# Patient Record
Sex: Female | Born: 1935 | Race: White | Hispanic: No | Marital: Married | Smoking: Never smoker
Health system: Southern US, Community
[De-identification: ages and names within clinical notes are randomized; demographics above are authoritative.]

## PROBLEM LIST (undated history)

## (undated) DIAGNOSIS — F329 Major depressive disorder, single episode, unspecified: Secondary | ICD-10-CM

## (undated) DIAGNOSIS — G43909 Migraine, unspecified, not intractable, without status migrainosus: Secondary | ICD-10-CM

## (undated) DIAGNOSIS — R531 Weakness: Secondary | ICD-10-CM

## (undated) DIAGNOSIS — F419 Anxiety disorder, unspecified: Secondary | ICD-10-CM

## (undated) DIAGNOSIS — R42 Dizziness and giddiness: Secondary | ICD-10-CM

## (undated) DIAGNOSIS — F32A Depression, unspecified: Secondary | ICD-10-CM

## (undated) DIAGNOSIS — IMO0001 Reserved for inherently not codable concepts without codable children: Principal | ICD-10-CM

## (undated) DIAGNOSIS — I519 Heart disease, unspecified: Secondary | ICD-10-CM

## (undated) HISTORY — DX: Dizziness and giddiness: R42

## (undated) HISTORY — DX: Depression, unspecified: F32.A

## (undated) HISTORY — DX: Weakness: R53.1

## (undated) HISTORY — DX: Reserved for inherently not codable concepts without codable children: IMO0001

## (undated) HISTORY — DX: Major depressive disorder, single episode, unspecified: F32.9

## (undated) HISTORY — DX: Migraine, unspecified, not intractable, without status migrainosus: G43.909

## (undated) HISTORY — DX: Heart disease, unspecified: I51.9

## (undated) HISTORY — DX: Anxiety disorder, unspecified: F41.9

## (undated) HISTORY — PX: BACK SURGERY: SHX140

## (undated) HISTORY — PX: NOSE SURGERY: SHX723

---

## 1978-06-09 HISTORY — PX: VAGINAL HYSTERECTOMY: SHX2639

## 1984-06-09 HISTORY — PX: BUNIONECTOMY: SHX129

## 1989-06-09 HISTORY — PX: KNEE SURGERY: SHX244

## 2001-02-12 ENCOUNTER — Ambulatory Visit (HOSPITAL_COMMUNITY): Admission: RE | Admit: 2001-02-12 | Discharge: 2001-02-12 | Payer: Self-pay | Admitting: Neurosurgery

## 2001-02-12 ENCOUNTER — Encounter: Payer: Self-pay | Admitting: Neurosurgery

## 2001-03-23 ENCOUNTER — Encounter: Payer: Self-pay | Admitting: Neurosurgery

## 2001-03-23 ENCOUNTER — Inpatient Hospital Stay (HOSPITAL_COMMUNITY): Admission: RE | Admit: 2001-03-23 | Discharge: 2001-03-25 | Payer: Self-pay | Admitting: Neurosurgery

## 2001-04-29 ENCOUNTER — Encounter: Payer: Self-pay | Admitting: Neurosurgery

## 2001-04-29 ENCOUNTER — Ambulatory Visit (HOSPITAL_COMMUNITY): Admission: RE | Admit: 2001-04-29 | Discharge: 2001-04-29 | Payer: Self-pay | Admitting: Neurosurgery

## 2001-07-23 ENCOUNTER — Encounter: Payer: Self-pay | Admitting: Neurosurgery

## 2001-07-23 ENCOUNTER — Ambulatory Visit (HOSPITAL_COMMUNITY): Admission: RE | Admit: 2001-07-23 | Discharge: 2001-07-23 | Payer: Self-pay | Admitting: Neurosurgery

## 2007-04-20 ENCOUNTER — Ambulatory Visit (HOSPITAL_BASED_OUTPATIENT_CLINIC_OR_DEPARTMENT_OTHER): Admission: RE | Admit: 2007-04-20 | Discharge: 2007-04-20 | Payer: Self-pay | Admitting: Urology

## 2007-04-20 ENCOUNTER — Encounter (INDEPENDENT_AMBULATORY_CARE_PROVIDER_SITE_OTHER): Payer: Self-pay | Admitting: Urology

## 2010-10-22 NOTE — Op Note (Signed)
NAMEDIAMANTE, TRUSZKOWSKI             ACCOUNT NO.:  [id number]   MEDICAL RECORD NO.:  [id number]          PATIENT TYPE:  AMB   LOCATION:  [facility]                         FACILITY:  [facility]   PHYSICIAN:  Jamison Neighbor, M.D.  DATE OF BIRTH:  1932-08-17   DATE OF PROCEDURE:  04/20/2007  DATE OF DISCHARGE:                               OPERATIVE REPORT   SERVICE:  Urology.   PREOPERATIVE DIAGNOSIS:  Interstitial cystitis.   POSTOPERATIVE DIAGNOSIS:  Interstitial cystitis.   PROCEDURE:  1. Cystoscopy.  2. Urethral calibration.  3. Hydrodistention of the bladder.  4. Bladder biopsy with cauterization.  5. Marcaine and Pyridium installation.  6. Marcaine and Kenalog injection.   SURGEON:  Jamison Neighbor, M.D.   ANESTHESIA:  General.   COMPLICATIONS:  None.   DRAINS:  None.   BRIEF HISTORY:  This 78 year old female has a longstanding problems with  what were thought to be bladder infections, although it seems apparent  that this may very well be interstitial cystitis.  The patient had not  had multiple cultures done, and has not had much response to antibiotic  therapy.  We placed on antibiotic prophylaxis, and her urine has not  shown evidence of classic infection, but she still has urgency and  frequency suggesting that she may very well have IC.  The patient has  seen several urologists, and it has been suggested that she might have  this disease, but she never undergone formal testing.   Right now the patient is using antibiotic prophylaxis, as well as  anticholinergics  without much improvement, and for that reason is now  to undergo cystoscopy, and diagnostic hydrodistention.  She understands  the risks and benefits of the procedure, and gave full informed consent.   DESCRIPTION OF PROCEDURE:  After successful induction of general  anesthesia, the patient was placed in the dorsal position, prepped with  Betadine, and draped in the usual sterile fashion.  The patient had no  vault prolapse, but did have a modest cystocele and rectocele although  these and not felt to be particularly symptomatic or important.  The  urethra was of normal caliber accepting a 32-French female urethral  sound with no signs of stenosis or stricture.  The bladder was carefully  inspected.  No tumors or stones could be seen.  Both ureteral orifices  were normal in configuration and location.  Urine seen to come out of  each one was unremarkable.  The bladder was then distended at a pressure  of 170 cm of water for 5 minutes, and the bladder was drained.  Glomerulations could be seen throughout the bladder consistent with IC.  Bladder capacity of 575 mL is almost exactly the same as the average IC  capacity of 550 to 575, and approximately half of a normal bladder  capacity which should be approximate level of 1150 mL.  The patient had  a random biopsy done and the biopsy site was cauterized.   The final inspection showed no evidence of the true Hunter's ulcers, and  no signs of bladder cancer or other irregularities.  The bladder was  drained.  A mixture of Marcaine and Pyridium was left in the bladder.  Marcaine and Kenalog were injected periurethrally.  The patient  tolerated the procedure, and was taken to recovery room in good  condition.      Jamison Neighbor, M.D.  Electronically Signed     RJE/MEDQ  D:  04/20/2007  T:  04/21/2007  Job:  [id number]

## 2010-10-25 NOTE — Op Note (Signed)
[place]. [facility]  Patient:    Yolanda, Franco Visit Number: [id number] MRN: [id number]          Service Type: SUR Location: [id number] Attending Physician:  Donn Pierini Dictated by:   Julio Sicks, M.D. Proc. Date: 03/23/01 Admit Date:  03/23/2001                             Operative Report  PREOPERATIVE DIAGNOSIS:  L4-5 grade 1 degenerative spondylolisthesis with severe stenosis.  POSTOPERATIVE DIAGNOSIS:  L4-5 grade 1 degenerative spondylolisthesis with severe stenosis.  OPERATION PERFORMED:   L4-5 decompressive lumbar laminectomies with foraminotomies.  L4-5 posterior lumbar interbody fusion utilizing tangent wedges and local autograft.  L4-5 posterolateral fusion utilizing pedicle screw instrumentation and local autograft.  SURGEON:  Julio Sicks, M.D.  ASSISTANT:  Donalee Citrin, Montez Hageman.  ANESTHESIA:  General endotracheal.  INDICATIONS FOR PROCEDURE:  Yolanda Franco is a 77 year old female with a history of severe back and bilateral lower extremity pain, left greater than right, failing conservative management.  MRI scanning and CT myelography demonstrate severe stenosis at the L4-5 level secondary to degenerative spondylolisthesis. The patient has been counseled as to her options.  She has decided to proceed with an L4-5 decompression and fusion in hopes of relieving her symptoms.  DESCRIPTION OF PROCEDURE:  The patient was taken to the operating room and placed on the operating table in supine position.  After an adequate level of anesthesia was achieved, the patient was positioned prone onto a Wilson frame and appropriately padded.  The patients lumbar region was shaved and prepped sterilely.  A 10 blade was used to make a linear skin incision overlying the L4-5 interspace.  This was carried down sharply in the midline.  Subperiosteal dissection was performed bilaterally exposing the lamina and facet joints at L4 and L5.  The deep  self-retaining retractor was placed.  Intraoperative fluoroscopy was used and the level was confirmed.  The transverse processes at L4 and L5 were also dissected free.  Decompressive laminectomy at L4-5 was then performed using a Leksell rongeur, Kerrison rongeurs and a high speed drill to completely remove the lamina at L4, completely remove the anterior facets at L4, completely remove the superior facets of L5 and remove the superior one third of the lamina of L5.  All bone was cleaned and used for later autografting.  THe ligamentum flavum was then elevated and resected piecemeal fashion using Kerrison rongeurs.  The underlying thecal sac and exiting L4 and L5 nerve roots were identified and wide foraminotomies were performed along the course of the nerve roots.  Attention was then placed to the interspace.  Epidural venous plexus was then coagulated and cut.  The thecal sac and nerve roots were protected.  Starting first on the left side, the disk was incised with a 15 blade in rectangular fashion.  A wide disk space clean-out was then achieved using pituitary rongeurs and upward angled pituitary rongeurs and Epstein curets.  The procedure was then repeated on the contralateral side again without complication.  The disk space was then sequentially distracted up to 11 mm with an 11 mm distractor left on the patients right side.  With the nerve roots protected, the disk space on the left side was reamed and then cut with a 10 mm tangent chisel.  A 10 mm x  26 mm tangent wedge was then impacted into place and recessed approximately 3 mm  from the posterior cortical surface.  Retractor systems were removed.  X-ray images revealed good position of bone graft with complete reduction in the patients deformity.  The procedure was then repeated on the contralateral side.  Prior to installation of the second wedge, a morselized autograft was impacted into the interspace.  The second wedge was  impacted into place and final x-rays once again revealed good reduction of the patients deformity and good placement of the bone graft.  The pedicles at L4 and L5 were then isolated with fluoroscopic guidance and by using surface landmarks.  Superficial bone overlying the pedicle was removed using high speed drill.  The pedicle was then probed using the pedicle awl.  The pedicle awl tract was probed with a blunt probe and found to be solidly within bone. Each pedicle awl tract was then tapped with a 5.25 mm tap.  Each tapped hole was once again probed and found to be solidly within bone.  At all four locations, 6.75 x 40 mm STRS variable angled pedicle screws were placed.  A short segment of titanium rod was contoured and placed over the screw heads at L4 and L5.  The locking caps were placed on the screw heads.  The locking caps were then engaged in sequential fashion to place the construct under compression.  Final images revealed good position of the bone grafts and hardware in both the lateral and AP planes.  A blunt probe was passed easily along the course of the nerve roots.  There was no evidence of any further compression.  The transverse processes of L4 and L5 were decorticated using the high speed drill.  Morselized autograft was packed posterolaterally for later fusion.  The canal was inspected one final time.  Gelfoam was placed for hemostasis.  A medium Hemovac drain was left in the epidural space.  The wound was then closed in layers with Vicryl sutures.  Steri-Strips and sterile dressing were applied.  There were no apparent complications.  The patient tolerated the procedure well and she returned to the recovery room postoperatively. Gelfoam was placed over the laminotomy defect.  Microscope and retractor system were removed.  Hemostasis in the muscle achieved with electrocautery. The wound was then closed in layers with Vicryl sutures.  Steri-Strips and sterile dressing  were applied.  There were no apparent complications.  The patient tolerated the procedure well and she returned to the recovery room postoperatively.  Dictated by:   Julio Sicks, M.D. Attending Physician:  Donn Pierini DD:  03/23/01 TD:  03/23/01 Job: [id number]

## 2011-03-18 LAB — I-STAT 8, (EC8 V) (CONVERTED LAB)
Acid-Base Excess: 2
BUN: 14
Bicarbonate: 27.6 — ABNORMAL HIGH
Chloride: 106
Glucose, Bld: 91
HCT: 35 — ABNORMAL LOW
Hemoglobin: 11.9 — ABNORMAL LOW
Operator id: 268271
Potassium: 4
Sodium: 141
TCO2: 29
pCO2, Ven: 46.3
pH, Ven: 7.383 — ABNORMAL HIGH

## 2011-09-10 DIAGNOSIS — M069 Rheumatoid arthritis, unspecified: Secondary | ICD-10-CM | POA: Insufficient documentation

## 2012-10-27 ENCOUNTER — Other Ambulatory Visit: Payer: Self-pay | Admitting: *Deleted

## 2012-10-27 ENCOUNTER — Encounter: Payer: Self-pay | Admitting: Neurology

## 2012-10-27 ENCOUNTER — Ambulatory Visit (INDEPENDENT_AMBULATORY_CARE_PROVIDER_SITE_OTHER): Payer: Medicare Other | Admitting: Neurology

## 2012-10-27 VITALS — BP 172/86 | HR 58 | Temp 98.4°F | Ht 65.5 in | Wt 134.0 lb

## 2012-10-27 DIAGNOSIS — R5383 Other fatigue: Secondary | ICD-10-CM

## 2012-10-27 DIAGNOSIS — R259 Unspecified abnormal involuntary movements: Secondary | ICD-10-CM

## 2012-10-27 DIAGNOSIS — R42 Dizziness and giddiness: Secondary | ICD-10-CM

## 2012-10-27 DIAGNOSIS — IMO0001 Reserved for inherently not codable concepts without codable children: Secondary | ICD-10-CM

## 2012-10-27 DIAGNOSIS — R5381 Other malaise: Secondary | ICD-10-CM

## 2012-10-27 DIAGNOSIS — R531 Weakness: Secondary | ICD-10-CM

## 2012-10-27 HISTORY — DX: Dizziness and giddiness: R42

## 2012-10-27 HISTORY — DX: Weakness: R53.1

## 2012-10-27 HISTORY — DX: Reserved for inherently not codable concepts without codable children: IMO0001

## 2012-10-27 NOTE — Patient Instructions (Addendum)
I think overall you are doing fairly well but I do want to suggest a few things today:  Remember to drink plenty of fluid, eat healthy meals and do not skip any meals. Try to eat protein with a every meal and eat a healthy snack such as fruit or nuts in between meals. Try to keep a regular sleep-wake schedule and try to exercise daily, particularly in the form of walking, 20-30 minutes a day, if you can.   As far as your medications are concerned, I would like to suggest no change.    As far as diagnostic testing: MRI brain, carotid Ultrasound, EEG  I would like to see you back in 3 months, sooner if we need to. Please call us with any interim questions, concerns, problems, updates or refill requests.  Please also call us for any test results so we can go over those with you on the phone. Brett Canales is my clinical assistant and will answer any of your questions and relay your messages to me and also relay most of my messages to you.  Our phone number is [phone]. We also have an after hours call service for urgent matters and there is a physician on-call for urgent questions. For any emergencies you know to call 911 or go to the nearest emergency room.

## 2012-10-27 NOTE — Progress Notes (Signed)
Subjective:    Patient ID: Yolanda Franco is a 79 y.o. female.  HPI  Huston Foley, MD, PhD [facility] [address], [address] [address] [place], [place] [address]   Dear Dr. Charlynne Cousins,   I saw your patient, Yolanda Franco, upon your kind request in my neurologic clinic today for initial consultation of her leg weakness and shaking. The patient is unaccompanied today. As you know, Yolanda Franco is a very pleasant 79 year old right-handed woman with an underlying medical history of migraine headaches, food allergies, RA, interstitial cystitis, hyperlipidemia, lumbar spine disease, s/p lumbar spine surgery some 10 years ago, who has been complaining of legs becoming weak with activity and shaky. She states, that she has always been very active and in the past 1-2 months, she has noted, that after she is active for about 1-2 hours, she has had episodes of all over weakness with a drained feeling to the point that she feels her as if her legs will give. She has a cardiologist for heart valve d/s. She also has lost about 25 lb over the course of 2 years, with no loss of appetite, she has also noted easy bruising and some chills, but no fevers, no vomiting, occasional nausea. Never had TIA or stroke symptoms, denying sudden onset of one sided weakness, numbness, tingling, slurring of speech or droopy face, hearing loss, tinnitus, diplopia or visual field cut or monocular loss of vision, and denies recurrent headaches.  The spells come intermittently and she needs to sit down and recover and denies LOC, zoning out, staring spell, twitching, but describes an inner trembling and weakness and while she has not fallen, she would if she did not have anything to hold onto or to sit down. She has no diaphoresis, or CP with these, but has had SOB. She has had a similar episode in church after about 30 s of standing. She denies lightheadedness or vertigo.  She is on Zomig and takes it daily. She  is on amitriptyline for interstitial cystitis, atarax, plaquenil, remeron, baclofen, Xanax, macrodantin, elmiron.  For w/u of her wt loss, she has had a CXR, EKG, echo.   Her current medications are elmiron, zolmitriptan, nitroglycerin when necessary, hydroxy chloroquine, Synthroid, baclofen, Xanax  Her Past Medical History Is Significant For: Past Medical History  Diagnosis Date  . Migraine   . Depression   . Anxiety   . Heart disease     leaking valve    Her Past Surgical History Is Significant For: Past Surgical History  Procedure Laterality Date  . Vaginal hysterectomy  1980  . Bunionectomy Bilateral 1986  . Knee surgery Right 1991    Cartilage  . Nose surgery  mid 70's  . Back surgery  1981, 2002    Her Family History Is Significant For: Family History  Problem Relation Age of Onset  . Stroke Mother   . Other Father     surgery complications  . Migraines    . Arthritis      Her Social History Is Significant For: History   Social History  . Marital Status: Married    Spouse Name: Fayrene Fearing    Number of Children: 3  . Years of Education: 11th   Occupational History  . Retired    Social History Main Topics  . Smoking status: Never Smoker   . Smokeless tobacco: Never Used  . Alcohol Use: No  . Drug Use: No  . Sexually Active: None   Other Topics Concern  .  None   Social History Narrative   Pt lives at home with spouse.   Caffeine Use: Quit in 1994    Her Allergies Are:  Allergies  Allergen Reactions  . Aspirin   . Codeine   . Eggs Or Egg-Derived Products   :   Her Current Medications Are:  Outpatient Encounter Prescriptions as of 10/27/2012  Medication Sig Dispense Refill  . ALPRAZolam (XANAX) 0.25 MG tablet 0.25 mg. 1 tab in morning; 2 tabs @hs       . aspirin-acetaminophen-caffeine (EXCEDRIN MIGRAINE) 250-250-65 MG per tablet Take 1 tablet by mouth every 6 (six) hours as needed for pain.      . baclofen (LIORESAL) 10 MG tablet Take 10 mg by  mouth as needed.       . Cholecalciferol (VITAMIN D-3 PO) Take 1 capsule by mouth daily.      Marland Kitchen ELMIRON 100 MG capsule 100 mg 4 (four) times daily.       . fish oil-omega-3 fatty acids 1000 MG capsule Take 2 g by mouth daily.      . hydroxychloroquine (PLAQUENIL) 200 MG tablet Take 200 mg by mouth 2 (two) times daily.       . hydrOXYzine (ATARAX/VISTARIL) 25 MG tablet Take 25 mg by mouth 3 (three) times daily as needed for itching.      . levothyroxine (SYNTHROID, LEVOTHROID) 25 MCG tablet 25 mcg. 1/2 tab qd      . magnesium 30 MG tablet Take 30 mg by mouth 2 (two) times daily.      . mirtazapine (REMERON) 15 MG tablet Take 15 mg by mouth at bedtime.       . Multiple Vitamin (MULTIVITAMIN) tablet Take 1 tablet by mouth daily.      . Multiple Vitamins-Minerals (ICAPS) CAPS Take 1 capsule by mouth daily.      . naproxen sodium (ANAPROX) 220 MG tablet Take 220 mg by mouth 2 (two) times daily with a meal.      . nitrofurantoin (MACRODANTIN) 50 MG capsule Take 50 mg by mouth at bedtime.       . phenazopyridine (PYRIDIUM) 200 MG tablet Take 200 mg by mouth 3 (three) times daily as needed for pain.      . TURMERIC PO Take 1 tablet by mouth daily.      Marland Kitchen URELLE (URELLE/URISED) 81 MG TABS Take 1 tablet by mouth every 6 (six) hours as needed.      . vitamin B-12 (CYANOCOBALAMIN) 1000 MCG tablet Take 1,000 mcg by mouth daily.      Marland Kitchen zolmitriptan (ZOMIG) 5 MG tablet Take 5 mg by mouth as needed.       . [DISCONTINUED] amitriptyline (ELAVIL) 25 MG tablet       . [DISCONTINUED] hydrOXYzine (ATARAX/VISTARIL) 25 MG tablet        No facility-administered encounter medications on file as of 10/27/2012.  : Review of Systems  Constitutional: Positive for fatigue and unexpected weight change.       Weight loss  Respiratory: Positive for shortness of breath.   Gastrointestinal: Positive for constipation.  Endocrine: Positive for heat intolerance.  Genitourinary:       Urination problems  Musculoskeletal:        Joint pain, joint swelling  Neurological: Positive for tremors, weakness and headaches.  Hematological: Bruises/bleeds easily.  Psychiatric/Behavioral:       Depression, anxiety, not enough sleep, decreased energy, change in appetite, racing thoughts    Objective:  Neurologic Exam  Physical Exam  Physical Examination:   Filed Vitals:   10/27/12 1314  BP: 160/84  Pulse: 61  Temp: 98.4 F (36.9 C)   There is a 14 point systolic BP drop only with standing without Sx.   General Examination: The patient is a very pleasant 79 y.o. female in no acute distress. She appears well-developed and well-nourished and very well groomed.   HEENT: Normocephalic, atraumatic, pupils are equal, round and reactive to light and accommodation. Extraocular tracking is good without limitation to gaze excursion or nystagmus noted. Normal smooth pursuit is noted. Hearing is grossly intact. Tympanic membranes are clear bilaterally. Face is symmetric with normal facial animation and normal facial sensation. Speech is clear with no dysarthria noted. There is no hypophonia. There is no lip, neck/head, jaw or voice tremor. Neck is supple with full range of passive and active motion. There are no carotid bruits on auscultation. Oropharynx exam reveals: adequate dental hygiene and mild airway crowding, due to redundant soft palate. Mallampati is class II. Tongue protrudes centrally and palate elevates symmetrically.   Chest: Clear to auscultation without wheezing, rhonchi or crackles noted.  Heart: S1+S2+0, regular and normal without murmurs, rubs or gallops noted.   Abdomen: Soft, non-tender and non-distended with normal bowel sounds appreciated on auscultation.  Extremities: There is no pitting edema in the distal lower extremities bilaterally. Pedal pulses are intact.  Skin: Warm and dry without trophic changes noted. There are no varicose veins.  Musculoskeletal: exam reveals no obvious joint deformities,  tenderness or joint swelling or erythema.   Neurologically:  Mental status: The patient is awake, alert and oriented in all 4 spheres. Her memory, attention, language and knowledge are appropriate. There is no aphasia, agnosia, apraxia or anomia. Speech is clear with normal prosody and enunciation. Thought process is linear. Mood is congruent and affect is normal.  Cranial nerves are as described above under HEENT exam. In addition, shoulder shrug is normal with equal shoulder height noted. No vertigo is reported with sudden changes in her posture. Motor exam: Normal bulk, strength and tone is noted. There is no drift, tremor or rebound. Romberg is negative. Reflexes are 2+ in the UEs and absent in the LEs. Toes are downgoing bilaterally. Fine motor skills are intact with normal finger taps, normal hand movements, normal rapid alternating patting, normal foot taps and normal foot agility.  Cerebellar testing shows no dysmetria or intention tremor on finger to nose testing. There is no truncal or gait ataxia.  Sensory exam is intact to light touch, pinprick, vibration, temperature sense in the upper and lower extremities with the exception of a slight decrease in sensation around the bunion repair scar of the L big toe.   Gait, station and balance: She stands up with mild difficulty and her L leg is shorter than her R. No veering to one side is noted. No leaning to one side is noted. Posture is age-appropriate and stance is narrow based. No problems turning are noted. She turns in 3 steps. Tandem walk is unremarkable. Intact toe and heel stance is noted, but only briefly. No orthostatic tremor, resting tremor, other abnormal involuntary movements.                Assessment and Plan:   Assessment and Plan:  In summary, Yolanda Franco is a very pleasant 79 y.o.-year old female with a history of trembling and weakness, episodic after being active, up and about. Her exam is actually fairly nonfocal today.  The diagnosis at this point  is unclear to me. This could be orthostatic hypotension versus orthostatic tremor versus exhaustion secondary to physical overactivity. She is taking quite a few neuro and psychotropic medications but has been taking these for years. At the structure would like to proceed with a brain MRI, neck artery ultrasound and EEG. She is advised to be cautious when physically active and not to overdo it. She is advised to always wear a sun hat and keep well hydrated and not do any physical activity over an hour at a time. She is to take resting use caution and her good judgment. I would like to reevaluate her in 3 months, sooner if the need arises in the interim we will be calling her with the test results as they come back. She was in agreement.  I did not suggest any new medications to her today but did ask her to be very cautious with the use of Zomig and not to use it every day.  Thank you very much for allowing me to participate in the care of this nice patient. If I can be of any further assistance to you please do not hesitate to call me at [phone].  Sincerely,   Huston Foley, MD, PhD

## 2012-11-02 ENCOUNTER — Ambulatory Visit (INDEPENDENT_AMBULATORY_CARE_PROVIDER_SITE_OTHER): Payer: Medicare Other

## 2012-11-02 DIAGNOSIS — R531 Weakness: Secondary | ICD-10-CM

## 2012-11-02 DIAGNOSIS — R42 Dizziness and giddiness: Secondary | ICD-10-CM

## 2012-11-02 DIAGNOSIS — IMO0001 Reserved for inherently not codable concepts without codable children: Secondary | ICD-10-CM

## 2012-11-09 ENCOUNTER — Ambulatory Visit
Admission: RE | Admit: 2012-11-09 | Discharge: 2012-11-09 | Disposition: A | Discharge status: Home / Self Care | Payer: Medicare Other | Source: Ambulatory Visit | Attending: Neurology | Admitting: Neurology

## 2012-11-09 DIAGNOSIS — R259 Unspecified abnormal involuntary movements: Secondary | ICD-10-CM

## 2012-11-09 DIAGNOSIS — R42 Dizziness and giddiness: Secondary | ICD-10-CM

## 2012-11-09 DIAGNOSIS — R531 Weakness: Secondary | ICD-10-CM

## 2012-11-09 DIAGNOSIS — IMO0001 Reserved for inherently not codable concepts without codable children: Secondary | ICD-10-CM

## 2012-11-12 NOTE — Progress Notes (Signed)
Quick Note:  Please call and advise the patient that the recent scan we did was within normal limits. We did a brain MRI without contrast which showed normal findings. In particular, there were no acute findings, such as a stroke, or mass or blood products. No further action is required on this test at this time. Please remind patient to keep any upcoming appointments or tests and to call us with any interim questions, concerns, problems or updates. Thanks,  Seddrick Flax, MD, PhD   ______ 

## 2012-11-16 ENCOUNTER — Ambulatory Visit (INDEPENDENT_AMBULATORY_CARE_PROVIDER_SITE_OTHER): Payer: Medicare Other | Admitting: Radiology

## 2012-11-16 DIAGNOSIS — R42 Dizziness and giddiness: Secondary | ICD-10-CM

## 2012-11-16 DIAGNOSIS — IMO0001 Reserved for inherently not codable concepts without codable children: Secondary | ICD-10-CM

## 2012-11-16 DIAGNOSIS — R531 Weakness: Secondary | ICD-10-CM

## 2012-11-16 NOTE — Progress Notes (Signed)
Quick Note:  Called and spoke to patient about her MRI. Normal Limits no acute findings such as stroke or mass or blood clot. Told patient to keep her follow up apt and told her when her apt was. Patient understood. ______

## 2012-11-19 NOTE — Procedures (Signed)
HISTORY: 79 year old female, with history of migraine, now presenting with episodes of trembling, weakness, no loss of consciousness.  TECHNIQUE:  16 channel EEG was performed based on standard 10-16 international system. One channel was dedicated to EKG, which has demonstrates normal sinus rhythm of 60 beats per minutes.  Upon awakening, the posterior background activity was well-developed, in alpha range 10 Hz,, with amplitude of 15 microvoltage, reactive to eye opening and closure.  There was no evidence of epilepsy for discharge.  Photic stimulation was performed, which induced a symmetric photic driving.  Hyperventilation was not performed  Patient was able to achieve stage II sleep, as evident by sleep spindles and K complex.  CONCLUSION: This is a  normal awake and asleep EEG.  There is no electrodiagnostic evidence of epileptiform discharge

## 2012-11-20 NOTE — Progress Notes (Signed)
Quick Note:  Normal EEG brain , please call patient. ______

## 2012-11-22 NOTE — Progress Notes (Signed)
Quick Note:  Left a message on the pt's home voice mail (vm was in the patient's voice and she stated her name) regarding her recent EEG being within normal limits. Contact information was given so that she may call with any questions or concerns.   ______

## 2013-01-26 ENCOUNTER — Ambulatory Visit (INDEPENDENT_AMBULATORY_CARE_PROVIDER_SITE_OTHER): Payer: Medicare Other | Admitting: Neurology

## 2013-01-26 ENCOUNTER — Encounter: Payer: Self-pay | Admitting: Neurology

## 2013-01-26 VITALS — BP 133/67 | HR 61 | Temp 97.2°F | Ht 65.5 in | Wt 135.0 lb

## 2013-01-26 DIAGNOSIS — R531 Weakness: Secondary | ICD-10-CM

## 2013-01-26 DIAGNOSIS — R5381 Other malaise: Secondary | ICD-10-CM

## 2013-01-26 DIAGNOSIS — R51 Headache: Secondary | ICD-10-CM

## 2013-01-26 DIAGNOSIS — G250 Essential tremor: Secondary | ICD-10-CM

## 2013-01-26 DIAGNOSIS — G252 Other specified forms of tremor: Secondary | ICD-10-CM

## 2013-01-26 NOTE — Patient Instructions (Signed)
I think overall you are doing fairly well and are stable at this point.   I do have some generic suggestions for you today:  Please make sure that you drink plenty of fluids. I would like for you to exercise daily for example in the form of walking 20-30 minutes every day, if you can. Please keep a regular sleep-wake schedule, keep regular meal times, do not skip any meals, eat  healthy snacks in between meals, such as fruit or nuts. Try to eat protein with every meal.   As far as your medications are concerned, I would like to suggest: no changes.   Please remember, common headache triggers are: sleep deprivation, dehydration, overheating, stress, hypoglycemia or skipping meals and blood sugar fluctuations, excessive pain medications or excessive alcohol use or caffeine withdrawal. Some people have food triggers such as aged cheese, orange juice or chocolate, especially dark chocolate, or MSG (monosodium glutamate). Try to avoid these headache triggers as much possible. It may be helpful to keep a headache diary to figure out what makes your headaches worse or brings them on and what alleviates them. Some people report headache onset after exercise but studies have shown that regular exercise may actually prevent headaches from coming. If you have exercise-induced headaches, please make sure that you drink plenty of fluid before and after exercising and that you do not over do it and do not overheat.  I would like for you to use a cane at all times. If need be, you may use a walker.   I do not think we need to make any changes in your medications at this point. I think you're stable enough that I can see you back as needed.

## 2013-01-26 NOTE — Progress Notes (Signed)
Subjective:    Patient ID: Yolanda Franco is a 79 y.o. female.  HPI  Interim history:   [name] is a very pleasant 79 year old right-handed woman who presents for followup consultation of her intermittent, episodic trembling and weakness episodes. I first met her on 07/30/2012. She is unaccompanied today. She has an underlying medical history of migraine headaches, food allergies, rheumatoid arthritis, interstitial cystitis, hyperlipidemia, lumbar spine disease, status post lumbar spine surgery, who had been complaining of leg weakness with prolonged activity as well as like shakiness with prolonged standing or activity. This has been going on for the past couple months prior to her in initial visit. At the time of her first appointment I suggested further workup with a brain MRI, carotid Doppler studies and EEG. I felt that her differential diagnoses would include orthostatic hypotension, orthostatic tremor, and overall physical exertion. I went over her test results with her in detail today. Her carotid Doppler testing from 11/02/2012 showed no hemodynamically significant stenosis in either carotid arteries, her brain MRI without contrast from 11/09/2012 was reported as normal, her EEG from 11/19/2012 was reported as normal in the awake and sleep states. She overall feels a little improved since I first met her. She has had no falls. She has a cane that she keeps in her car. She does not have a walker. For her migraine headaches she takes Zomig as needed. Her urologist has placed her on Elavil for her bladder and she takes 25 mg at night.  Her Past Medical History Is Significant For: Past Medical History  Diagnosis Date  . Migraine   . Depression   . Anxiety   . Heart disease     leaking valve  . Dizzy spells 10/27/2012  . Shaking spells 10/27/2012  . Weakness 10/27/2012    Her Past Surgical History Is Significant For: Past Surgical History  Procedure Laterality Date  . Vaginal  hysterectomy  1980  . Bunionectomy Bilateral 1986  . Knee surgery Right 1991    Cartilage  . Nose surgery  mid 70's  . Back surgery  1981, 2002    Her Family History Is Significant For: Family History  Problem Relation Age of Onset  . Stroke Mother   . Other Father     surgery complications  . Migraines    . Arthritis      Her Social History Is Significant For: History   Social History  . Marital Status: Married    Spouse Name: Fayrene Fearing    Number of Children: 3  . Years of Education: 11th   Occupational History  . Retired    Social History Main Topics  . Smoking status: Never Smoker   . Smokeless tobacco: Never Used  . Alcohol Use: No  . Drug Use: No  . Sexual Activity: None   Other Topics Concern  . None   Social History Narrative   Pt lives at home with spouse.   Caffeine Use: Quit in 1994    Her Allergies Are:  Allergies  Allergen Reactions  . Aspirin   . Codeine   . Eggs Or Egg-Derived Products   :   Her Current Medications Are:  Outpatient Encounter Prescriptions as of 01/26/2013  Medication Sig Dispense Refill  . ALPRAZolam (XANAX) 0.25 MG tablet 0.25 mg. 1 tab in morning; 2 tabs @hs       . aspirin-acetaminophen-caffeine (EXCEDRIN MIGRAINE) 250-250-65 MG per tablet Take 1 tablet by mouth every 6 (six) hours as needed for pain.      Marland Kitchen  baclofen (LIORESAL) 10 MG tablet Take 10 mg by mouth as needed.       . Cholecalciferol (VITAMIN D-3 PO) Take 1 capsule by mouth daily.      Marland Kitchen ELMIRON 100 MG capsule 100 mg 4 (four) times daily.       . fish oil-omega-3 fatty acids 1000 MG capsule Take 2 g by mouth daily.      . hydroxychloroquine (PLAQUENIL) 200 MG tablet Take 200 mg by mouth 2 (two) times daily.       Marland Kitchen levothyroxine (SYNTHROID, LEVOTHROID) 25 MCG tablet 25 mcg. 1/2 tab qd      . Multiple Vitamins-Minerals (ICAPS) CAPS Take 1 capsule by mouth daily.      . nitrofurantoin (MACRODANTIN) 50 MG capsule Take 50 mg by mouth at bedtime.       . TURMERIC PO  Take 1 tablet by mouth daily.      Marland Kitchen URELLE (URELLE/URISED) 81 MG TABS Take 1 tablet by mouth every 6 (six) hours as needed.      . vitamin B-12 (CYANOCOBALAMIN) 1000 MCG tablet Take 1,000 mcg by mouth daily.      Marland Kitchen zolmitriptan (ZOMIG) 5 MG tablet Take 5 mg by mouth as needed.       . [DISCONTINUED] amitriptyline (ELAVIL) 25 MG tablet Take 1 tablet by mouth daily.      . [DISCONTINUED] hydrOXYzine (ATARAX/VISTARIL) 25 MG tablet Take 25 mg by mouth 3 (three) times daily as needed for itching.      . [DISCONTINUED] magnesium 30 MG tablet Take 30 mg by mouth 2 (two) times daily.      . [DISCONTINUED] mirtazapine (REMERON) 15 MG tablet Take 15 mg by mouth at bedtime.       . [DISCONTINUED] Multiple Vitamin (MULTIVITAMIN) tablet Take 1 tablet by mouth daily.      . [DISCONTINUED] naproxen sodium (ANAPROX) 220 MG tablet Take 220 mg by mouth 2 (two) times daily with a meal.      . [DISCONTINUED] phenazopyridine (PYRIDIUM) 200 MG tablet Take 200 mg by mouth 3 (three) times daily as needed for pain.       No facility-administered encounter medications on file as of 01/26/2013.   Review of Systems  HENT:       Ringing in ears  Respiratory:       Snoring  Gastrointestinal: Positive for constipation.  Musculoskeletal: Positive for joint swelling and arthralgias.  Neurological: Positive for headaches.  Psychiatric/Behavioral:       Anxiety Decreased energy Change in appetite    Objective:  Neurologic Exam  Physical Exam Physical Examination:   Filed Vitals:   01/26/13 1144  BP: 133/67  Pulse: 61  Temp: 97.2 F (36.2 C)   General Examination: The patient is a very pleasant 79 y.o. female in no acute distress. She appears well-developed and well-nourished and well groomed.   HEENT: Normocephalic, atraumatic, pupils are equal, round and reactive to light and accommodation. Extraocular tracking is good without limitation to gaze excursion or nystagmus noted. Normal smooth pursuit is noted.  Hearing is grossly intact. Face is symmetric with normal facial animation and normal facial sensation. Speech is clear with no dysarthria noted. There is no hypophonia. There is no lip, neck/head, jaw or voice tremor. Neck is supple with full range of passive and active motion. There are no carotid bruits on auscultation. Oropharynx exam reveals: adequate dental hygiene and mild airway crowding, due to redundant soft palate. Mallampati is class II. Tongue protrudes centrally and  palate elevates symmetrically.   Chest: Clear to auscultation without wheezing, rhonchi or crackles noted.  Heart: S1+S2+0, regular and normal without murmurs, rubs or gallops noted.   Abdomen: Soft, non-tender and non-distended with normal bowel sounds appreciated on auscultation.  Extremities: There is no pitting edema in the distal lower extremities bilaterally. Pedal pulses are intact.  Skin: Warm and dry without trophic changes noted. There are no varicose veins.  Musculoskeletal: exam reveals no obvious joint deformities, tenderness or joint swelling or erythema.   Neurologically:  Mental status: The patient is awake, alert and oriented in all 4 spheres. Her memory, attention, language and knowledge are appropriate. There is no aphasia, agnosia, apraxia or anomia. Speech is clear with normal prosody and enunciation. Thought process is linear. Mood is congruent and affect is normal.  Cranial nerves are as described above under HEENT exam. In addition, shoulder shrug is normal with equal shoulder height noted. No vertigo is reported with sudden changes in her posture. Motor exam: Normal bulk, strength and tone is noted. There is no drift, tremor or rebound. Romberg is negative, but she has minimal swaying. Reflexes are 2+ in the UEs, trace in both knees and absent in the ankles. Fine motor skills are intact with normal finger taps, normal hand movements, normal rapid alternating patting, normal foot taps and normal foot  agility.  Cerebellar testing shows no dysmetria or intention tremor on finger to nose testing. There is no truncal or gait ataxia.  Sensory exam is intact to light touch, pinprick, vibration, temperature sense in the upper and lower extremities.  Gait, station and balance: She stands up with mild difficulty and her L leg is shorter than her R. No veering to one side is noted. No leaning to one side is noted. Posture is age-appropriate and stance is narrow based. No problems turning are noted. She turns in 3 steps. Tandem walk is unremarkable.     Assessment and Plan:   In summary, Benicia Bergevin is a very pleasant 79 y.o.-year old female with a history of trembling and weakness, episodic after being active, up and about. Her exam is stable and her history is most suggestive of orthostatic tremor. I told her that there's not a whole lot of medication options for this. There is no cure for this typically. Sometimes we utilize a beta blocker but since her pulse rate is in the low 60s I would shy away from this. Her w/u included a normal brain MRI, neck artery ultrasound and EEG. She is advised to be cautious when physically active and not to overdo it and to use a cane at all times. For prolonged walking of standing she may be better off using a rolling walker with a seat. I offered her a prescription for this but she declined at this time. For her migraines she is using Zomig with success but is advised to use it with caution. She can continue with Elavil as prescribed by her urologist which would also help with headache prevention. At this juncture I believe she is stable enough to followup with me on an as-needed basis. She was in agreement.

## 2013-01-27 ENCOUNTER — Ambulatory Visit: Payer: Medicare Other | Admitting: Neurology

## 2013-05-18 ENCOUNTER — Other Ambulatory Visit: Payer: Self-pay | Admitting: Neurosurgery

## 2013-05-18 DIAGNOSIS — M5416 Radiculopathy, lumbar region: Secondary | ICD-10-CM

## 2013-05-30 ENCOUNTER — Ambulatory Visit
Admission: RE | Admit: 2013-05-30 | Discharge: 2013-05-30 | Disposition: A | Discharge status: Home / Self Care | Payer: 59 | Source: Ambulatory Visit | Attending: Neurosurgery | Admitting: Neurosurgery

## 2013-05-30 DIAGNOSIS — M5416 Radiculopathy, lumbar region: Secondary | ICD-10-CM

## 2013-11-02 ENCOUNTER — Encounter (HOSPITAL_COMMUNITY): Payer: Self-pay | Admitting: Emergency Medicine

## 2013-11-02 ENCOUNTER — Emergency Department (HOSPITAL_COMMUNITY): Payer: Medicare Other

## 2013-11-02 ENCOUNTER — Emergency Department (HOSPITAL_COMMUNITY)
Admission: EM | Admit: 2013-11-02 | Discharge: 2013-11-02 | Disposition: A | Discharge status: Home / Self Care | Payer: Medicare Other | Attending: Emergency Medicine | Admitting: Emergency Medicine

## 2013-11-02 DIAGNOSIS — Z8739 Personal history of other diseases of the musculoskeletal system and connective tissue: Secondary | ICD-10-CM | POA: Insufficient documentation

## 2013-11-02 DIAGNOSIS — Z87448 Personal history of other diseases of urinary system: Secondary | ICD-10-CM | POA: Insufficient documentation

## 2013-11-02 DIAGNOSIS — G43909 Migraine, unspecified, not intractable, without status migrainosus: Secondary | ICD-10-CM | POA: Insufficient documentation

## 2013-11-02 DIAGNOSIS — F411 Generalized anxiety disorder: Secondary | ICD-10-CM | POA: Insufficient documentation

## 2013-11-02 DIAGNOSIS — R0609 Other forms of dyspnea: Secondary | ICD-10-CM | POA: Insufficient documentation

## 2013-11-02 DIAGNOSIS — Z7982 Long term (current) use of aspirin: Secondary | ICD-10-CM | POA: Insufficient documentation

## 2013-11-02 DIAGNOSIS — F329 Major depressive disorder, single episode, unspecified: Secondary | ICD-10-CM | POA: Insufficient documentation

## 2013-11-02 DIAGNOSIS — F3289 Other specified depressive episodes: Secondary | ICD-10-CM | POA: Insufficient documentation

## 2013-11-02 DIAGNOSIS — R0989 Other specified symptoms and signs involving the circulatory and respiratory systems: Principal | ICD-10-CM | POA: Insufficient documentation

## 2013-11-02 DIAGNOSIS — R0600 Dyspnea, unspecified: Secondary | ICD-10-CM

## 2013-11-02 DIAGNOSIS — Z79899 Other long term (current) drug therapy: Secondary | ICD-10-CM | POA: Insufficient documentation

## 2013-11-02 LAB — CBC WITH DIFFERENTIAL/PLATELET
BASOS ABS: 0 10*3/uL (ref 0.0–0.1)
Basophils Relative: 1 % (ref 0–1)
EOS PCT: 1 % (ref 0–5)
Eosinophils Absolute: 0.1 10*3/uL (ref 0.0–0.7)
HCT: 36.9 % (ref 36.0–46.0)
Hemoglobin: 12.3 g/dL (ref 12.0–15.0)
LYMPHS PCT: 30 % (ref 12–46)
Lymphs Abs: 1.4 10*3/uL (ref 0.7–4.0)
MCH: 30.9 pg (ref 26.0–34.0)
MCHC: 33.3 g/dL (ref 30.0–36.0)
MCV: 92.7 fL (ref 78.0–100.0)
Monocytes Absolute: 0.5 10*3/uL (ref 0.1–1.0)
Monocytes Relative: 10 % (ref 3–12)
NEUTROS ABS: 2.8 10*3/uL (ref 1.7–7.7)
NEUTROS PCT: 58 % (ref 43–77)
Platelets: 189 10*3/uL (ref 150–400)
RBC: 3.98 MIL/uL (ref 3.87–5.11)
RDW: 13 % (ref 11.5–15.5)
WBC: 4.7 10*3/uL (ref 4.0–10.5)

## 2013-11-02 LAB — COMPREHENSIVE METABOLIC PANEL
ALK PHOS: 52 U/L (ref 39–117)
ALT: 22 U/L (ref 0–35)
AST: 25 U/L (ref 0–37)
Albumin: 3.9 g/dL (ref 3.5–5.2)
BUN: 27 mg/dL — ABNORMAL HIGH (ref 6–23)
CALCIUM: 10.1 mg/dL (ref 8.4–10.5)
CHLORIDE: 107 meq/L (ref 96–112)
CO2: 26 meq/L (ref 19–32)
Creatinine, Ser: 0.93 mg/dL (ref 0.50–1.10)
GFR calc Af Amer: 66 mL/min — ABNORMAL LOW (ref >90)
GFR calc non Af Amer: 57 mL/min — ABNORMAL LOW (ref >90)
Glucose, Bld: 158 mg/dL — ABNORMAL HIGH (ref 70–99)
POTASSIUM: 4.6 meq/L (ref 3.7–5.3)
SODIUM: 144 meq/L (ref 137–147)
Total Bilirubin: <0.2 mg/dL — ABNORMAL LOW (ref 0.3–1.2)
Total Protein: 6.9 g/dL (ref 6.0–8.3)

## 2013-11-02 LAB — PRO B NATRIURETIC PEPTIDE: Pro B Natriuretic peptide (BNP): 126.2 pg/mL (ref 0–450)

## 2013-11-02 LAB — TROPONIN I: Troponin I: <0.3 ng/mL (ref <0.30)

## 2013-11-02 MED ORDER — IOHEXOL 350 MG/ML SOLN
80.0000 mL | Freq: Once | INTRAVENOUS | Status: AC | PRN
  Administered 2013-11-02: 65 mL via INTRAVENOUS

## 2013-11-02 MED ORDER — SODIUM CHLORIDE 0.9 % IV BOLUS (SEPSIS)
1000.0000 mL | Freq: Once | INTRAVENOUS | Status: AC
  Administered 2013-11-02: 1000 mL via INTRAVENOUS

## 2013-11-02 NOTE — ED Notes (Addendum)
Pt. reports progressing SOB and generalized weakness for 2 months , seen by her cardiologist at [facility] today chest x-ray / CT chest done advised to go to ER due to " suspected blood clot in lung " - pt.'s x-ray/CT  images with pt. In a CD . Denies chest pain .

## 2013-11-02 NOTE — Discharge Instructions (Signed)
Your labs and vital signs today were normal. Your EKG showed no concerning signs. Your CT scan showed no blood clots, infection, fluid on her lungs or other abnormality. Given you have had symptoms of shortness of breath for 3 months, I do not feel there is any life-threatening illness present but feel you should followup with your primary care physician and cardiologist.   Shortness of Breath Shortness of breath means you have trouble breathing. Shortness of breath may indicate that you have a medical problem. You should seek immediate medical care for shortness of breath. CAUSES   Not enough oxygen in the air (as with high altitudes or a smoke-filled room).  Short-term (acute) lung disease, including:  Infections, such as pneumonia.  Fluid in the lungs, such as heart failure.  A blood clot in the lungs (pulmonary embolism).  Long-term (chronic) lung diseases.  Heart disease (heart attack, angina, heart failure, and others).  Low red blood cells (anemia).  Poor physical fitness. This can cause shortness of breath when you exercise.  Chest or back injuries or stiffness.  Being overweight.  Smoking.  Anxiety. This can make you feel like you are not getting enough air. DIAGNOSIS  Serious medical problems can usually be found during your physical exam. Tests may also be done to determine why you are having shortness of breath. Tests may include:  Chest X-rays.  Lung function tests.  Blood tests.  Electrocardiography.  Exercise testing.  Echocardiography.  Imaging scans. Your caregiver may not be able to find a cause for your shortness of breath after your exam. In this case, it is important to have a follow-up exam with your caregiver as directed.  TREATMENT  Treatment for shortness of breath depends on the cause of your symptoms and can vary greatly. HOME CARE INSTRUCTIONS   Do not smoke. Smoking is a common cause of shortness of breath. If you smoke, ask for help  to quit.  Avoid being around chemicals or things that may bother your breathing, such as paint fumes and dust.  Rest as needed. Slowly resume your usual activities.  If medicines were prescribed, take them as directed for the full length of time directed. This includes oxygen and any inhaled medicines.  Keep all follow-up appointments as directed by your caregiver. SEEK MEDICAL CARE IF:   Your condition does not improve in the time expected.  You have a hard time doing your normal activities even with rest.  You have any side effects or problems with the medicines prescribed.  You develop any new symptoms. SEEK IMMEDIATE MEDICAL CARE IF:   Your shortness of breath gets worse.  You feel lightheaded, faint, or develop a cough not controlled with medicines.  You start coughing up blood.  You have pain with breathing.  You have chest pain or pain in your arms, shoulders, or abdomen.  You have a fever.  You are unable to walk up stairs or exercise the way you normally do. MAKE SURE YOU:  Understand these instructions.  Will watch your condition.  Will get help right away if you are not doing well or get worse. Document Released: 02/18/2001 Document Revised: 11/25/2011 Document Reviewed: 08/11/2011 [facility] Patient Information 2014 [employer].

## 2013-11-02 NOTE — ED Notes (Signed)
This RN walked pt's CDROM of VQ scan and CXR over to radiology to be read.

## 2013-11-02 NOTE — ED Notes (Signed)
Patient currently in CT °

## 2013-11-02 NOTE — ED Provider Notes (Signed)
TIME SEEN: 8:30 PM  CHIEF COMPLAINT: Shortness of breath for 3 months  HPI: Patient is a 80 year old female with history of migraines, depression, anxiety, "leaky heart valve", interstitial cystitis, rheumatoid arthritis who presents to the emergency department with complaints of shortness of breath for 3 months. Shortness of breath is worse with exertion and better with rest. She denies any chest pain or chest discomfort. No fever but did have chills last night. No nausea, vomiting or diarrhea. No bloody stool or melena. No cough. She states she was seen by her cardiologist Dr. Genene Churn in [place] who ordered a chest x-ray and a "lung scan". She states that she was called by the radiologist today and her cardiologist who instructed her to come to the closest emergency department because she had an abnormality on her imaging. She is not sure what this abnormality was or if he was on her chest x-ray or CT scan. She states that she did not want to go [facility] and drove here to [facility].  ROS: See HPI Constitutional: no fever  Eyes: no drainage  ENT: no runny nose   Cardiovascular:  no chest pain  Resp:  SOB  GI: no vomiting GU: no dysuria Integumentary: no rash  Allergy: no hives  Musculoskeletal: no leg swelling  Neurological: no slurred speech ROS otherwise negative  PAST MEDICAL HISTORY/PAST SURGICAL HISTORY:  Past Medical History  Diagnosis Date  . Migraine   . Depression   . Anxiety   . Heart disease     leaking valve  . Dizzy spells 10/27/2012  . Shaking spells 10/27/2012  . Weakness 10/27/2012    MEDICATIONS:  Prior to Admission medications   Medication Sig Start Date End Date Taking? Authorizing Provider  ALPRAZolam Duanne Moron) 0.25 MG tablet 0.25 mg. 1 tab in morning; 2 tabs @hs  10/19/12   Historical Provider, MD  aspirin-acetaminophen-caffeine (EXCEDRIN MIGRAINE) [phone] MG per tablet Take 1 tablet by mouth every 6 (six) hours as needed for pain.     Historical Provider, MD  baclofen (LIORESAL) 10 MG tablet Take 10 mg by mouth as needed.  09/29/12   Historical Provider, MD  Cholecalciferol (VITAMIN D-3 PO) Take 1 capsule by mouth daily.    Historical Provider, MD  ELMIRON 100 MG capsule 100 mg 4 (four) times daily.  10/13/12   Historical Provider, MD  fish oil-omega-3 fatty acids 1000 MG capsule Take 2 g by mouth daily.    Historical Provider, MD  hydroxychloroquine (PLAQUENIL) 200 MG tablet Take 200 mg by mouth 2 (two) times daily.  09/03/12   Historical Provider, MD  levothyroxine (SYNTHROID, LEVOTHROID) 25 MCG tablet 25 mcg. 1/2 tab qd 08/23/12   Historical Provider, MD  Multiple Vitamins-Minerals (ICAPS) CAPS Take 1 capsule by mouth daily.    Historical Provider, MD  nitrofurantoin (MACRODANTIN) 50 MG capsule Take 50 mg by mouth at bedtime.  08/16/12   Historical Provider, MD  TURMERIC PO Take 1 tablet by mouth daily.    Historical Provider, MD  URELLE (URELLE/URISED) 81 MG TABS Take 1 tablet by mouth every 6 (six) hours as needed.    Historical Provider, MD  vitamin B-12 (CYANOCOBALAMIN) 1000 MCG tablet Take 1,000 mcg by mouth daily.    Historical Provider, MD  zolmitriptan (ZOMIG) 5 MG tablet Take 5 mg by mouth as needed.  10/20/12   Historical Provider, MD    ALLERGIES:  Allergies  Allergen Reactions  . Aspirin   . Codeine   . Eggs Or Egg-Derived  Products     SOCIAL HISTORY:  History  Substance Use Topics  . Smoking status: Never Smoker   . Smokeless tobacco: Never Used  . Alcohol Use: No    FAMILY HISTORY: Family History  Problem Relation Age of Onset  . Stroke Mother   . Other Father     surgery complications  . Migraines    . Arthritis      EXAM: BP 144/64  Pulse 90  Temp(Src) 97.8 F (36.6 C) (Oral)  Resp 21  SpO2 99% CONSTITUTIONAL: Alert and oriented and responds appropriately to questions. Well-appearing; well-nourished HEAD: Normocephalic EYES: Conjunctivae clear, PERRL ENT: normal nose; no rhinorrhea;  moist mucous membranes; pharynx without lesions noted NECK: Supple, no meningismus, no LAD  CARD: RRR; S1 and S2 appreciated; no murmurs, no clicks, no rubs, no gallops RESP: Normal chest excursion without splinting or tachypnea; breath sounds clear and equal bilaterally; no wheezes, no rhonchi, no rales, no hypoxia, no respiratory distress ABD/GI: Normal bowel sounds; non-distended; soft, non-tender, no rebound, no guarding BACK:  The back appears normal and is non-tender to palpation, there is no CVA tenderness EXT: Normal ROM in all joints; non-tender to palpation; no edema; normal capillary refill; no cyanosis    SKIN: Normal color for age and race; warm NEURO: Moves all extremities equally PSYCH: The patient's mood and manner are appropriate. Grooming and personal hygiene are appropriate.  MEDICAL DECISION MAKING: Patient here shortness of breath for 3 months. She reports she had a abnormal result on imaging today at [facility]. She presents to the emergency department by recommendation of her cardiologist and radiologist in [place]. She has no hypoxia or respiratory distress. Her lungs are clear to auscultation. She denies a history of tobacco use, COPD, asthma, CHF, cardiac disease, PE or DVT. No infectious symptoms. She is however immunocompromised as she is on Plaquenil for rheumatoid arthritis. We'll obtain cardiac labs and repeat a chest x-ray. Discussed with Dr. Thornton Papas with radiology here at [facility] who will look at her imaging but cannot put in an official read given that was done at an outside facility. We'll repeat her chest x-ray here today. We'll attempt to get the radiology read from [facility].  ED PROGRESS: Discussed with our radiologist she reports her chest x-ray shows COPD changes but no other infiltrate, edema or pneumothorax. She does have a torturous aorta. Her VQ scan that was done today showed possible mismatch at the right  lateral segment but he does not feel this is do to pulmonary embolus and would call this a low probability scan. We'll radiology recommends of her creatinine is normal that she obtain a CT with IV contrast to rule out PE.  9:51 PM  Pt's report from [facility] states patient has a moderate mismatched perfusion abnormality within the posterior mid right lung on LPO position and a moderate to large defect in the posterior upper left lung. A small matched defect of the right lateral lower lung is noted on anterior view. Impression: Intermediate probability for pulmonary embolism.  Radiologist is [facility]   11:22 PM  Pt's CT scan shows no pulmonary embolus. Her labs are unremarkable including troponin and BNP. There is minimal bibasilar atelectasis on her CT scan but no infiltrate or edema. She has been able to ambulate without hypoxia or respiratory distress. Given this is a chronic condition, do not feel she has further workup in the emergency department. Have discussed with patient and her son I  recommended she followup with her cardiologist I feel she is safe to be discharged home. They verbalize understanding and are comfortable with plan.   EKG Interpretation  Date/Time:  Wednesday Nov 02 2013 19:57:43 EDT Ventricular Rate:  83 PR Interval:    QRS Duration: 92 QT Interval:  366 QTC Calculation: 430 R Axis:   3 Text Interpretation:  NSR with sinus arrhythmia Otherwise normal ECG Confirmed by Graelyn Bihl,  DO, Maycie Luera [phone]) on 11/02/2013 8:27:57 PM         [place], DO 11/02/13 2324

## 2013-11-02 NOTE — ED Notes (Signed)
MD at bedside. 

## 2015-06-12 ENCOUNTER — Other Ambulatory Visit: Payer: Self-pay | Admitting: Dermatology

## 2020-08-29 ENCOUNTER — Ambulatory Visit: Payer: Medicare Other | Admitting: Dermatology

## 2020-08-29 ENCOUNTER — Other Ambulatory Visit: Payer: Self-pay

## 2020-08-29 ENCOUNTER — Encounter: Payer: Self-pay | Admitting: Dermatology

## 2020-08-29 DIAGNOSIS — D1801 Hemangioma of skin and subcutaneous tissue: Secondary | ICD-10-CM | POA: Diagnosis not present

## 2020-08-29 DIAGNOSIS — D485 Neoplasm of uncertain behavior of skin: Secondary | ICD-10-CM

## 2020-08-29 DIAGNOSIS — L821 Other seborrheic keratosis: Secondary | ICD-10-CM | POA: Diagnosis not present

## 2020-08-29 DIAGNOSIS — Z1283 Encounter for screening for malignant neoplasm of skin: Secondary | ICD-10-CM | POA: Diagnosis not present

## 2020-08-29 NOTE — Patient Instructions (Signed)

## 2020-09-10 ENCOUNTER — Encounter: Payer: Self-pay | Admitting: Dermatology

## 2020-09-10 NOTE — Progress Notes (Signed)
   New Patient   Subjective  Yolanda Franco is a 87 y.o. female who presents for the following: Skin Problem (Check back- lots of moles & bra rubs & mid chest- bra hits).  General skin examination, months spot on back may have changed Location:  Duration:  Quality:  Associated Signs/Symptoms: Modifying Factors:  Severity:  Timing: Context:    The following portions of the chart were reviewed this encounter and updated as appropriate:  Tobacco  Allergies  Meds  Problems  Med Hx  Surg Hx  Fam Hx      Objective  Well appearing patient in no apparent distress; mood and affect are within normal limits. Objective  Chest - Medial [facility]): Skin check general skin check (areas beneath undergarments not fully examined): No atypical pigmented lesions.  Objective  Right Abdomen (side) - Upper: Multiple 1 to 2 mm smooth red papules  Objective  Left Forearm - Anterior, Mid Back, Right Forearm - Anterior: Multiple 2 to 10 mm brown textured flattopped papules  Objective  Right Lower Back: Tri-chromic 1.8 cm crusty papule, favor inflamed keratosis over pigmented carcinoma.       A full examination was performed including scalp, head, eyes, ears, nose, lips, neck, chest, axillae, abdomen, back, buttocks, bilateral upper extremities, bilateral lower extremities, hands, feet, fingers, toes, fingernails, and toenails. All findings within normal limits unless otherwise noted below.  Areas beneath undergarments not fully examined.   Assessment & Plan  Screening exam for skin cancer Chest - Medial (Center)  Yearly skin check   Cherry angioma Right Abdomen (side) - Upper  Ok to leave if stable  Seborrheic keratosis (3) Left Forearm - Anterior; Right Forearm - Anterior; Mid Back  Intervention not currently necessary  Neoplasm of uncertain behavior of skin Right Lower Back  Skin / nail biopsy Type of biopsy: tangential   Informed consent: discussed and consent  obtained   Timeout: patient name, date of birth, surgical site, and procedure verified   Procedure prep:  Patient was prepped and draped in usual sterile fashion (Non sterile) Prep type:  Chlorhexidine Anesthesia: the lesion was anesthetized in a standard fashion   Anesthetic:  1% lidocaine w/ epinephrine 1-100,000 local infiltration Instrument used: flexible razor blade   Outcome: patient tolerated procedure well   Post-procedure details: wound care instructions given    Specimen 1 - Surgical pathology Differential Diagnosis: R/O seb K  Check Margins: No

## 2021-06-20 DIAGNOSIS — R142 Eructation: Secondary | ICD-10-CM | POA: Diagnosis not present

## 2021-06-20 DIAGNOSIS — Z1211 Encounter for screening for malignant neoplasm of colon: Secondary | ICD-10-CM | POA: Diagnosis not present

## 2021-06-20 DIAGNOSIS — K5900 Constipation, unspecified: Secondary | ICD-10-CM | POA: Diagnosis not present

## 2021-07-06 ENCOUNTER — Encounter (HOSPITAL_COMMUNITY): Payer: Self-pay | Admitting: Emergency Medicine

## 2021-07-06 ENCOUNTER — Emergency Department (HOSPITAL_COMMUNITY)
Admission: EM | Admit: 2021-07-06 | Discharge: 2021-07-07 | Disposition: A | Discharge status: Home / Self Care | Payer: Medicare Other | Attending: Emergency Medicine | Admitting: Emergency Medicine

## 2021-07-06 ENCOUNTER — Emergency Department (HOSPITAL_COMMUNITY): Payer: Medicare Other

## 2021-07-06 ENCOUNTER — Other Ambulatory Visit: Payer: Self-pay

## 2021-07-06 DIAGNOSIS — R109 Unspecified abdominal pain: Secondary | ICD-10-CM | POA: Diagnosis not present

## 2021-07-06 DIAGNOSIS — Z7901 Long term (current) use of anticoagulants: Secondary | ICD-10-CM | POA: Diagnosis not present

## 2021-07-06 DIAGNOSIS — Z794 Long term (current) use of insulin: Secondary | ICD-10-CM | POA: Insufficient documentation

## 2021-07-06 DIAGNOSIS — K6289 Other specified diseases of anus and rectum: Secondary | ICD-10-CM

## 2021-07-06 DIAGNOSIS — E119 Type 2 diabetes mellitus without complications: Secondary | ICD-10-CM | POA: Diagnosis not present

## 2021-07-06 DIAGNOSIS — R1084 Generalized abdominal pain: Secondary | ICD-10-CM | POA: Diagnosis not present

## 2021-07-06 DIAGNOSIS — E039 Hypothyroidism, unspecified: Secondary | ICD-10-CM | POA: Diagnosis not present

## 2021-07-06 DIAGNOSIS — Z79899 Other long term (current) drug therapy: Secondary | ICD-10-CM | POA: Insufficient documentation

## 2021-07-06 LAB — COMPREHENSIVE METABOLIC PANEL
ALT: 14 U/L (ref 0–44)
AST: 16 U/L (ref 15–41)
Albumin: 4.3 g/dL (ref 3.5–5.0)
Alkaline Phosphatase: 58 U/L (ref 38–126)
Anion gap: 8 (ref 5–15)
BUN: 8 mg/dL (ref 8–23)
CO2: 25 mmol/L (ref 22–32)
Calcium: 9.6 mg/dL (ref 8.9–10.3)
Chloride: 105 mmol/L (ref 98–111)
Creatinine, Ser: 0.73 mg/dL (ref 0.44–1.00)
GFR, Estimated: >60 mL/min (ref >60)
Glucose, Bld: 134 mg/dL — ABNORMAL HIGH (ref 70–99)
Potassium: 4.4 mmol/L (ref 3.5–5.1)
Sodium: 138 mmol/L (ref 135–145)
Total Bilirubin: 0.5 mg/dL (ref 0.3–1.2)
Total Protein: 7.4 g/dL (ref 6.5–8.1)

## 2021-07-06 LAB — CBC
HCT: 43.3 % (ref 36.0–46.0)
Hemoglobin: 13.8 g/dL (ref 12.0–15.0)
MCH: 29.7 pg (ref 26.0–34.0)
MCHC: 31.9 g/dL (ref 30.0–36.0)
MCV: 93.3 fL (ref 80.0–100.0)
Platelets: 263 10*3/uL (ref 150–400)
RBC: 4.64 MIL/uL (ref 3.87–5.11)
RDW: 14.1 % (ref 11.5–15.5)
WBC: 7.3 10*3/uL (ref 4.0–10.5)
nRBC: 0 % (ref 0.0–0.2)

## 2021-07-06 LAB — LIPASE, BLOOD: Lipase: 25 U/L (ref 11–51)

## 2021-07-06 MED ORDER — IOHEXOL 300 MG/ML  SOLN
100.0000 mL | Freq: Once | INTRAMUSCULAR | Status: AC | PRN
  Administered 2021-07-07: 100 mL via INTRAVENOUS

## 2021-07-06 MED ORDER — SODIUM CHLORIDE 0.9 % IV BOLUS
500.0000 mL | Freq: Once | INTRAVENOUS | Status: AC
  Administered 2021-07-07: 500 mL via INTRAVENOUS

## 2021-07-06 NOTE — ED Provider Notes (Signed)
[facility] EMERGENCY DEPARTMENT Provider Note   CSN: [id number] Arrival date & time: 07/06/21  1830     History  Chief Complaint  Patient presents with   Abdominal Pain    Yolanda Franco is a 88 y.o. female.  Patient is an 88 year old female with past medical history of diabetes, hypothyroidism.  Patient presenting with complaints of rectal and lower abdominal pain.  She describes being constipated several weeks ago.  She was given fiber and laxatives, then had bowel movements.  Since then she has been having pain in her rectum and lower abdomen.  She describes being only able to pass small caliber stools.  She denies to me she is having any rectal bleeding, fevers, or vomiting.  The history is provided by the patient.  Abdominal Pain Pain location:  Generalized Pain quality: cramping   Pain radiates to:  Does not radiate Pain severity:  Moderate Onset quality:  Gradual Duration:  3 weeks Timing:  Constant Progression:  Worsening     Home Medications Prior to Admission medications   Medication Sig Start Date End Date Taking? Authorizing Provider  amitriptyline (ELAVIL) 25 MG tablet Take 25 mg by mouth at bedtime. Patient not taking: Reported on 08/29/2020    [provider]  amLODipine (NORVASC) 5 MG tablet Take 5 mg by mouth daily.    [provider]  apixaban (ELIQUIS) 2.5 MG TABS tablet Take by mouth 2 (two) times daily.    [provider]  aspirin-acetaminophen-caffeine (EXCEDRIN MIGRAINE) [phone] MG per tablet Take 1 tablet by mouth every 6 (six) hours as needed for pain. Patient not taking: Reported on 08/29/2020    [provider]  baclofen (LIORESAL) 10 MG tablet Take 10 mg by mouth daily as needed for muscle spasms.  Patient not taking: Reported on 08/29/2020 09/29/12   [provider]  fish oil-omega-3 fatty acids 1000 MG capsule Take 2 g by mouth daily. Patient not taking: Reported on 08/29/2020    [provider]  FLUoxetine (PROZAC) 40 MG capsule Take 40 mg by mouth daily.    [provider]  hydroxychloroquine (PLAQUENIL) 200 MG tablet Take 200 mg by mouth 2 (two) times daily.  Patient not taking: Reported on 08/29/2020 09/03/12   [provider]  insulin detemir (LEVEMIR) 100 UNIT/ML injection Inject 100 Units into the skin daily.    [provider]  levothyroxine (SYNTHROID, LEVOTHROID) 25 MCG tablet Take 12 mcg by mouth daily before breakfast. 1/2 tab qd 08/23/12   [provider]  mirtazapine (REMERON) 15 MG tablet Take 15 mg by mouth at bedtime.    [provider]  Multiple Vitamins-Minerals (ICAPS) CAPS Take 1 capsule by mouth daily. Patient not taking: Reported on 08/29/2020    [provider]  nitrofurantoin (MACRODANTIN) 50 MG capsule Take 50 mg by mouth at bedtime.  Patient not taking: Reported on 08/29/2020 08/16/12   [provider]  pentosan polysulfate (ELMIRON) 100 MG capsule Take 200 mg by mouth 2 (two) times daily. Patient not taking: Reported on 08/29/2020    [provider]  traZODone (DESYREL) 150 MG tablet Take 150 mg by mouth at bedtime.    [provider]  Vitamin D, Ergocalciferol, (DRISDOL) 50000 UNITS CAPS capsule Take 50,000 Units by mouth every 7 (seven) days. Patient takes on Wednesday Patient not taking: Reported on 08/29/2020    [provider]  zolmitriptan (ZOMIG) 5 MG tablet Take 5 mg by mouth as needed.  Patient not  taking: Reported on 08/29/2020 10/20/12   [provider]      Allergies    Aspirin, Codeine, and Eggs or egg-derived products    Review of Systems   Review of Systems  Gastrointestinal:  Positive for abdominal pain.  All other systems reviewed and are negative.  Physical Exam Updated Vital Signs BP (!) 168/78 (BP Location: Left Arm)    Pulse 67    Temp 98 F (36.7 C) (Oral)    Resp (!) 24    Ht 5' 5.5" (1.664 m)    Wt 61.2 kg    SpO2 100%     BMI 22.11 kg/m  Physical Exam Vitals and nursing note reviewed.  Constitutional:      General: She is not in acute distress.    Appearance: She is well-developed. She is not diaphoretic.  HENT:     Head: Normocephalic and atraumatic.  Cardiovascular:     Rate and Rhythm: Normal rate and regular rhythm.     Heart sounds: No murmur heard.   No friction rub. No gallop.  Pulmonary:     Effort: Pulmonary effort is normal. No respiratory distress.     Breath sounds: Normal breath sounds. No wheezing.  Abdominal:     General: Bowel sounds are normal. There is no distension.     Palpations: Abdomen is soft.     Tenderness: There is abdominal tenderness in the suprapubic area. There is no right CVA tenderness, left CVA tenderness, guarding or rebound.  Musculoskeletal:        General: Normal range of motion.     Cervical back: Normal range of motion and neck supple.  Skin:    General: Skin is warm and dry.  Neurological:     General: No focal deficit present.     Mental Status: She is alert and oriented to person, place, and time.    ED Results / Procedures / Treatments   Labs (all labs ordered are listed, but only abnormal results are displayed) Labs Reviewed  COMPREHENSIVE METABOLIC PANEL - Abnormal; Notable for the following components:      Result Value   Glucose, Bld 134 (*)    All other components within normal limits  LIPASE, BLOOD  CBC  URINALYSIS, ROUTINE W REFLEX MICROSCOPIC    EKG None  Radiology No results found.  Procedures Procedures    Medications Ordered in ED Medications  sodium chloride 0.9 % bolus 500 mL (has no administration in time range)    ED Course/ Medical Decision Making/ A&P  This patient presents to the ED for concern of rectal and lower abdominal pain, this involves an extensive number of treatment options, and is a complaint that carries with it a high risk of complications and morbidity.  The differential diagnosis includes  diverticulitis, proctitis, bowel obstruction, neoplasm   Co morbidities that complicate the patient evaluation  None   Additional history obtained:  No additional history obtained or outside records necessary for review   Lab Tests:  I Ordered, and personally interpreted labs.  The pertinent results include: Basically unremarkable CBC, basic metabolic panel   Imaging Studies ordered:  I ordered imaging studies including CT scan of the abdomen and pelvis I independently visualized and interpreted imaging which showed proctitis I agree with the radiologist interpretation   Cardiac Monitoring:  No cardiac monitoring performed   Medicines ordered and prescription drug management:  No medications given in the ED I have reviewed the patients home medicines and have made  adjustments as needed   Test Considered:  No other test considered or ordered   Critical Interventions:  None   Consultations Obtained:  No emergent consultations required   Problem List / ED Course:  Patient presenting with complaints of rectal and lower abdominal pain that appears related to proctitis visible on CT scan.  This will be treated with Proctofoam and Flagyl.  She is to return as needed if symptoms worsen or change.  She does have an appointment for a colonoscopy with a gastroenterologist in [place] upcoming and I have advised her to keep this appointment.   Reevaluation:  After the interventions noted above, I reevaluated the patient and found that they have :stayed the same   Social Determinants of Health:  None   Dispostion:  After consideration of the diagnostic results and the patients response to treatment, I feel that the patent would benefit from discharge to home with Flagyl and Proctofoam and follow-up with her GI doctor..    Final Clinical Impression(s) / ED Diagnoses Final diagnoses:  None    Rx / DC Orders ED Discharge Orders     None          Veryl Speak, MD 07/07/21 [id number]

## 2021-07-06 NOTE — ED Triage Notes (Signed)
Pt c/o severe "colon pain" x several weeks. Pt has colonoscopy scheduled but says she cant wait. Pt reports taking metamucil and prunes with minimal results.

## 2021-07-07 DIAGNOSIS — R109 Unspecified abdominal pain: Secondary | ICD-10-CM | POA: Diagnosis not present

## 2021-07-07 LAB — URINALYSIS, ROUTINE W REFLEX MICROSCOPIC
Bilirubin Urine: NEGATIVE
Glucose, UA: NEGATIVE mg/dL
Hgb urine dipstick: NEGATIVE
Ketones, ur: NEGATIVE mg/dL
Nitrite: NEGATIVE
Protein, ur: NEGATIVE mg/dL
Specific Gravity, Urine: 1.01 (ref 1.005–1.030)
pH: 7 (ref 5.0–8.0)

## 2021-07-07 LAB — URINALYSIS, MICROSCOPIC (REFLEX)

## 2021-07-07 MED ORDER — METRONIDAZOLE 500 MG PO TABS: 500.0000 mg | ORAL_TABLET | Freq: Two times a day (BID) | ORAL | 0 refills | Status: DC

## 2021-07-07 MED ORDER — HYDROCORT-PRAMOXINE (PERIANAL) 1-1 % EX FOAM: 1.0000 | Freq: Two times a day (BID) | CUTANEOUS | 1 refills | Status: DC

## 2021-07-07 NOTE — Discharge Instructions (Signed)
Begin using Proctofoam as prescribed.  Begin taking Flagyl as prescribed.  Follow-up with your gastroenterologist as previously scheduled, and return to the ER if symptoms significantly worsen or change.

## 2021-07-18 DIAGNOSIS — Z800 Family history of malignant neoplasm of digestive organs: Secondary | ICD-10-CM | POA: Diagnosis not present

## 2021-07-18 DIAGNOSIS — K5900 Constipation, unspecified: Secondary | ICD-10-CM | POA: Diagnosis not present

## 2021-07-18 DIAGNOSIS — Z1211 Encounter for screening for malignant neoplasm of colon: Secondary | ICD-10-CM | POA: Diagnosis not present

## 2021-07-18 DIAGNOSIS — R109 Unspecified abdominal pain: Secondary | ICD-10-CM | POA: Diagnosis not present

## 2021-07-30 DIAGNOSIS — K5730 Diverticulosis of large intestine without perforation or abscess without bleeding: Secondary | ICD-10-CM | POA: Diagnosis not present

## 2021-07-30 DIAGNOSIS — Z1211 Encounter for screening for malignant neoplasm of colon: Secondary | ICD-10-CM | POA: Diagnosis not present

## 2021-07-30 DIAGNOSIS — K635 Polyp of colon: Secondary | ICD-10-CM | POA: Diagnosis not present

## 2021-07-30 DIAGNOSIS — R935 Abnormal findings on diagnostic imaging of other abdominal regions, including retroperitoneum: Secondary | ICD-10-CM | POA: Diagnosis not present

## 2021-07-30 DIAGNOSIS — R109 Unspecified abdominal pain: Secondary | ICD-10-CM | POA: Diagnosis not present

## 2021-08-03 ENCOUNTER — Other Ambulatory Visit: Payer: Self-pay

## 2021-08-03 ENCOUNTER — Emergency Department (HOSPITAL_COMMUNITY): Payer: Medicare Other

## 2021-08-03 ENCOUNTER — Encounter (HOSPITAL_COMMUNITY): Payer: Self-pay

## 2021-08-03 ENCOUNTER — Inpatient Hospital Stay (HOSPITAL_COMMUNITY)
Admission: EM | Admit: 2021-08-03 | Discharge: 2021-08-07 | DRG: 522 | Disposition: A | Discharge status: Skilled Nursing Facility | Payer: Medicare Other | Attending: Family Medicine | Admitting: Family Medicine

## 2021-08-03 ENCOUNTER — Inpatient Hospital Stay (HOSPITAL_COMMUNITY): Payer: Medicare Other

## 2021-08-03 DIAGNOSIS — M25551 Pain in right hip: Secondary | ICD-10-CM | POA: Diagnosis not present

## 2021-08-03 DIAGNOSIS — W19XXXA Unspecified fall, initial encounter: Secondary | ICD-10-CM | POA: Diagnosis not present

## 2021-08-03 DIAGNOSIS — Z885 Allergy status to narcotic agent status: Secondary | ICD-10-CM

## 2021-08-03 DIAGNOSIS — Z471 Aftercare following joint replacement surgery: Secondary | ICD-10-CM | POA: Diagnosis not present

## 2021-08-03 DIAGNOSIS — R0689 Other abnormalities of breathing: Secondary | ICD-10-CM | POA: Diagnosis not present

## 2021-08-03 DIAGNOSIS — Z91012 Allergy to eggs: Secondary | ICD-10-CM

## 2021-08-03 DIAGNOSIS — Z743 Need for continuous supervision: Secondary | ICD-10-CM | POA: Diagnosis not present

## 2021-08-03 DIAGNOSIS — R1319 Other dysphagia: Secondary | ICD-10-CM | POA: Diagnosis not present

## 2021-08-03 DIAGNOSIS — Z886 Allergy status to analgesic agent status: Secondary | ICD-10-CM | POA: Diagnosis not present

## 2021-08-03 DIAGNOSIS — I10 Essential (primary) hypertension: Secondary | ICD-10-CM

## 2021-08-03 DIAGNOSIS — Z20822 Contact with and (suspected) exposure to covid-19: Secondary | ICD-10-CM | POA: Diagnosis not present

## 2021-08-03 DIAGNOSIS — S72001A Fracture of unspecified part of neck of right femur, initial encounter for closed fracture: Secondary | ICD-10-CM | POA: Diagnosis not present

## 2021-08-03 DIAGNOSIS — I480 Paroxysmal atrial fibrillation: Secondary | ICD-10-CM | POA: Diagnosis not present

## 2021-08-03 DIAGNOSIS — Z79890 Hormone replacement therapy (postmenopausal): Secondary | ICD-10-CM | POA: Diagnosis not present

## 2021-08-03 DIAGNOSIS — R6889 Other general symptoms and signs: Secondary | ICD-10-CM | POA: Diagnosis not present

## 2021-08-03 DIAGNOSIS — G43909 Migraine, unspecified, not intractable, without status migrainosus: Secondary | ICD-10-CM | POA: Diagnosis not present

## 2021-08-03 DIAGNOSIS — Z79899 Other long term (current) drug therapy: Secondary | ICD-10-CM

## 2021-08-03 DIAGNOSIS — Z7901 Long term (current) use of anticoagulants: Secondary | ICD-10-CM

## 2021-08-03 DIAGNOSIS — E039 Hypothyroidism, unspecified: Secondary | ICD-10-CM

## 2021-08-03 DIAGNOSIS — Z9181 History of falling: Secondary | ICD-10-CM | POA: Diagnosis not present

## 2021-08-03 DIAGNOSIS — W010XXA Fall on same level from slipping, tripping and stumbling without subsequent striking against object, initial encounter: Secondary | ICD-10-CM | POA: Diagnosis present

## 2021-08-03 DIAGNOSIS — E119 Type 2 diabetes mellitus without complications: Secondary | ICD-10-CM | POA: Diagnosis present

## 2021-08-03 DIAGNOSIS — I499 Cardiac arrhythmia, unspecified: Secondary | ICD-10-CM | POA: Diagnosis not present

## 2021-08-03 DIAGNOSIS — Z01810 Encounter for preprocedural cardiovascular examination: Secondary | ICD-10-CM

## 2021-08-03 DIAGNOSIS — Z794 Long term (current) use of insulin: Secondary | ICD-10-CM

## 2021-08-03 DIAGNOSIS — R262 Difficulty in walking, not elsewhere classified: Secondary | ICD-10-CM | POA: Diagnosis not present

## 2021-08-03 DIAGNOSIS — Z01818 Encounter for other preprocedural examination: Secondary | ICD-10-CM | POA: Diagnosis not present

## 2021-08-03 DIAGNOSIS — S72001D Fracture of unspecified part of neck of right femur, subsequent encounter for closed fracture with routine healing: Secondary | ICD-10-CM | POA: Diagnosis not present

## 2021-08-03 DIAGNOSIS — N3010 Interstitial cystitis (chronic) without hematuria: Secondary | ICD-10-CM | POA: Diagnosis present

## 2021-08-03 DIAGNOSIS — I4891 Unspecified atrial fibrillation: Secondary | ICD-10-CM | POA: Diagnosis not present

## 2021-08-03 DIAGNOSIS — Z823 Family history of stroke: Secondary | ICD-10-CM

## 2021-08-03 DIAGNOSIS — R498 Other voice and resonance disorders: Secondary | ICD-10-CM | POA: Diagnosis not present

## 2021-08-03 DIAGNOSIS — Y92013 Bedroom of single-family (private) house as the place of occurrence of the external cause: Secondary | ICD-10-CM

## 2021-08-03 DIAGNOSIS — F32A Depression, unspecified: Secondary | ICD-10-CM | POA: Diagnosis not present

## 2021-08-03 DIAGNOSIS — M6281 Muscle weakness (generalized): Secondary | ICD-10-CM | POA: Diagnosis not present

## 2021-08-03 DIAGNOSIS — Z96641 Presence of right artificial hip joint: Secondary | ICD-10-CM | POA: Diagnosis not present

## 2021-08-03 DIAGNOSIS — R2689 Other abnormalities of gait and mobility: Secondary | ICD-10-CM | POA: Diagnosis not present

## 2021-08-03 DIAGNOSIS — F419 Anxiety disorder, unspecified: Secondary | ICD-10-CM | POA: Diagnosis present

## 2021-08-03 DIAGNOSIS — Z8781 Personal history of (healed) traumatic fracture: Secondary | ICD-10-CM

## 2021-08-03 HISTORY — DX: Essential (primary) hypertension: I10

## 2021-08-03 HISTORY — DX: Hypothyroidism, unspecified: E03.9

## 2021-08-03 LAB — CBC WITH DIFFERENTIAL/PLATELET
Abs Immature Granulocytes: 0.04 10*3/uL (ref 0.00–0.07)
Basophils Absolute: 0 10*3/uL (ref 0.0–0.1)
Basophils Relative: 0 %
Eosinophils Absolute: 0.1 10*3/uL (ref 0.0–0.5)
Eosinophils Relative: 1 %
HCT: 38.4 % (ref 36.0–46.0)
Hemoglobin: 12.3 g/dL (ref 12.0–15.0)
Immature Granulocytes: 0 %
Lymphocytes Relative: 7 %
Lymphs Abs: 0.7 10*3/uL (ref 0.7–4.0)
MCH: 29.8 pg (ref 26.0–34.0)
MCHC: 32 g/dL (ref 30.0–36.0)
MCV: 93 fL (ref 80.0–100.0)
Monocytes Absolute: 0.5 10*3/uL (ref 0.1–1.0)
Monocytes Relative: 5 %
Neutro Abs: 8.2 10*3/uL — ABNORMAL HIGH (ref 1.7–7.7)
Neutrophils Relative %: 87 %
Platelets: 211 10*3/uL (ref 150–400)
RBC: 4.13 MIL/uL (ref 3.87–5.11)
RDW: 14.2 % (ref 11.5–15.5)
WBC: 9.5 10*3/uL (ref 4.0–10.5)
nRBC: 0 % (ref 0.0–0.2)

## 2021-08-03 LAB — COMPREHENSIVE METABOLIC PANEL
ALT: 18 U/L (ref 0–44)
AST: 19 U/L (ref 15–41)
Albumin: 3.4 g/dL — ABNORMAL LOW (ref 3.5–5.0)
Alkaline Phosphatase: 49 U/L (ref 38–126)
Anion gap: 6 (ref 5–15)
BUN: 9 mg/dL (ref 8–23)
CO2: 27 mmol/L (ref 22–32)
Calcium: 8.6 mg/dL — ABNORMAL LOW (ref 8.9–10.3)
Chloride: 109 mmol/L (ref 98–111)
Creatinine, Ser: 0.46 mg/dL (ref 0.44–1.00)
GFR, Estimated: >60 mL/min (ref >60)
Glucose, Bld: 95 mg/dL (ref 70–99)
Potassium: 3.6 mmol/L (ref 3.5–5.1)
Sodium: 142 mmol/L (ref 135–145)
Total Bilirubin: 0.3 mg/dL (ref 0.3–1.2)
Total Protein: 6.3 g/dL — ABNORMAL LOW (ref 6.5–8.1)

## 2021-08-03 LAB — RESP PANEL BY RT-PCR (FLU A&B, COVID) ARPGX2
Influenza A by PCR: NEGATIVE
Influenza B by PCR: NEGATIVE
SARS Coronavirus 2 by RT PCR: NEGATIVE

## 2021-08-03 LAB — MAGNESIUM: Magnesium: 2 mg/dL (ref 1.7–2.4)

## 2021-08-03 MED ORDER — ADULT MULTIVITAMIN W/MINERALS CH
1.0000 | ORAL_TABLET | Freq: Every day | ORAL | Status: DC
Start: 2021-08-03 — End: 2021-08-07
  Administered 2021-08-03 – 2021-08-07 (×4): 1 via ORAL
  Filled 2021-08-03 (×4): qty 1

## 2021-08-03 MED ORDER — LEVOTHYROXINE SODIUM 25 MCG PO TABS
25.0000 ug | ORAL_TABLET | Freq: Every day | ORAL | Status: DC
  Administered 2021-08-04 – 2021-08-07 (×3): 25 ug via ORAL
  Filled 2021-08-03 (×3): qty 1

## 2021-08-03 MED ORDER — POLYETHYLENE GLYCOL 3350 17 G PO PACK
17.0000 g | PACK | Freq: Every day | ORAL | Status: DC
  Administered 2021-08-03 – 2021-08-07 (×4): 17 g via ORAL
  Filled 2021-08-03 (×4): qty 1

## 2021-08-03 MED ORDER — HYDROMORPHONE HCL 1 MG/ML IJ SOLN
0.5000 mg | INTRAMUSCULAR | Status: DC | PRN
  Administered 2021-08-03 – 2021-08-06 (×14): 0.5 mg via INTRAVENOUS
  Filled 2021-08-03 (×17): qty 0.5

## 2021-08-03 MED ORDER — AMLODIPINE BESYLATE 5 MG PO TABS
5.0000 mg | ORAL_TABLET | Freq: Every day | ORAL | Status: DC
  Administered 2021-08-03 – 2021-08-07 (×4): 5 mg via ORAL
  Filled 2021-08-03 (×4): qty 1

## 2021-08-03 MED ORDER — HYDROMORPHONE HCL 1 MG/ML IJ SOLN
0.5000 mg | Freq: Once | INTRAMUSCULAR | Status: DC
Start: 2021-08-03 — End: 2021-08-07
  Filled 2021-08-03: qty 1

## 2021-08-03 MED ORDER — ACETAMINOPHEN 325 MG PO TABS
650.0000 mg | ORAL_TABLET | Freq: Four times a day (QID) | ORAL | Status: DC | PRN
  Administered 2021-08-03: 650 mg via ORAL
  Filled 2021-08-03: qty 2

## 2021-08-03 MED ORDER — ENSURE ENLIVE PO LIQD
237.0000 mL | Freq: Two times a day (BID) | ORAL | Status: DC
  Administered 2021-08-04 – 2021-08-06 (×4): 237 mL via ORAL

## 2021-08-03 MED ORDER — FLUOXETINE HCL 20 MG PO CAPS
40.0000 mg | ORAL_CAPSULE | Freq: Every day | ORAL | Status: DC
  Administered 2021-08-03 – 2021-08-07 (×4): 40 mg via ORAL
  Filled 2021-08-03 (×4): qty 2

## 2021-08-03 MED ORDER — ONDANSETRON HCL 4 MG/2ML IJ SOLN
4.0000 mg | Freq: Four times a day (QID) | INTRAMUSCULAR | Status: DC | PRN
  Administered 2021-08-04 – 2021-08-05 (×2): 4 mg via INTRAVENOUS
  Filled 2021-08-03: qty 2

## 2021-08-03 MED ORDER — HYDROMORPHONE HCL 1 MG/ML IJ SOLN
0.5000 mg | Freq: Once | INTRAMUSCULAR | Status: AC
  Administered 2021-08-03: 0.5 mg via INTRAVENOUS

## 2021-08-03 MED ORDER — ENOXAPARIN SODIUM 40 MG/0.4ML IJ SOSY
40.0000 mg | PREFILLED_SYRINGE | Freq: Every day | INTRAMUSCULAR | Status: DC
  Administered 2021-08-03 – 2021-08-07 (×4): 40 mg via SUBCUTANEOUS
  Filled 2021-08-03 (×4): qty 0.4

## 2021-08-03 MED ORDER — TRAZODONE HCL 50 MG PO TABS
150.0000 mg | ORAL_TABLET | Freq: Every day | ORAL | Status: DC
  Administered 2021-08-03 – 2021-08-06 (×4): 150 mg via ORAL
  Filled 2021-08-03 (×4): qty 3

## 2021-08-03 MED ORDER — HYDROCODONE-ACETAMINOPHEN 5-325 MG PO TABS
1.0000 | ORAL_TABLET | Freq: Four times a day (QID) | ORAL | Status: DC | PRN
  Administered 2021-08-03: 1 via ORAL
  Administered 2021-08-04 – 2021-08-06 (×7): 2 via ORAL
  Administered 2021-08-07: 1 via ORAL
  Filled 2021-08-03 (×4): qty 2
  Filled 2021-08-03: qty 1
  Filled 2021-08-03 (×5): qty 2

## 2021-08-03 MED ORDER — MIRTAZAPINE 15 MG PO TABS
15.0000 mg | ORAL_TABLET | Freq: Every day | ORAL | Status: DC
Start: 2021-08-03 — End: 2021-08-07
  Administered 2021-08-03 – 2021-08-06 (×4): 15 mg via ORAL
  Filled 2021-08-03 (×4): qty 1

## 2021-08-03 NOTE — ED Triage Notes (Addendum)
Pt fell this morning transferring from bedside commode to bed. Pt landed on bottom and right hip. REMS gave 4 mg of Morphine at 0705 hrs.Pedal pulse noted to right foot.

## 2021-08-03 NOTE — Assessment & Plan Note (Addendum)
Ortho consulted--Dr. Amedeo Kinsman Operative repair planned on 08/05/21 Judicious opioids 2/27--Right hemiarthroplasty PT>>SNF

## 2021-08-03 NOTE — Assessment & Plan Note (Addendum)
Currently in sinus Personally reviewed EKG--sinus, nonspecific [facility] Holding apixaban for surgery Resume apixaban post-op on 08/07/21

## 2021-08-03 NOTE — Consult Note (Signed)
ORTHOPAEDIC CONSULTATION  REQUESTING PHYSICIAN: Tat, Shanon Brow, MD  ASSESSMENT AND PLAN: 88 y.o. female with the following: Right Hip Displaced femoral neck fracture  This patient requires inpatient admission to the hospitalist, to include preoperative clearance and perioperative medical management  - Weight Bearing Status/Activity: NWB Right lower extremity  - Additional recommended labs/tests: Preop Labs: CBC, BMP, PT/INR, Chest XR, and EKG  -VTE Prophylaxis: Please hold prior to OR; to resume POD#1 at the discretion of the primary team  - Pain control: Recommend PO pain medications PRN; judicious use of narcotics  - Follow-up plan: F/u 10-14 days postop  -Procedures: Plan for OR once patient has been medically optimized  Plan for Right Hip hemiarthroplasty   Patient takes Eliquis, last dose Friday pm.  Will have to wait for 48 hours before we can proceed with surgery.  Plan for OR 09/02/21.  Please make sure patient is NPO at midnight.  Chief Complaint: Right hip pain  HPI: Yolanda Franco is a 88 y.o. female who presented to the ED for evaluation after sustaining a mechanical fall.  She fell early this morning after using the bedside commode.  She typically uses a walker or a cane but was not using it when she got up to use the commode.  She tripped and fell.  She landed on her hip.  She did not hit her head.  She is not complaining of pain elsewhere.  She lives with her niece.  She has pain in her right groin.  It worsens with movement.  She is comfortable if her leg is not moving.    Past Medical History:  Diagnosis Date   Anxiety    Depression    Dizzy spells 10/27/2012   Heart disease    leaking valve   Migraine    Shaking spells 10/27/2012   Weakness 10/27/2012   Past Surgical History:  Procedure Laterality Date   BACK SURGERY  1981, 2002   BUNIONECTOMY Bilateral 1986   KNEE SURGERY Right 1991   Cartilage   NOSE SURGERY  mid 72's   VAGINAL HYSTERECTOMY  1980    Social History   Socioeconomic History   Marital status: Married    Spouse name: Jeneen Rinks   Number of children: 3   Years of education: 11th   Highest education level: Not on file  Occupational History   Occupation: Retired  Tobacco Use   Smoking status: Never   Smokeless tobacco: Never  Substance and Sexual Activity   Alcohol use: No   Drug use: No   Sexual activity: Not on file  Other Topics Concern   Not on file  Social History Narrative   Pt lives at home with spouse.   Caffeine Use: Quit in 1994   Social Determinants of Health   Financial Resource Strain: Not on file  Food Insecurity: Not on file  Transportation Needs: Not on file  Physical Activity: Not on file  Stress: Not on file  Social Connections: Not on file   Family History  Problem Relation Age of Onset   Stroke Mother    Other Father        surgery complications   Migraines Other    Arthritis Other    Allergies  Allergen Reactions   Aspirin    Codeine     Causes Migraines   Eggs Or Egg-Derived Products     Causes Migraine   Prior to Admission medications   Medication Sig Start Date End Date Taking? Authorizing Provider  amLODipine (NORVASC) 5 MG tablet Take 5 mg by mouth daily.   Yes [provider]  apixaban (ELIQUIS) 2.5 MG TABS tablet Take by mouth 2 (two) times daily.   Yes [provider]  FLUoxetine (PROZAC) 40 MG capsule Take 40 mg by mouth daily.   Yes [provider]  Glucosamine 500 MG CAPS Take by mouth. 01/08/19  Yes [provider]  insulin detemir (LEVEMIR) 100 UNIT/ML injection Inject 10 Units into the skin daily.   Yes [provider]  levothyroxine (SYNTHROID, LEVOTHROID) 25 MCG tablet Take 12 mcg by mouth daily before breakfast. 1/2 tab qd 08/23/12  Yes [provider]  mirtazapine (REMERON) 15 MG tablet Take 15 mg by mouth at bedtime.   Yes [provider]  traZODone (DESYREL) 150 MG tablet Take 150 mg by mouth at  bedtime.   Yes [provider]  hydrocortisone-pramoxine (PROCTOFOAM-HC) rectal foam Place 1 applicator rectally 2 (two) times daily. Patient not taking: Reported on 08/03/2021 07/07/21   Veryl Speak, MD  metroNIDAZOLE (FLAGYL) 500 MG tablet Take 1 tablet (500 mg total) by mouth 2 (two) times daily. One po bid x 7 days Patient not taking: Reported on 08/03/2021 07/07/21   Veryl Speak, MD   Chest Portable 1 View  Result Date: 08/03/2021 CLINICAL DATA:  Preop evaluation for hip surgery EXAM: PORTABLE CHEST 1 VIEW COMPARISON:  CT done on 11/03/2013 FINDINGS: Cardiac size is within normal limits. Thoracic aorta is tortuous there are no signs of pulmonary edema or focal pulmonary consolidation. Left hemidiaphragm is elevated. There is no pleural effusion or pneumothorax. IMPRESSION: There are no signs of pulmonary edema or focal pulmonary consolidation. Electronically Signed   By: Elmer Picker M.D.   On: 08/03/2021 12:19   DG Hip Unilat  With Pelvis 2-3 Views Right  Result Date: 08/03/2021 CLINICAL DATA:  Fall with right hip pain EXAM: DG HIP (WITH OR WITHOUT PELVIS) 2-3V RIGHT COMPARISON:  07/07/2021 abdominal CT FINDINGS: Right femoral neck fracture with displacement and varus deformity. No evidence of pelvic ring fracture or diastasis. Pelvis is rotated to the right. Generalized osteopenia. IMPRESSION: Displaced right femoral neck fracture. Electronically Signed   By: Jorje Guild M.D.   On: 08/03/2021 08:49   Family History Reviewed and non-contributory, no pertinent history of problems with bleeding or anesthesia    Review of Systems No fevers or chills No numbness or tingling No chest pain No shortness of breath No bowel or bladder dysfunction No GI distress No headaches No hallucinations No excessive thirst No loss of consciousness    OBJECTIVE  Vitals:Patient Vitals for the past 8 hrs:  BP Temp Temp src Pulse Resp SpO2  08/03/21 2009 126/74 100.3 F (37.9 C)  Oral 65 20 95 %   General: Alert, no acute distress Cardiovascular: Extremities are warm Respiratory: No cyanosis, no use of accessory musculature Skin: No lesions in the area of chief complaint  Neurologic: Sensation intact distally  Psychiatric: Patient is competent for consent with normal mood and affect Lymphatic: No swelling obvious and reported other than the area involved in the exam below Extremities  RLE: Extremity held in a fixed position.  ROM deferred due to known fracture.  Sensation is intact distally in the sural, saphenous, DP, SP, and plantar nerve distribution. 2+ DP pulse.  Toes are WWP.  Active motion intact in the TA/EHL/GS. LLE: Sensation is intact distally in the sural, saphenous, DP, SP, and plantar nerve distribution. 2+ DP pulse.  Toes are  WWP.  Active motion intact in the TA/EHL/GS. Tolerates gentle ROM of the hip.  No pain with axial loading.     Test Results Imaging XR of the Right demonstrates a Displaced femoral neck fracture.  Labs cbc Recent Labs    08/03/21 0926  WBC 9.5  HGB 12.3  HCT 38.4  PLT 211    Labs inflam No results for input(s): CRP in the last 72 hours.  Invalid input(s): ESR  Labs coag No results for input(s): INR, PTT in the last 72 hours.  Invalid input(s): PT  Recent Labs    08/03/21 0926  NA 142  K 3.6  CL 109  CO2 27  GLUCOSE 95  BUN 9  CREATININE 0.46  CALCIUM 8.6*

## 2021-08-03 NOTE — Hospital Course (Addendum)
Female with a history of hypertension, atrial fibrillation on apixaban, interstitial cystitis, anxiety/depression, diabetes mellitus type 2, and hypothyroidism presenting with mechanical fall.  The patient was getting up from her commode transfer back to her bed when she slipped and fell onto her right side and buttocks in the early morning of 08/03/2021.  There was no syncope.  The patient has not had any frequent falls, but does use a walker to assist with ambulation.  She denies any recent fevers, chills, headache, neck pain, chest pain, shortness of breath, coughing, hemoptysis, nausea, vomiting, diarrhea, abdominal pain, dysuria, hematuria.  The patient did recently have a colonoscopy performed on 07/30/2021 which showed numerous polyps.  She has a follow-up appointment with her gastroenterologist on 08/06/2021.  She was not started on any new medications after her colonoscopy.  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  There is no hematochezia or melena.  She denies any previous history of stroke or coronary disease or MI. In the ED, the patient was afebrile hemodynamically stable with oxygen saturation 92% on room air. BMP showed sodium 142, potassium 3.6, serum creatinine 0.46.  LFTs were unremarkable.  WBC 9.5, hemoglobin 12.3, platelets 211,000.  EKG shows sinus rhythm and nonspecific T wave changes.  X-ray of the pelvis that showed a displaced right femoral neck fracture.  Orthopedics, Dr. Amedeo Kinsman was consulted to assist.  She underwent right hemiarthroplasty on 08/05/21.  PT recommended SNF with which patient agreed.

## 2021-08-03 NOTE — Progress Notes (Signed)
Initial Nutrition Assessment  DOCUMENTATION CODES:   Not applicable  INTERVENTION:   -Ensure Enlive po BID, each supplement provides 350 kcal and 20 grams of protein.  -MVI with minerals daily  NUTRITION DIAGNOSIS:   Increased nutrient needs related to post-op healing as evidenced by estimated needs.  GOAL:   Patient will meet greater than or equal to 90% of their needs  MONITOR:   PO intake, Supplement acceptance, Labs, Weight trends, Skin, I & O's  REASON FOR ASSESSMENT:   Consult Assessment of nutrition requirement/status, Hip fracture protocol  ASSESSMENT:   Yolanda Franco is a  Female with a history of hypertension, atrial fibrillation on apixaban, interstitial cystitis, anxiety/depression, diabetes mellitus type 2, and hypothyroidism presenting with mechanical fall.  The patient was getting up from her commode transfer back to her bed when she slipped and fell onto her right side and buttocks in the early morning of 08/03/2021.  There was no syncope.  The patient has not had any frequent falls, but does use a walker to assist with ambulation.  She denies any recent fevers, chills, headache, neck pain, chest pain, shortness of breath, coughing, hemoptysis, nausea, vomiting, diarrhea, abdominal pain, dysuria, hematuria.  The patient did recently have a colonoscopy performed on 07/30/2021 which showed numerous polyps.  She has a follow-up appointment with her gastroenterologist on 08/06/2021.  She was not started on any new medications after her colonoscopy.  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  There is no hematochezia or melena.  She denies any previous history of stroke or coronary disease or MI.  Pt admitted with rt femoral neck fracture.   Pt unavailable at time of visit. Attempted to speak with pt via call  to hospital room phone, however, unable to reach. RD unable to obtain further nutrition-related history or complete nutrition-focused physical exam at this time.    Per orthopedics, plan for surgery on 08/05/21.   Pt currently on a regular diet. No meal completion data available to assess at this time.   Reviewed wt hx; pt has experienced a 3.6% wt loss over the past month, which is not significant for time frame.   Pt with increased nutritional needs for post-operative healing and would benefit from addition of oral nutrition supplements.   Medications reviewed and include remeron and miralax.   Labs reviewed.   Diet Order:   Diet Order             Diet regular Room service appropriate? Yes; Fluid consistency: Thin  Diet effective now                   EDUCATION NEEDS:   No education needs have been identified at this time  Skin:  Skin Assessment: Reviewed RN Assessment  Last BM:  08/02/21  Height:   Ht Readings from Last 1 Encounters:  08/03/21 5\' 5"  (1.651 m)    Weight:   Wt Readings from Last 1 Encounters:  08/03/21 59 kg    Ideal Body Weight:  56.8 kg  BMI:  Body mass index is 21.63 kg/m.  Estimated Nutritional Needs:   Kcal:  1500-1700  Protein:  80-95 grams  Fluid:  > 1.5 L    Loistine Chance, RD, LDN, [place] Registered Dietitian II Certified Diabetes Care and Education Specialist Please refer to [facility]  for RD and/or RD on-call/weekend/after hours pager

## 2021-08-03 NOTE — Assessment & Plan Note (Signed)
Continue fluoxetine, trazodone and mirtazipine

## 2021-08-03 NOTE — Assessment & Plan Note (Signed)
Continue amlodpine

## 2021-08-03 NOTE — Assessment & Plan Note (Signed)
Continue levothyroxine 

## 2021-08-03 NOTE — H&P (Signed)
History and Physical    Patient: Yolanda Franco:[id number] DOB: 1932/11/30 DOA: 08/03/2021 DOS: the patient was seen and examined on 08/03/2021 PCP: [name], MD  Patient coming from: Home  Chief Complaint:  Chief Complaint  Patient presents with   Hip Injury    HPI: Yolanda Franco is a  Female with a history of hypertension, atrial fibrillation on apixaban, interstitial cystitis, anxiety/depression, diabetes mellitus type 2, and hypothyroidism presenting with mechanical fall.  The patient was getting up from her commode transfer back to her bed when she slipped and fell onto her right side and buttocks in the early morning of 08/03/2021.  There was no syncope.  The patient has not had any frequent falls, but does use a walker to assist with ambulation.  She denies any recent fevers, chills, headache, neck pain, chest pain, shortness of breath, coughing, hemoptysis, nausea, vomiting, diarrhea, abdominal pain, dysuria, hematuria.  The patient did recently have a colonoscopy performed on 07/30/2021 which showed numerous polyps.  She has a follow-up appointment with her gastroenterologist on 08/06/2021.  She was not started on any new medications after her colonoscopy.  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  There is no hematochezia or melena.  She denies any previous history of stroke or coronary disease or MI. In the ED, the patient was afebrile hemodynamically stable with oxygen saturation 92% on room air. BMP showed sodium 142, potassium 3.6, serum creatinine 0.46.  LFTs were unremarkable.  WBC 9.5, hemoglobin 12.3, platelets 211,000.  EKG shows sinus rhythm and nonspecific T wave changes.  X-ray of the pelvis that showed a displaced right femoral neck fracture.  Orthopedics, Dr. Amedeo Kinsman was consulted to assist.   Review  of Systems: As mentioned in the history of present illness. All other systems reviewed and are negative. Past Medical History:  Diagnosis Date   Anxiety    Depression    Dizzy spells 10/27/2012   Heart disease    leaking valve   Migraine    Shaking spells 10/27/2012   Weakness 10/27/2012   Past Surgical History:  Procedure Laterality Date   BACK SURGERY  1981, 2002   BUNIONECTOMY Bilateral 1986   KNEE SURGERY Right 1991   Cartilage   NOSE SURGERY  mid 16's   VAGINAL HYSTERECTOMY  1980   Social History:  reports that she has never smoked. She has never used smokeless tobacco. She reports that she does not drink alcohol and does not use drugs.  Allergies  Allergen Reactions   Aspirin    Codeine     Causes Migraines   Eggs Or Egg-Derived Products     Causes Migraine    Family History  Problem Relation Age of Onset   Stroke Mother    Other Father        surgery complications   Migraines Other    Arthritis Other     Prior to Admission medications   Medication Sig Start Date End Date Taking? Authorizing Provider  amitriptyline (ELAVIL) 25 MG tablet Take 25 mg by mouth at bedtime. Patient not taking: Reported on 08/29/2020    [provider]  amLODipine (NORVASC) 5 MG tablet Take 5 mg by mouth daily.    [provider]  apixaban (ELIQUIS) 2.5 MG TABS tablet Take by mouth 2 (two) times daily.  [provider]  aspirin-acetaminophen-caffeine (EXCEDRIN MIGRAINE) [phone] MG per tablet Take 1 tablet by mouth every 6 (six) hours as needed for pain. Patient not taking: Reported on 08/29/2020    [provider]  baclofen (LIORESAL) 10 MG tablet Take 10 mg by mouth daily as needed for muscle spasms.  Patient not taking: Reported on 08/29/2020 09/29/12   [provider]  fish oil-omega-3 fatty acids 1000 MG capsule Take 2 g by mouth daily. Patient not taking: Reported on 08/29/2020    [provider]  FLUoxetine (PROZAC) 40 MG  capsule Take 40 mg by mouth daily.    [provider]  hydrocortisone-pramoxine [facility]) rectal foam Place 1 applicator rectally 2 (two) times daily. 07/07/21   Veryl Speak, MD  hydroxychloroquine (PLAQUENIL) 200 MG tablet Take 200 mg by mouth 2 (two) times daily.  Patient not taking: Reported on 08/29/2020 09/03/12   [provider]  insulin detemir (LEVEMIR) 100 UNIT/ML injection Inject 100 Units into the skin daily.    [provider]  levothyroxine (SYNTHROID, LEVOTHROID) 25 MCG tablet Take 12 mcg by mouth daily before breakfast. 1/2 tab qd 08/23/12   [provider]  metroNIDAZOLE (FLAGYL) 500 MG tablet Take 1 tablet (500 mg total) by mouth 2 (two) times daily. One po bid x 7 days 07/07/21   Veryl Speak, MD  mirtazapine (REMERON) 15 MG tablet Take 15 mg by mouth at bedtime.    [provider]  Multiple Vitamins-Minerals (ICAPS) CAPS Take 1 capsule by mouth daily. Patient not taking: Reported on 08/29/2020    [provider]  nitrofurantoin (MACRODANTIN) 50 MG capsule Take 50 mg by mouth at bedtime.  Patient not taking: Reported on 08/29/2020 08/16/12   [provider]  pentosan polysulfate (ELMIRON) 100 MG capsule Take 200 mg by mouth 2 (two) times daily. Patient not taking: Reported on 08/29/2020    [provider]  traZODone (DESYREL) 150 MG tablet Take 150 mg by mouth at bedtime.    [provider]  Vitamin D, Ergocalciferol, (DRISDOL) 50000 UNITS CAPS capsule Take 50,000 Units by mouth every 7 (seven) days. Patient takes on Wednesday Patient not taking: Reported on 08/29/2020    [provider]  zolmitriptan (ZOMIG) 5 MG tablet Take 5 mg by mouth as needed.  Patient not taking: Reported on 08/29/2020 10/20/12   [provider]    Physical Exam: Vitals:   08/03/21 0900 08/03/21 0930 08/03/21 1000 08/03/21 1100  BP: (!) 181/74 (!) 170/74 (!) 175/55 (!) 163/94  Pulse: 69 75 77 78  Resp:  (!) 26 (!) 29 (!) 27 (!) 28  Temp:      TempSrc:      SpO2: (!) 86% 90% 92% 97%  Weight:      Height:       GENERAL:  A&O x 3, NAD, well developed, cooperative, follows commands HEENT: [place]/AT, No thrush, No icterus, No oral ulcers Neck:  No neck mass, No meningismus, soft, supple CV: RRR, no S3, no S4, no rub, no JVD Lungs:  bibasilar crackles, no wheeze, no rhonchi, good air movement Abd: soft/NT +BS, nondistended Ext: Nonpitting LE edema, no lymphangitis, no cyanosis, no rashes Neuro:  CN II-XII intact, strength 4/5 in RUE, RLE, strength 4/5 LUE, LLE; sensation intact bilateral; no dysmetria; babinski equivocal   Data Reviewed: Reviewed in history  Assessment and Plan: * Closed displaced fracture of right femoral neck ([place])- (present on admission) Ortho consulted--Dr. Amedeo Kinsman Operative repair planned on 08/05/21 Judicious  opioids Ok medically for surgery presently PT post op  Essential hypertension Continue amlodpine  Hypothyroidism Continue levothyroxine  Depression Continue fluoxetine, trazodone and mirtazipine  Paroxysmal atrial fibrillation (HCC) Currently in sinus Personally reviewed EKG--sinus, nonspecific [employer] apixaban for surgery       Advance Care Planning: FULL CODE  Consults: ortho  Family Communication: son updated 2/25  Severity of Illness: The appropriate patient status for this patient is INPATIENT. Inpatient status is judged to be reasonable and necessary in order to provide the required intensity of service to ensure the patient's safety. The patient's presenting symptoms, physical exam findings, and initial radiographic and laboratory data in the context of their chronic comorbidities is felt to place them at high risk for further clinical deterioration. Furthermore, it is not anticipated that the patient will be medically stable for discharge from the hospital within 2 midnights of admission.   * I certify that at the point of admission  it is my clinical judgment that the patient will require inpatient hospital care spanning beyond 2 midnights from the point of admission due to high intensity of service, high risk for further deterioration and high frequency of surveillance required.*  Author: Orson Eva, MD 08/03/2021 11:12 AM  For on call review [email or web address].

## 2021-08-03 NOTE — ED Provider Notes (Signed)
[facility] EMERGENCY DEPARTMENT Provider Note   CSN: [id number] Arrival date & time: 08/03/21  0744     History  Chief Complaint  Patient presents with   Hip Injury    Yolanda Franco is a 88 y.o. female.  Patient fell on her right hip today and has significant pain there.  She has a history of diabetes and hypertension and is on Eliquis for irregular heartbeat  The history is provided by the patient and medical records. No language interpreter was used.  Fall This is a new problem. The problem occurs constantly. The problem has not changed since onset.Pertinent negatives include no chest pain, no abdominal pain and no headaches. Nothing aggravates the symptoms. Nothing relieves the symptoms. She has tried nothing for the symptoms. The treatment provided no relief.      Home Medications Prior to Admission medications   Medication Sig Start Date End Date Taking? Authorizing Provider  amitriptyline (ELAVIL) 25 MG tablet Take 25 mg by mouth at bedtime. Patient not taking: Reported on 08/29/2020    [provider]  amLODipine (NORVASC) 5 MG tablet Take 5 mg by mouth daily.    [provider]  apixaban (ELIQUIS) 2.5 MG TABS tablet Take by mouth 2 (two) times daily.    [provider]  aspirin-acetaminophen-caffeine (EXCEDRIN MIGRAINE) [phone] MG per tablet Take 1 tablet by mouth every 6 (six) hours as needed for pain. Patient not taking: Reported on 08/29/2020    [provider]  baclofen (LIORESAL) 10 MG tablet Take 10 mg by mouth daily as needed for muscle spasms.  Patient not taking: Reported on 08/29/2020 09/29/12   [provider]  fish oil-omega-3 fatty acids 1000 MG capsule Take 2 g by mouth daily. Patient not taking: Reported on 08/29/2020    [provider]  FLUoxetine (PROZAC) 40 MG capsule Take 40 mg by mouth daily.    [provider]  hydrocortisone-pramoxine [facility]) rectal foam Place 1 applicator  rectally 2 (two) times daily. 07/07/21   Veryl Speak, MD  hydroxychloroquine (PLAQUENIL) 200 MG tablet Take 200 mg by mouth 2 (two) times daily.  Patient not taking: Reported on 08/29/2020 09/03/12   [provider]  insulin detemir (LEVEMIR) 100 UNIT/ML injection Inject 100 Units into the skin daily.    [provider]  levothyroxine (SYNTHROID, LEVOTHROID) 25 MCG tablet Take 12 mcg by mouth daily before breakfast. 1/2 tab qd 08/23/12   [provider]  metroNIDAZOLE (FLAGYL) 500 MG tablet Take 1 tablet (500 mg total) by mouth 2 (two) times daily. One po bid x 7 days 07/07/21   Veryl Speak, MD  mirtazapine (REMERON) 15 MG tablet Take 15 mg by mouth at bedtime.    [provider]  Multiple Vitamins-Minerals (ICAPS) CAPS Take 1 capsule by mouth daily. Patient not taking: Reported on 08/29/2020    [provider]  nitrofurantoin (MACRODANTIN) 50 MG capsule Take 50 mg by mouth at bedtime.  Patient not taking: Reported on 08/29/2020 08/16/12   [provider]  pentosan polysulfate (ELMIRON) 100 MG capsule Take 200 mg by mouth 2 (two) times daily. Patient not taking: Reported on 08/29/2020    [provider]  traZODone (DESYREL) 150 MG tablet Take 150 mg by mouth at bedtime.    [provider]  Vitamin D, Ergocalciferol, (DRISDOL) 50000 UNITS CAPS capsule Take 50,000 Units by mouth every 7 (seven) days. Patient takes on Wednesday Patient not taking: Reported on 08/29/2020    [provider]  zolmitriptan (ZOMIG) 5 MG tablet Take 5 mg by mouth as needed.  Patient not taking: Reported on 08/29/2020 10/20/12   [provider]      Allergies    Aspirin, Codeine, and Eggs or egg-derived products    Review of Systems   Review of Systems  Constitutional:  Negative for appetite change and fatigue.  HENT:  Negative for congestion, ear discharge and sinus pressure.   Eyes:  Negative for discharge.  Respiratory:  Negative  for cough.   Cardiovascular:  Negative for chest pain.  Gastrointestinal:  Negative for abdominal pain and diarrhea.  Genitourinary:  Negative for frequency and hematuria.  Musculoskeletal:  Negative for back pain.       Right hip pain  Skin:  Negative for rash.  Neurological:  Negative for seizures and headaches.  Psychiatric/Behavioral:  Negative for hallucinations.    Physical Exam Updated Vital Signs BP (!) 181/74    Pulse 69    Temp 98.4 F (36.9 C) (Oral)    Resp (!) 26    Ht 5\' 5"  (1.651 m)    Wt 59 kg    SpO2 (!) 86%    BMI 21.63 kg/m  Physical Exam Vitals and nursing note reviewed.  Constitutional:      Appearance: She is well-developed.  HENT:     Head: Normocephalic.     Nose: Nose normal.  Eyes:     General: No scleral icterus.    Conjunctiva/sclera: Conjunctivae normal.  Neck:     Thyroid: No thyromegaly.  Cardiovascular:     Rate and Rhythm: Normal rate and regular rhythm.     Heart sounds: No murmur heard.   No friction rub. No gallop.  Pulmonary:     Breath sounds: No stridor. No wheezing or rales.  Chest:     Chest wall: No tenderness.  Abdominal:     General: There is no distension.     Tenderness: There is no abdominal tenderness. There is no rebound.  Musculoskeletal:     Cervical back: Neck supple.     Comments: Tender right hip  Lymphadenopathy:     Cervical: No cervical adenopathy.  Skin:    Findings: No erythema or rash.  Neurological:     Mental Status: She is alert and oriented to person, place, and time.     Motor: No abnormal muscle tone.     Coordination: Coordination normal.  Psychiatric:        Behavior: Behavior normal.    ED Results / Procedures / Treatments   Labs (all labs ordered are listed, but only abnormal results are displayed) Labs Reviewed  CBC WITH DIFFERENTIAL/PLATELET - Abnormal; Notable for the following components:      Result Value   Neutro Abs 8.2 (*)    All other components within normal limits   COMPREHENSIVE METABOLIC PANEL - Abnormal; Notable for the following components:   Calcium 8.6 (*)    Total Protein 6.3 (*)    Albumin 3.4 (*)    All other components within normal limits  RESP PANEL BY RT-PCR (FLU A&B, COVID) [id number]    EKG None  Radiology DG Hip Unilat  With Pelvis 2-3 Views Right  Result Date: 08/03/2021 CLINICAL DATA:  Fall with right hip pain EXAM: DG HIP (WITH OR WITHOUT PELVIS) 2-3V RIGHT COMPARISON:  07/07/2021 abdominal CT FINDINGS: Right femoral neck fracture with displacement and varus deformity. No evidence of pelvic ring fracture or diastasis. Pelvis is rotated to the right.  Generalized osteopenia. IMPRESSION: Displaced right femoral neck fracture. Electronically Signed   By: Jorje Guild M.D.   On: 08/03/2021 08:49    Procedures Procedures    Medications Ordered in ED Medications  HYDROmorphone (DILAUDID) injection 0.5 mg (has no administration in time range)  HYDROmorphone (DILAUDID) injection 0.5 mg (has no administration in time range)    ED Course/ Medical Decision Making/ A&P                           Medical Decision Making Amount and/or Complexity of Data Reviewed Labs: ordered. Radiology: ordered. ECG/medicine tests: ordered.  Risk Prescription drug management. Decision regarding hospitalization.   Patient with a fractured hip.  She will be admitted to medicine with orthopedic consult.  The orthopedic surgeon states he will do the surgery on Monday and the Eliquis will be held   This patient presents to the ED for concern of fall, this involves an extensive number of treatment options, and is a complaint that carries with it a high risk of complications and morbidity.  The differential diagnosis includes hip dislocation, hip fracture,   Co morbidities that complicate the patient evaluation  Diabetes hypertension   Additional history obtained:  Additional history obtained from relative External records from outside source  obtained and reviewed including hospital records   Lab Tests:  I Ordered, and personally interpreted labs.  The pertinent results include: CBC and chemistries which were unremarkable   Imaging Studies ordered:  I ordered imaging studies including right hip film I independently visualized and interpreted imaging which showed displaced right femoral neck fracture I agree with the radiologist interpretation   Cardiac Monitoring:  The patient was maintained on a cardiac monitor.  I personally viewed and interpreted the cardiac monitored which showed an underlying rhythm of: Normal sinus rhythm   Medicines ordered and prescription drug management:  I ordered medication including Dilaudid for pain Reevaluation of the patient after these medicines showed that the patient improved I have reviewed the patients home medicines and have made adjustments as needed   Test Considered:  CT chest   Critical Interventions:  None   Consultations Obtained:  I requested consultation with the orthopedics and hospitalist,  and discussed lab and imaging findings as well as pertinent plan - they recommend: Admission to the hospital in the orthopedic 1 week.  The hip on Monday since the patient was on Eliquis   Problem List / ED Course:  Diabetes hypertension   Reevaluation:  After the interventions noted above, I reevaluated the patient and found that they have :stayed the same   Social Determinants of Health:  None   Dispostion:  After consideration of the diagnostic results and the patients response to treatment, I feel that the patent would benefit from admission to the hospital for repair of hip.         Final Clinical Impression(s) / ED Diagnoses Final diagnoses:  S/P right hip fracture    Rx / DC Orders ED Discharge Orders     None         Milton Ferguson, MD 08/05/21 [id number]

## 2021-08-03 NOTE — ED Notes (Signed)
2 lpm nasal cannula applied to keep oxygen above 90%.

## 2021-08-03 NOTE — Progress Notes (Signed)
This nurse noticed on telemetry that patients rhythm was vent bigeminy, rate was 70. MD made aware . No new orders at this time.

## 2021-08-04 LAB — BASIC METABOLIC PANEL
Anion gap: 7 (ref 5–15)
BUN: 17 mg/dL (ref 8–23)
CO2: 26 mmol/L (ref 22–32)
Calcium: 8.6 mg/dL — ABNORMAL LOW (ref 8.9–10.3)
Chloride: 103 mmol/L (ref 98–111)
Creatinine, Ser: 0.73 mg/dL (ref 0.44–1.00)
GFR, Estimated: >60 mL/min (ref >60)
Glucose, Bld: 148 mg/dL — ABNORMAL HIGH (ref 70–99)
Potassium: 4 mmol/L (ref 3.5–5.1)
Sodium: 136 mmol/L (ref 135–145)

## 2021-08-04 LAB — CBC
HCT: 38.1 % (ref 36.0–46.0)
Hemoglobin: 11.7 g/dL — ABNORMAL LOW (ref 12.0–15.0)
MCH: 28.7 pg (ref 26.0–34.0)
MCHC: 30.7 g/dL (ref 30.0–36.0)
MCV: 93.4 fL (ref 80.0–100.0)
Platelets: 199 10*3/uL (ref 150–400)
RBC: 4.08 MIL/uL (ref 3.87–5.11)
RDW: 14.3 % (ref 11.5–15.5)
WBC: 8.6 10*3/uL (ref 4.0–10.5)
nRBC: 0 % (ref 0.0–0.2)

## 2021-08-04 LAB — SURGICAL PCR SCREEN
MRSA, PCR: NEGATIVE
Staphylococcus aureus: NEGATIVE

## 2021-08-04 LAB — GLUCOSE, CAPILLARY
Glucose-Capillary: 118 mg/dL — ABNORMAL HIGH (ref 70–99)
Glucose-Capillary: 106 mg/dL — ABNORMAL HIGH (ref 70–99)
Glucose-Capillary: 119 mg/dL — ABNORMAL HIGH (ref 70–99)
Glucose-Capillary: 118 mg/dL — ABNORMAL HIGH (ref 70–99)

## 2021-08-04 LAB — MAGNESIUM: Magnesium: 2 mg/dL (ref 1.7–2.4)

## 2021-08-04 LAB — HEMOGLOBIN A1C
Hgb A1c MFr Bld: 5.2 % (ref 4.8–5.6)
Mean Plasma Glucose: 102.54 mg/dL

## 2021-08-04 MED ORDER — INSULIN ASPART 100 UNIT/ML IJ SOLN
0.0000 [IU] | Freq: Three times a day (TID) | INTRAMUSCULAR | Status: DC
  Administered 2021-08-05 – 2021-08-06 (×2): 1 [IU] via SUBCUTANEOUS
  Administered 2021-08-06 (×2): 2 [IU] via SUBCUTANEOUS
  Administered 2021-08-07: 1 [IU] via SUBCUTANEOUS

## 2021-08-04 MED ORDER — INSULIN ASPART 100 UNIT/ML IJ SOLN: 0.0000 [IU] | Freq: Every day | INTRAMUSCULAR | Status: DC

## 2021-08-04 NOTE — NC FL2 (Signed)
[place] LEVEL OF CARE SCREENING TOOL     IDENTIFICATION  Patient Name: Yolanda Franco Birthdate: 1932-07-26 Sex: female Admission Date (Current Location): 08/03/2021  [facility] [place] and [place] Number:  [employer] and Address:  [place] [address], [place]      Provider Number: [id number]  Attending Physician Name and Address:  Orson Eva, MD  Relative Name and Phone Number:       Current Level of Care: Hospital Recommended Level of Care: [place] Prior Approval Number:    Date Approved/Denied:   PASRR Number: pending  Discharge Plan: SNF    Current Diagnoses: Patient Active Problem List   Diagnosis Date Noted   Closed displaced fracture of right femoral neck ([place]) 08/03/2021   Paroxysmal atrial fibrillation ([place]) 08/03/2021   Depression 08/03/2021   Hypothyroidism 08/03/2021   Essential hypertension 08/03/2021   Dizzy spells 10/27/2012   Shaking spells 10/27/2012   Weakness 10/27/2012    Orientation RESPIRATION BLADDER Height & Weight     Self, Time, Situation, Place  O2 (2L) Continent Weight: 130 lb (59 kg) Height:  5\' 5"  (165.1 cm)  BEHAVIORAL SYMPTOMS/MOOD NEUROLOGICAL BOWEL NUTRITION STATUS      Continent Diet (NPO time specified. See d/c summary for udpates.)  AMBULATORY STATUS COMMUNICATION OF NEEDS Skin   Extensive Assist Verbally Surgical wounds                       Personal Care Assistance Level of Assistance  Bathing, Feeding, Dressing Bathing Assistance: Maximum assistance Feeding assistance: Limited assistance Dressing Assistance: Maximum assistance     Functional Limitations Info  Sight, Hearing, Speech Sight Info: Adequate Hearing Info: Adequate Speech Info: Adequate    SPECIAL CARE FACTORS FREQUENCY  PT (By licensed PT)     PT Frequency: 5x weekly              Contractures      Additional Factors Info  Code Status, Allergies, Psychotropic Code  Status Info: Full code Allergies Info: Aspirin, Codeine, Eggs or Egg-derived products Psychotropic Info: Remeron, Prozac, Elavil, Trazodone         Current Medications (08/04/2021):  This is the current hospital active medication list Current Facility-Administered Medications  Medication Dose Route Frequency Provider Last Rate Last Admin   acetaminophen (TYLENOL) tablet 650 mg  650 mg Oral Q6H PRN Tat, David, MD   650 mg at 08/03/21 2018   amLODipine (NORVASC) tablet 5 mg  5 mg Oral Daily Tat, David, MD   5 mg at 08/04/21 0945   enoxaparin (LOVENOX) injection 40 mg  40 mg Subcutaneous Daily Tat, David, MD   40 mg at 08/04/21 0946   feeding supplement (ENSURE ENLIVE / ENSURE PLUS) liquid 237 mL  237 mL Oral BID BM Tat, Shanon Brow, MD   237 mL at 08/04/21 0946   FLUoxetine (PROZAC) capsule 40 mg  40 mg Oral Daily Tat, David, MD   40 mg at 08/04/21 0945   HYDROcodone-acetaminophen (NORCO/VICODIN) 5-325 MG per tablet 1-2 tablet  1-2 tablet Oral Q6H PRN Tat, Shanon Brow, MD   2 tablet at 08/04/21 1214   HYDROmorphone (DILAUDID) injection 0.5 mg  0.5 mg Intravenous Once Milton Ferguson, MD       HYDROmorphone (DILAUDID) injection 0.5 mg  0.5 mg Intravenous Q2H PRN Tat, David, MD   0.5 mg at 08/04/21 1312   insulin aspart (novoLOG) injection 0-5 Units  0-5 Units Subcutaneous QHS  Orson Eva, MD       insulin aspart (novoLOG) injection 0-9 Units  0-9 Units Subcutaneous TID WC Tat, David, MD       levothyroxine (SYNTHROID) tablet 25 mcg  25 mcg Oral QAC breakfast Tat, Shanon Brow, MD   25 mcg at 08/04/21 0544   mirtazapine (REMERON) tablet 15 mg  15 mg Oral Benay Pike, MD   15 mg at 08/03/21 2243   multivitamin with minerals tablet 1 tablet  1 tablet Oral Daily Tat, David, MD   1 tablet at 08/04/21 0945   ondansetron (ZOFRAN) injection 4 mg  4 mg Intravenous Q6H PRN Tat, Shanon Brow, MD   4 mg at 08/04/21 1311   polyethylene glycol (MIRALAX / GLYCOLAX) packet 17 g  17 g Oral Daily Tat, David, MD   17 g at 08/04/21 0945    traZODone (DESYREL) tablet 150 mg  150 mg Oral Benay Pike, MD   150 mg at 08/03/21 2243     Discharge Medications: Please see discharge summary for a list of discharge medications.  Relevant Imaging Results:  Relevant Lab Results:   Additional Information SSN: [id number]. Pt reports she received COVID vaccines.  Salome Arnt, LCSW

## 2021-08-04 NOTE — Progress Notes (Signed)
PROGRESS NOTE  Yolanda Franco LHT:[id number] DOB: 17-Oct-1932 DOA: 08/03/2021 PCP: [name], MD  Brief History:  Female with a history of hypertension, atrial fibrillation on apixaban, interstitial cystitis, anxiety/depression, diabetes mellitus type 2, and hypothyroidism presenting with mechanical fall.  The patient was getting up from her commode transfer back to her bed when she slipped and fell onto her right side and buttocks in the early morning of 08/03/2021.  There was no syncope.  The patient has not had any frequent falls, but does use a walker to assist with ambulation.  She denies any recent fevers, chills, headache, neck pain, chest pain, shortness of breath, coughing, hemoptysis, nausea, vomiting, diarrhea, abdominal pain, dysuria, hematuria.  The patient did recently have a colonoscopy performed on 07/30/2021 which showed numerous polyps.  She has a follow-up appointment with her gastroenterologist on 08/06/2021.  She was not started on any new medications after her colonoscopy.  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  There is no hematochezia or melena.  She denies any previous history of stroke or coronary disease or MI. In the ED, the patient was afebrile hemodynamically stable with oxygen saturation 92% on room air. BMP showed sodium 142, potassium 3.6, serum creatinine 0.46.  LFTs were unremarkable.  WBC 9.5, hemoglobin 12.3, platelets 211,000.  EKG shows sinus rhythm and nonspecific T wave changes.  X-ray of the pelvis that showed a displaced right femoral neck fracture.  Orthopedics, Dr. Amedeo Kinsman was consulted to assist.      Assessment and Plan: * Closed displaced fracture of right femoral neck ([place])- (present on admission) Ortho consulted--Dr. Amedeo Kinsman Operative repair planned on 08/05/21 Judicious  opioids Ok medically for surgery presently PT post op  Paroxysmal atrial fibrillation (HCC) Currently in sinus Personally reviewed EKG--sinus, nonspecific [employer] apixaban for surgery Resume apixaban when ok with ortho  Essential hypertension Continue amlodpine  Hypothyroidism Continue levothyroxine  Depression Continue fluoxetine, trazodone and mirtazipine         Status is: Inpatient Remains inpatient appropriate because: severity of illness requiring operative intervention for femoral neck fracture            Family Communication:   son updated at bedside 2/26  Consultants:  ortho--[place]  Code Status:  FULL   DVT Prophylaxis:  apixaban on hold    Procedures: As Listed in Progress Note Above  Antibiotics: None       Subjective: Pain is controlled.  Patient denies fevers, chills, headache, chest pain, dyspnea, nausea, vomiting, diarrhea, abdominal pain, dysuria, hematuria, hematochezia, and melena.   Objective: Vitals:   08/03/21 2009 08/03/21 2359 08/04/21 0548 08/04/21 1423  BP: 126/74 (!) 116/56 (!) 114/59 (!) 115/55  Pulse: 65 62 82 75  Resp: 20 18 18 18   Temp: 100.3 F (37.9 C) 98.7 F (37.1 C) 99.1 F (37.3 C) 98.2 F (36.8 C)  TempSrc: Oral Oral Oral   SpO2: 95% 94% 95% 94%  Weight:      Height:        Intake/Output Summary (Last 24 hours) at 08/04/2021 1709 Last data filed at 08/04/2021 1300 Gross per 24 hour  Intake 660 ml  Output --  Net 660 ml   Weight change:  Exam:  General:  Pt is alert, follows commands appropriately, not in  acute distress HEENT: No icterus, No thrush, No neck mass, [place]/AT Cardiovascular: RRR, S1/S2, no rubs, no gallops Respiratory: bibasilar crackles. No wheeze Abdomen: Soft/+BS, non tender, non distended, no guarding Extremities: No edema, No lymphangitis, No petechiae, No rashes, no synovitis   Data Reviewed: I have personally reviewed following labs and imaging studies Basic  Metabolic Panel: Recent Labs  Lab 08/03/21 0926 08/03/21 0940 08/04/21 0536  NA 142  --  136  K 3.6  --  4.0  CL 109  --  103  CO2 27  --  26  GLUCOSE 95  --  148*  BUN 9  --  17  CREATININE 0.46  --  0.73  CALCIUM 8.6*  --  8.6*  MG  --  2.0 2.0   Liver Function Tests: Recent Labs  Lab 08/03/21 0926  AST 19  ALT 18  ALKPHOS 49  BILITOT 0.3  PROT 6.3*  ALBUMIN 3.4*   No results for input(s): LIPASE, AMYLASE in the last 168 hours. No results for input(s): AMMONIA in the last 168 hours. Coagulation Profile: No results for input(s): INR, PROTIME in the last 168 hours. CBC: Recent Labs  Lab 08/03/21 0926 08/04/21 0536  WBC 9.5 8.6  NEUTROABS 8.2*  --   HGB 12.3 11.7*  HCT 38.4 38.1  MCV 93.0 93.4  PLT 211 199   Cardiac Enzymes: No results for input(s): CKTOTAL, CKMB, CKMBINDEX, TROPONINI in the last 168 hours. BNP: Invalid input(s): POCBNP CBG: Recent Labs  Lab 08/04/21 0722 08/04/21 1138 08/04/21 1642  GLUCAP 118* 106* 119*   HbA1C: No results for input(s): HGBA1C in the last 72 hours. Urine analysis:    Component Value Date/Time   COLORURINE YELLOW 07/06/2021 0153   APPEARANCEUR CLEAR 07/06/2021 0153   LABSPEC 1.010 07/06/2021 0153   PHURINE 7.0 07/06/2021 0153   GLUCOSEU NEGATIVE 07/06/2021 0153   HGBUR NEGATIVE 07/06/2021 0153   BILIRUBINUR NEGATIVE 07/06/2021 0153   KETONESUR NEGATIVE 07/06/2021 0153   PROTEINUR NEGATIVE 07/06/2021 0153   NITRITE NEGATIVE 07/06/2021 0153   LEUKOCYTESUR TRACE (A) 07/06/2021 0153   Sepsis Labs: @LABRCNTIP (procalcitonin:4,lacticidven:4) ) Recent Results (from the past 240 hour(s))  Resp Panel by RT-PCR (Flu A&B, Covid) Nasopharyngeal Swab     Status: None   Collection Time: 08/03/21  8:23 AM   Specimen: Nasopharyngeal Swab; Nasopharyngeal(NP) swabs in vial transport medium  Result Value Ref Range Status   SARS Coronavirus 2 by RT PCR NEGATIVE NEGATIVE Final    Comment: (NOTE) SARS-CoV-2 target nucleic  acids are NOT DETECTED.  The SARS-CoV-2 RNA is generally detectable in upper respiratory specimens during the acute phase of infection. The lowest concentration of SARS-CoV-2 viral copies this assay can detect is 138 copies/mL. A negative result does not preclude SARS-Cov-2 infection and should not be used as the sole basis for treatment or other patient management decisions. A negative result may occur with  improper specimen collection/handling, submission of specimen other than nasopharyngeal swab, presence of viral mutation(s) within the areas targeted by this assay, and inadequate number of viral copies(<138 copies/mL). A negative result must be combined with clinical observations, patient history, and epidemiological information. The expected result is Negative.  Fact Sheet for Patients:  EntrepreneurPulse.com.au  Fact Sheet for Healthcare Providers:  IncredibleEmployment.be  This test is no t yet approved or cleared by the [place] FDA and  has been authorized for detection and/or diagnosis of SARS-CoV-2 by FDA under an Emergency Use Authorization (EUA). This EUA will remain  in effect (  meaning this test can be used) for the duration of the COVID-19 declaration under Section 564(b)(1) of the Act, 21 U.S.C.section 360bbb-3(b)(1), unless the authorization is terminated  or revoked sooner.       Influenza A by PCR NEGATIVE NEGATIVE Final   Influenza B by PCR NEGATIVE NEGATIVE Final    Comment: (NOTE) The Xpert Xpress SARS-CoV-2/FLU/RSV plus assay is intended as an aid in the diagnosis of influenza from Nasopharyngeal swab specimens and should not be used as a sole basis for treatment. Nasal washings and aspirates are unacceptable for Xpert Xpress SARS-CoV-2/FLU/RSV testing.  Fact Sheet for Patients: EntrepreneurPulse.com.au  Fact Sheet for Healthcare Providers: IncredibleEmployment.be  This  test is not yet approved or cleared by the [place] FDA and has been authorized for detection and/or diagnosis of SARS-CoV-2 by FDA under an Emergency Use Authorization (EUA). This EUA will remain in effect (meaning this test can be used) for the duration of the COVID-19 declaration under Section 564(b)(1) of the Act, 21 U.S.C. section 360bbb-3(b)(1), unless the authorization is terminated or revoked.  Performed at [facility], [address], [place], [place] [address]   Surgical PCR screen     Status: None   Collection Time: 08/04/21 11:08 AM   Specimen: Nasal Mucosa; Nasal Swab  Result Value Ref Range Status   MRSA, PCR NEGATIVE NEGATIVE Final   Staphylococcus aureus NEGATIVE NEGATIVE Final    Comment: (NOTE) The Xpert SA Assay (FDA approved for NASAL specimens in patients 9 years of age and older), is one component of a comprehensive surveillance program. It is not intended to diagnose infection nor to guide or monitor treatment. Performed at [facility], [address], [place], [place] [address]      Scheduled Meds:  amLODipine  5 mg Oral Daily   enoxaparin (LOVENOX) injection  40 mg Subcutaneous Daily   feeding supplement  237 mL Oral BID BM   FLUoxetine  40 mg Oral Daily    HYDROmorphone (DILAUDID) injection  0.5 mg Intravenous Once   insulin aspart  0-5 Units Subcutaneous QHS   insulin aspart  0-9 Units Subcutaneous TID WC   levothyroxine  25 mcg Oral QAC breakfast   mirtazapine  15 mg Oral QHS   multivitamin with minerals  1 tablet Oral Daily   polyethylene glycol  17 g Oral Daily   traZODone  150 mg Oral QHS   Continuous Infusions:  Procedures/Studies: CT ABDOMEN PELVIS W CONTRAST  Result Date: 07/07/2021 CLINICAL DATA:  Acute abdominal pain. EXAM: CT ABDOMEN AND PELVIS WITH CONTRAST TECHNIQUE: Multidetector CT imaging of the abdomen and pelvis was performed using the standard protocol following bolus administration of intravenous contrast. RADIATION DOSE  REDUCTION: This exam was performed according to the departmental dose-optimization program which includes automated exposure control, adjustment of the mA and/or kV according to patient size and/or use of iterative reconstruction technique. CONTRAST:  136mL OMNIPAQUE IOHEXOL 300 MG/ML  SOLN COMPARISON:  None. FINDINGS: Lower chest: There are nonspecific reticular opacities in the lung bases. Hepatobiliary: No focal liver abnormality is seen. No gallstones, gallbladder wall thickening, or biliary dilatation. Pancreas: Unremarkable. No pancreatic ductal dilatation or surrounding inflammatory changes. Spleen: Normal in size without focal abnormality. Adrenals/Urinary Tract: Adrenal glands are unremarkable. Kidneys are normal, without renal calculi, focal lesion, or hydronephrosis. Bladder is unremarkable. Stomach/Bowel: Stomach is within normal limits. Appendix appears normal. No bowel obstruction. There is some rectal wall thickening with mild surrounding inflammatory stranding and small perirectal lymph nodes. There is colonic diverticulosis  without evidence for acute diverticulitis. There is moderate stool burden. Vascular/Lymphatic: No significant vascular findings are present. No enlarged abdominal or pelvic lymph nodes. Reproductive: Status post hysterectomy. No adnexal masses. Other: No abdominal wall hernia or abnormality. No abdominopelvic ascites. Musculoskeletal: L4-L5 posterior fusion hardware is present. No evidence for acute fracture. Multilevel degenerative changes affect the spine IMPRESSION: 1. Findings compatible with proctitis. 2. Colonic diverticulosis without evidence for acute diverticulitis. Electronically Signed   By: Ronney Asters M.D.   On: 07/07/2021 00:48   Chest Portable 1 View  Result Date: 08/03/2021 CLINICAL DATA:  Preop evaluation for hip surgery EXAM: PORTABLE CHEST 1 VIEW COMPARISON:  CT done on 11/03/2013 FINDINGS: Cardiac size is within normal limits. Thoracic aorta is tortuous  there are no signs of pulmonary edema or focal pulmonary consolidation. Left hemidiaphragm is elevated. There is no pleural effusion or pneumothorax. IMPRESSION: There are no signs of pulmonary edema or focal pulmonary consolidation. Electronically Signed   By: Elmer Picker M.D.   On: 08/03/2021 12:19   DG Hip Unilat  With Pelvis 2-3 Views Right  Result Date: 08/03/2021 CLINICAL DATA:  Fall with right hip pain EXAM: DG HIP (WITH OR WITHOUT PELVIS) 2-3V RIGHT COMPARISON:  07/07/2021 abdominal CT FINDINGS: Right femoral neck fracture with displacement and varus deformity. No evidence of pelvic ring fracture or diastasis. Pelvis is rotated to the right. Generalized osteopenia. IMPRESSION: Displaced right femoral neck fracture. Electronically Signed   By: Jorje Guild M.D.   On: 08/03/2021 08:49    Orson Eva, DO  [facility]  If 7PM-7AM, please contact night-coverage [email or web address] Password [email or web address] 08/04/2021, 5:09 PM   LOS: 1 day

## 2021-08-04 NOTE — Progress Notes (Signed)
° °  ORTHOPAEDIC PROGRESS NOTE  Scheduled for Right Hip hemiarthroplasty  DOS: 08/05/21  SUBJECTIVE: No issues over night.  Family at bedside.  Right hip is painful with movement.   OBJECTIVE: PE:  Alert and oriented, no acute distress  Toes are warm and well perfused Sensation is intact to the dorsum of her foot.   Active motion intact in the TA/EHL. Right hip and external rotation position. Range of motion deferred due to known fracture. No ecchymosis is appreciated over the lateral hip.  Vitals:   08/04/21 0548 08/04/21 1423  BP: (!) 114/59 (!) 115/55  Pulse: 82 75  Resp: 18 18  Temp: 99.1 F (37.3 C) 98.2 F (36.8 C)  SpO2: 95% 94%   CBC Latest Ref Rng & Units 08/04/2021 08/03/2021 07/06/2021  WBC 4.0 - 10.5 K/uL 8.6 9.5 7.3  Hemoglobin 12.0 - 15.0 g/dL 11.7(L) 12.3 13.8  Hematocrit 36.0 - 46.0 % 38.1 38.4 43.3  Platelets 150 - 400 K/uL 199 211 263     ASSESSMENT: Yolanda Franco is a 88 y.o. female doing well.  Ready for OR.  NPO at midnight.  Surgery scheduled for 08/05/2021  PLAN: Weightbearing: NWB RLE Insicional and dressing care: Reinforce dressings as needed; none currently Orthopedic device(s): None VTE prophylaxis: Recommend Aspirin 81mg  BID; to begin POD#1 Pain control: PO pain medications as needed; judicious use of narcotics Follow - up plan: 2 weeks   Contact information:     Tahje Borawski A. Amedeo Kinsman, MD [facility] [address] [place],  [place]  [address] Phone: [phone] Fax: [phone]

## 2021-08-04 NOTE — Progress Notes (Signed)
DOB: Jun 23, 1932 Date: 08/04/2021   Must ID: [id number]   To Whom it May Concern:  Please be advised that the above named patient will require a short-term nursing home stay- anticipated 30 days or less rehabilitation and strengthening. The plan is for return home.

## 2021-08-04 NOTE — TOC Initial Note (Signed)
Transition of Care [facility]) - Initial/Assessment Note    Patient Details  Name: Yolanda Franco MRN: [id number] Date of Birth: 06/16/1932  Transition of [facility]) CM/SW Contact:    Salome Arnt, LCSW Phone Number: 08/04/2021, 1:10 PM  Clinical Narrative:  Pt admitted due to right hip fracture. LCSW attempted to complete assessment with pt, but pt deferred to son, Chrissie Noa. Per son, pt lives with her niece and is fairly independent at baseline. LCSW discussed d/c plan with Chrissie Noa who reports they have already discussed need for SNF. They are interested in several facilities in [place]. LCSW will initiate bed search today and will start authorization after PT evaluation. Surgery scheduled for tomorrow.                Expected Discharge Plan: Skilled Nursing Facility Barriers to Discharge: Continued Medical Work up   Patient Goals and CMS Choice Patient states their goals for this hospitalization and ongoing recovery are:: short term rehab   Choice offered to / list presented to : Adult Children  Expected Discharge Plan and Services Expected Discharge Plan: [place] In-house Referral: Clinical Social Work   Post Acute Care Choice: [place] Living arrangements for the past 2 months: [place]                                      Prior Living Arrangements/Services Living arrangements for the past 2 months: Single Family Home Lives with:: Relatives Patient language and need for interpreter reviewed:: Yes Do you feel safe going back to the place where you live?: Yes      Need for Family Participation in Patient Care: No (Comment)     Criminal Activity/Legal Involvement Pertinent to Current Situation/Hospitalization: No - Comment as needed  Activities of Daily Living Home Assistive Devices/Equipment: Eyeglasses, Environmental consultant (specify type), Dentures (specify type), Cane (specify quad or straight), Hearing aid ADL Screening  (condition at time of admission) Patient's cognitive ability adequate to safely complete daily activities?: Yes Is the patient deaf or have difficulty hearing?: No Does the patient have difficulty seeing, even when wearing glasses/contacts?: Yes Does the patient have difficulty concentrating, remembering, or making decisions?: No Patient able to express need for assistance with ADLs?: Yes Does the patient have difficulty dressing or bathing?: Yes Independently performs ADLs?: No Communication: Independent Dressing (OT): Needs assistance (due to hip fx) Is this a change from baseline?: Pre-admission baseline Grooming: Needs assistance (due to hip fx) Is this a change from baseline?: Pre-admission baseline Feeding: Independent Bathing: Needs assistance Is this a change from baseline?: Pre-admission baseline (due to hip fx) Toileting: Needs assistance (due to hip fx) Is this a change from baseline?: Pre-admission baseline (due to hip fx) In/Out Bed: Needs assistance (due to hip fx) Is this a change from baseline?: Change from baseline, expected to last <3 days Walks in Home:  (due to hip fx) Does the patient have difficulty walking or climbing stairs?: Yes Weakness of Legs: Right Weakness of Arms/Hands: None  Permission Sought/Granted   Permission granted to share information with : Yes, Verbal Permission Granted  Share Information with NAME: Chrissie Noa     Permission granted to share info w Relationship: son     Emotional Assessment         Alcohol / Substance Use: Not Applicable Psych Involvement: No (comment)  Admission diagnosis:  Preop cardiovascular exam [Z01.810] Closed displaced fracture  of right femoral neck ([place]) [S72.001A] S/P right hip fracture [Z87.81] Patient Active Problem List   Diagnosis Date Noted   Closed displaced fracture of right femoral neck ([place]) 08/03/2021   Paroxysmal atrial fibrillation ([place]) 08/03/2021   Depression 08/03/2021   Hypothyroidism  08/03/2021   Essential hypertension 08/03/2021   Dizzy spells 10/27/2012   Shaking spells 10/27/2012   Weakness 10/27/2012   PCP:  [name], MD Pharmacy:   [facility], [place] [address] Phone: [phone] Fax: [phone]  [id number] - [place], [place] [address] [place] [address] Phone: [phone] Fax: [phone]     Social Determinants of Health (SDOH) Interventions    Readmission Risk Interventions No flowsheet data found.

## 2021-08-05 ENCOUNTER — Inpatient Hospital Stay (HOSPITAL_COMMUNITY): Payer: Medicare Other

## 2021-08-05 ENCOUNTER — Encounter (HOSPITAL_COMMUNITY)
Admission: EM | Disposition: A | Discharge status: Skilled Nursing Facility | Payer: Self-pay | Source: Home / Self Care | Attending: Internal Medicine

## 2021-08-05 ENCOUNTER — Other Ambulatory Visit: Payer: Self-pay

## 2021-08-05 ENCOUNTER — Encounter (HOSPITAL_COMMUNITY): Payer: Self-pay | Admitting: Internal Medicine

## 2021-08-05 ENCOUNTER — Inpatient Hospital Stay (HOSPITAL_COMMUNITY): Payer: Medicare Other | Admitting: Anesthesiology

## 2021-08-05 DIAGNOSIS — I4891 Unspecified atrial fibrillation: Secondary | ICD-10-CM

## 2021-08-05 DIAGNOSIS — I10 Essential (primary) hypertension: Secondary | ICD-10-CM

## 2021-08-05 DIAGNOSIS — S72001A Fracture of unspecified part of neck of right femur, initial encounter for closed fracture: Secondary | ICD-10-CM

## 2021-08-05 DIAGNOSIS — E119 Type 2 diabetes mellitus without complications: Secondary | ICD-10-CM

## 2021-08-05 HISTORY — PX: HIP ARTHROPLASTY: SHX981

## 2021-08-05 LAB — GLUCOSE, CAPILLARY
Glucose-Capillary: 113 mg/dL — ABNORMAL HIGH (ref 70–99)
Glucose-Capillary: 141 mg/dL — ABNORMAL HIGH (ref 70–99)
Glucose-Capillary: 138 mg/dL — ABNORMAL HIGH (ref 70–99)
Glucose-Capillary: 165 mg/dL — ABNORMAL HIGH (ref 70–99)

## 2021-08-05 LAB — TYPE AND SCREEN
ABO/RH(D): A POS
Antibody Screen: NEGATIVE

## 2021-08-05 SURGERY — HEMIARTHROPLASTY, HIP, DIRECT ANTERIOR APPROACH, FOR FRACTURE
Anesthesia: General | Site: Hip | Laterality: Right

## 2021-08-05 MED ORDER — CEFAZOLIN SODIUM-DEXTROSE 2-4 GM/100ML-% IV SOLN
2.0000 g | Freq: Three times a day (TID) | INTRAVENOUS | Status: AC
Start: 2021-08-05 — End: 2021-08-06
  Administered 2021-08-05 – 2021-08-06 (×3): 2 g via INTRAVENOUS
  Filled 2021-08-05 (×3): qty 100

## 2021-08-05 MED ORDER — 0.9 % SODIUM CHLORIDE (POUR BTL) OPTIME
TOPICAL | Status: DC | PRN
  Administered 2021-08-05: 1000 mL

## 2021-08-05 MED ORDER — PROPOFOL 10 MG/ML IV BOLUS
INTRAVENOUS | Status: AC
  Filled 2021-08-05: qty 20

## 2021-08-05 MED ORDER — BUPIVACAINE HCL (PF) 0.5 % IJ SOLN
INTRAMUSCULAR | Status: AC
  Filled 2021-08-05: qty 30

## 2021-08-05 MED ORDER — CEFAZOLIN SODIUM-DEXTROSE 2-4 GM/100ML-% IV SOLN
2.0000 g | INTRAVENOUS | Status: AC
  Administered 2021-08-05: 2 g via INTRAVENOUS
  Filled 2021-08-05: qty 100

## 2021-08-05 MED ORDER — HYDROMORPHONE HCL 1 MG/ML IJ SOLN
0.2500 mg | INTRAMUSCULAR | Status: DC | PRN
  Administered 2021-08-05 (×2): 0.5 mg via INTRAVENOUS
  Filled 2021-08-05: qty 0.5

## 2021-08-05 MED ORDER — FENTANYL CITRATE PF 50 MCG/ML IJ SOSY
PREFILLED_SYRINGE | INTRAMUSCULAR | Status: AC
  Filled 2021-08-05: qty 1

## 2021-08-05 MED ORDER — ONDANSETRON HCL 4 MG/2ML IJ SOLN
INTRAMUSCULAR | Status: AC
  Filled 2021-08-05: qty 2

## 2021-08-05 MED ORDER — SODIUM CHLORIDE 0.9 % IR SOLN
Status: DC | PRN
  Administered 2021-08-05: 3000 mL

## 2021-08-05 MED ORDER — EPHEDRINE 5 MG/ML INJ
INTRAVENOUS | Status: AC
  Filled 2021-08-05: qty 5

## 2021-08-05 MED ORDER — BUPIVACAINE HCL (PF) 0.5 % IJ SOLN
INTRAMUSCULAR | Status: DC | PRN
  Administered 2021-08-05: 30 mL

## 2021-08-05 MED ORDER — CHLORHEXIDINE GLUCONATE CLOTH 2 % EX PADS
6.0000 | MEDICATED_PAD | Freq: Once | CUTANEOUS | Status: AC
  Administered 2021-08-05: 6 via TOPICAL

## 2021-08-05 MED ORDER — LIDOCAINE HCL (PF) 2 % IJ SOLN
INTRAMUSCULAR | Status: AC
  Filled 2021-08-05: qty 5

## 2021-08-05 MED ORDER — TRANEXAMIC ACID-NACL 1000-0.7 MG/100ML-% IV SOLN
1000.0000 mg | INTRAVENOUS | Status: AC
  Administered 2021-08-05: 1000 mg via INTRAVENOUS
  Filled 2021-08-05: qty 100

## 2021-08-05 MED ORDER — FENTANYL CITRATE (PF) 100 MCG/2ML IJ SOLN
INTRAMUSCULAR | Status: AC
  Filled 2021-08-05: qty 2

## 2021-08-05 MED ORDER — FENTANYL CITRATE (PF) 100 MCG/2ML IJ SOLN
INTRAMUSCULAR | Status: DC | PRN
  Administered 2021-08-05 (×5): 25 ug via INTRAVENOUS

## 2021-08-05 MED ORDER — LACTATED RINGERS IV BOLUS
500.0000 mL | Freq: Once | INTRAVENOUS | Status: AC
  Administered 2021-08-05: 500 mL via INTRAVENOUS

## 2021-08-05 MED ORDER — ROCURONIUM 10MG/ML (10ML) SYRINGE FOR MEDFUSION PUMP - OPTIME
INTRAVENOUS | Status: DC | PRN
Start: 2021-08-05 — End: 2021-08-05
  Administered 2021-08-05: 30 mg via INTRAVENOUS
  Administered 2021-08-05: 20 mg via INTRAVENOUS

## 2021-08-05 MED ORDER — LACTATED RINGERS IV SOLN: INTRAVENOUS | Status: DC

## 2021-08-05 MED ORDER — FENTANYL CITRATE PF 50 MCG/ML IJ SOSY
50.0000 ug | PREFILLED_SYRINGE | Freq: Once | INTRAMUSCULAR | Status: AC
  Administered 2021-08-05: 50 ug via INTRAVENOUS

## 2021-08-05 MED ORDER — SUGAMMADEX SODIUM 200 MG/2ML IV SOLN
INTRAVENOUS | Status: DC | PRN
  Administered 2021-08-05: 100 mg via INTRAVENOUS

## 2021-08-05 MED ORDER — CHLORHEXIDINE GLUCONATE CLOTH 2 % EX PADS
6.0000 | MEDICATED_PAD | Freq: Every day | CUTANEOUS | Status: DC
  Administered 2021-08-05 – 2021-08-07 (×3): 6 via TOPICAL

## 2021-08-05 MED ORDER — PROPOFOL 10 MG/ML IV BOLUS
INTRAVENOUS | Status: DC | PRN
  Administered 2021-08-05: 100 mg via INTRAVENOUS

## 2021-08-05 MED ORDER — EPHEDRINE SULFATE (PRESSORS) 50 MG/ML IJ SOLN
INTRAMUSCULAR | Status: DC | PRN
  Administered 2021-08-05: 10 mg via INTRAVENOUS

## 2021-08-05 MED ORDER — VANCOMYCIN HCL 1000 MG IV SOLR
INTRAVENOUS | Status: AC
  Filled 2021-08-05: qty 20

## 2021-08-05 MED ORDER — VANCOMYCIN HCL 1 G IV SOLR
INTRAVENOUS | Status: DC | PRN
  Administered 2021-08-05: 1000 mg

## 2021-08-05 MED ORDER — HYDROMORPHONE HCL 1 MG/ML IJ SOLN: 0.2500 mg | INTRAMUSCULAR | Status: DC | PRN

## 2021-08-05 MED ORDER — LIDOCAINE HCL (CARDIAC) PF 50 MG/5ML IV SOSY
PREFILLED_SYRINGE | INTRAVENOUS | Status: DC | PRN
Start: 2021-08-05 — End: 2021-08-05
  Administered 2021-08-05: 60 mg via INTRAVENOUS

## 2021-08-05 MED ORDER — ORAL CARE MOUTH RINSE: 15.0000 mL | Freq: Once | OROMUCOSAL | Status: AC

## 2021-08-05 MED ORDER — CHLORHEXIDINE GLUCONATE 0.12 % MT SOLN
15.0000 mL | Freq: Once | OROMUCOSAL | Status: AC
  Administered 2021-08-05: 15 mL via OROMUCOSAL

## 2021-08-05 MED ORDER — BUPIVACAINE-EPINEPHRINE (PF) 0.5% -1:200000 IJ SOLN
INTRAMUSCULAR | Status: AC
  Filled 2021-08-05: qty 30

## 2021-08-05 SURGICAL SUPPLY — 58 items
APL PRP STRL LF DISP 70% ISPRP (MISCELLANEOUS) ×1
BIPOLAR DEPUY 47 (Hips) ×2 IMPLANT
BIT DRILL 2.8X128 (BIT) ×2 IMPLANT
BLADE SAGITTAL 25.0X1.27X90 (BLADE) ×2 IMPLANT
BRUSH FEMORAL CANAL (MISCELLANEOUS) ×2 IMPLANT
CHLORAPREP W/TINT 26 (MISCELLANEOUS) ×2 IMPLANT
CLOTH BEACON ORANGE TIMEOUT ST (SAFETY) ×2 IMPLANT
COVER LIGHT HANDLE STERIS (MISCELLANEOUS) ×4 IMPLANT
DRAPE HIP W/POCKET STRL (MISCELLANEOUS) ×2 IMPLANT
DRAPE SURG 17X23 STRL (DRAPES) ×2 IMPLANT
DRAPE U-SHAPE 47X51 STRL (DRAPES) ×2 IMPLANT
DRESSING AQUACEL AG ADV 3.5X12 (MISCELLANEOUS) ×1 IMPLANT
DRSG AQUACEL AG ADV 3.5X12 (MISCELLANEOUS) ×2
DRSG MEPILEX SACRM 8.7X9.8 (GAUZE/BANDAGES/DRESSINGS) ×2 IMPLANT
ELECT REM PT RETURN 9FT ADLT (ELECTROSURGICAL) ×2
ELECTRODE REM PT RTRN 9FT ADLT (ELECTROSURGICAL) ×1 IMPLANT
GLOVE SRG 8 PF TXTR STRL LF DI (GLOVE) ×1 IMPLANT
GLOVE SURG LTX SZ6.5 (GLOVE) ×1 IMPLANT
GLOVE SURG POLYISO LF SZ8 (GLOVE) ×9 IMPLANT
GLOVE SURG UNDER POLY LF SZ7 (GLOVE) ×6 IMPLANT
GLOVE SURG UNDER POLY LF SZ8 (GLOVE) ×4
GOWN STRL REUS W/ TWL XL LVL3 (GOWN DISPOSABLE) ×1 IMPLANT
GOWN STRL REUS W/TWL LRG LVL3 (GOWN DISPOSABLE) ×4 IMPLANT
GOWN STRL REUS W/TWL XL LVL3 (GOWN DISPOSABLE) ×2
HANDPIECE INTERPULSE COAX TIP (DISPOSABLE) ×2
HEAD BIPOLAR DEPUY 47 (Hips) IMPLANT
HEAD FEM STD 28X+1.5 STRL (Hips) ×1 IMPLANT
INST SET MAJOR BONE (KITS) ×2 IMPLANT
IV NS IRRIG 3000ML ARTHROMATIC (IV SOLUTION) ×2 IMPLANT
KIT BLADEGUARD II DBL (SET/KITS/TRAYS/PACK) ×2 IMPLANT
KIT TURNOVER KIT A (KITS) ×2 IMPLANT
MANIFOLD NEPTUNE II (INSTRUMENTS) ×2 IMPLANT
MARKER SKIN DUAL TIP RULER LAB (MISCELLANEOUS) ×2 IMPLANT
NDL HYPO 18GX1.5 BLUNT FILL (NEEDLE) ×1 IMPLANT
NDL HYPO 21X1.5 SAFETY (NEEDLE) ×1 IMPLANT
NDL MA TROC 1/2 (NEEDLE) IMPLANT
NEEDLE HYPO 18GX1.5 BLUNT FILL (NEEDLE) ×2 IMPLANT
NEEDLE HYPO 21X1.5 SAFETY (NEEDLE) ×2 IMPLANT
NEEDLE MA TROC 1/2 (NEEDLE) ×2 IMPLANT
NS IRRIG 1000ML POUR BTL (IV SOLUTION) ×2 IMPLANT
PACK SURGICAL SETUP 50X90 (CUSTOM PROCEDURE TRAY) ×2 IMPLANT
PACK TOTAL JOINT (CUSTOM PROCEDURE TRAY) ×2 IMPLANT
PAD ARMBOARD 7.5X6 YLW CONV (MISCELLANEOUS) ×2 IMPLANT
SET BASIN LINEN APH (SET/KITS/TRAYS/PACK) ×2 IMPLANT
SET HNDPC FAN SPRY TIP SCT (DISPOSABLE) ×1 IMPLANT
SPONGE T-LAP 18X18 ~~LOC~~+RFID (SPONGE) ×2 IMPLANT
STEM SUMMIT PRESSFIT SZ 6 (Hips) ×1 IMPLANT
STRIP CLOSURE SKIN 1/2X4 (GAUZE/BANDAGES/DRESSINGS) ×4 IMPLANT
SUT BRALON NAB BRD #1 30IN (SUTURE) ×2 IMPLANT
SUT MNCRL AB 4-0 PS2 18 (SUTURE) ×1 IMPLANT
SUT MON AB 2-0 CT1 36 (SUTURE) ×3 IMPLANT
SUT VIC AB 1 CT1 27 (SUTURE) ×10
SUT VIC AB 1 CT1 27XBRD ANTBC (SUTURE) ×4 IMPLANT
SYR 30ML LL (SYRINGE) ×4 IMPLANT
SYR BULB IRRIG 60ML STRL (SYRINGE) ×4 IMPLANT
TRAY FOLEY MTR SLVR 16FR STAT (SET/KITS/TRAYS/PACK) ×2 IMPLANT
WATER STERILE IRR 1000ML POUR (IV SOLUTION) ×4 IMPLANT
YANKAUER SUCT 12FT TUBE ARGYLE (SUCTIONS) ×2 IMPLANT

## 2021-08-05 NOTE — Op Note (Signed)
Orthopaedic Surgery Operative Note (CSN: [id number])  Yolanda Franco  [birth year] Date of Surgery: 08/05/2021   Diagnoses:  Right femoral neck fracture  Procedure: Right hip hemiarthroplasty   Operative Finding Successful completion of the planned procedure.  Pressfit right hip hemiarthroplasty, sizes listed below.  Stable to 90 degrees of flexion and 50 degrees of internal rotation    Post-Op Diagnosis: Same Surgeons:Primary: Mordecai Rasmussen, MD Assistants: Marquita Palms Location: [facility] OR ROOM 4 Anesthesia: General with local anesthesia Antibiotics: Ancef 2 g with local vancomycin powder 1 g at the surgical site Tourniquet time: N/A Estimated Blood Loss: 678 cc Complications: None Specimens: None Implants: Implant Name Type Inv. Item Serial No. Manufacturer Lot No. LRB No. Used Action  STEM SUMMIT PRESSFIT SZ 6 - [id number] Hips STEM SUMMIT PRESSFIT SZ 6  [employer]PU[employer] ORTH[employer] [id number] Right 1 Implanted  HEAD FEM STD 28X+1.5 STRL - [id number] Hips HEAD FEM STD 28X+1.5 STRL  DEPUY ORTHOPA[employer] [id number] Right 1 Implanted  BIPOLAR DEPUY 47MM - [id number] Hips BIPOLAR DEPUY 47MM  DEPUY ORTHOPAEDICS [id number] Right 1 Implanted    Indications for Surgery:   Yolanda Franco is a 88 y.o. female who sustained a displaced Right femoral neck fracture.  In order to restore previous form and function, I recommended a hip hemiarthroplasty.  Risks and benefits of operative and nonoperative management were discussed prior to surgery with the patient and informed consent form was completed.  Specific risks including infection, need for additional surgery, bleeding, damage to surrounding structures, dislocation, nonunion of the implants and more severe complications associated with anesthesia.  The patient elected to proceed.    Procedure:   The patient was identified properly. Informed consent was obtained and the surgical site was marked. The patient was taken to the OR where general anesthesia was  induced.  The patient was positioned lateral, with her right hip up.  The right leg was prepped and draped in the usual sterile fashion.  Timeout was performed before the beginning of the case.  Ancef dosing was confirmed prior to incision.  Patient received 1 g of TXA before the start of the case  We started by making an incision centered over the greater trochanter with a scalpel. We used the scalpel to continue to dissect to the fascia.  Electrocautery was used to help with hemostasis.  A cobb elevator was used to clear the IT band and then it was pierced with Bovie electrocautery. Mayo scissors were used to cut the fascia in a longitudinal fashion. The gluteus maximus fibers were bluntly split in line with their fibers.   The femur was slowly internally rotated, putting tension on the posterior structures. The short external rotators were identified and tagged with #1 vicryl.  Bovie electrocautery was used to dissect the short external rotators off of the insertion onto the femur.  Next, we identified the capsule and made a T capsulotomy.  The capsule was then tagged with #1 Vicryl sutures.   Next, we visualized the femoral neck fracture. We identified the sciatic nerve by palpation and verified that it was not in danger during the dissection.    We then made a neck cut approximately 1.5 cm above the lesser trochanter with a saw. We removed the femoral head. We used the box cutter to cut away some of the greater trochanter for ease of insertion of the stem. We then used the canal finder to locate the femoral canal.   Using the angle of the femoral neck as our  guide for version, we broached sequentially. We trialed components and found appropriate fit and stability.  The patient's bone was stable enough to get an excellent pressfit with the stem.  We placed an appropriately sized head on the stem.  The hip was reduced and we noted full extension of the hip. The hip was stable at 90 degrees of flexion, and  45 degrees of internal rotation.  Leg lengths were approximately equal.  We then removed the trials as well as the broach. We ensured there were no foreign bodies or bone within the acetabulum. We copiously irrigated the wound.    The appropriately sized stem was then opened and this was pressfit within the canal.  We achieved excellent fit and the stem was stable.  An appropriately sized head was placed on to the stem and the hip was reduced. Again, we noted it was stable in the previously mentioned manipulations.   The wound was copiously irrigated again.  We placed 1 g of Vancomycin powder within the hip.  Next,  we repaired the posterior capsule.  The short external rotators were repaired to the greater trochanter through bone tunnels. We closed the fascia of the iliotibial band and gluteus maximus with running and intterupted Vicryl sutures. We then closed Scarpa's fascia with Vicryl sutures. Skin was closed with 2-0 monocryl and 4-0 Monocryl with Steri-Strips and an Aquacel dressing was placed.  A hib abduction pillow was secured.  Patient was awoken taken to PACU in stable condition.   Post-operative plan:  The patient will be WBAT on the operative extremity. Hip abduction pillow in place at all times when in bed Posterior hip precautions PT/OT    DVT prophylaxis per primary team, no orthopedic contraindications.   Ok to resume on POD#1 Pain control with PRN pain medication preferring oral medicines.   Follow up plan will be scheduled in approximately 14 days for incision check and XR.

## 2021-08-05 NOTE — Progress Notes (Signed)
PROGRESS NOTE  Yolanda Franco ZOX:[id number] DOB: 1933-01-26 DOA: 08/03/2021 PCP: [name], MD  Brief History:  Female with a history of hypertension, atrial fibrillation on apixaban, interstitial cystitis, anxiety/depression, diabetes mellitus type 2, and hypothyroidism presenting with mechanical fall.  The patient was getting up from her commode transfer back to her bed when she slipped and fell onto her right side and buttocks in the early morning of 08/03/2021.  There was no syncope.  The patient has not had any frequent falls, but does use a walker to assist with ambulation.  She denies any recent fevers, chills, headache, neck pain, chest pain, shortness of breath, coughing, hemoptysis, nausea, vomiting, diarrhea, abdominal pain, dysuria, hematuria.  The patient did recently have a colonoscopy performed on 07/30/2021 which showed numerous polyps.  She has a follow-up appointment with her gastroenterologist on 08/06/2021.  She was not started on any new medications after her colonoscopy.  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  There is no hematochezia or melena.  She denies any previous history of stroke or coronary disease or MI. In the ED, the patient was afebrile hemodynamically stable with oxygen saturation 92% on room air. BMP showed sodium 142, potassium 3.6, serum creatinine 0.46.  LFTs were unremarkable.  WBC 9.5, hemoglobin 12.3, platelets 211,000.  EKG shows sinus rhythm and nonspecific T wave changes.  X-ray of the pelvis that showed a displaced right femoral neck fracture.  Orthopedics, Dr. Amedeo Kinsman was consulted to assist.  Operative repair planned for 08/05/21 to allow for apixaban washout.    Assessment and Plan: * Closed displaced fracture of right femoral neck ([place])- (present on admission) Ortho  consulted--Dr. Amedeo Kinsman Operative repair planned on 08/05/21 Judicious opioids Ok medically for surgery presently Resume apixaban post-op when okay with ortho service PT post op  Paroxysmal atrial fibrillation (HCC) Currently in sinus Personally reviewed EKG--sinus, nonspecific TWI Holding apixaban for surgery Resume apixaban post-op when ok with ortho service  Essential hypertension Continue amlodpine  Hypothyroidism Continue levothyroxine  Depression Continue fluoxetine, trazodone and mirtazipine  Status is: Inpatient Remains inpatient appropriate because: severity of illness requiring operative intervention for femoral neck fracture                       Family Communication:   son updated at bedside 2/26   Consultants:  ortho--[place]   Code Status:  FULL    DVT Prophylaxis:  apixaban on hold      Procedures: As Listed in Progress Note Above   Antibiotics: None              Subjective: Patient complains of pain in right hip with movement.  Othervise pain is controlled.  Denies f/c, cp, sob, n/v/d, cough, abd pain, dysuria  Objective: Vitals:   08/04/21 0548 08/04/21 1423 08/04/21 2143 08/05/21 0525  BP: (!) 114/59 (!) 115/55 (!) 121/57 121/69  Pulse: 82 75 71 66  Resp: 18 18 18 18   Temp: 99.1 F (37.3 C) 98.2 F (36.8 C) 99.2 F (37.3 C) 99.6 F (37.6 C)  TempSrc: Oral  Oral Oral  SpO2: 95% 94% 93% 94%  Weight:      Height:        Intake/Output Summary (Last 24 hours) at 08/05/2021 8119 Last  data filed at 08/04/2021 1900 Gross per 24 hour  Intake 660 ml  Output --  Net 660 ml   Weight change:  Exam:  General:  Pt is alert, follows commands appropriately, not in acute distress HEENT: No icterus, No thrush, No neck mass, [place]/AT Cardiovascular: RRR, S1/S2, no rubs, no gallops Respiratory: bibasilar crackles. No wheeze Abdomen: Soft/+BS, non tender, non distended, no guarding Extremities: No edema, No lymphangitis, No petechiae,  No rashes, no synovitis   Data Reviewed: I have personally reviewed following labs and imaging studies Basic Metabolic Panel: Recent Labs  Lab 08/03/21 0926 08/03/21 0940 08/04/21 0536  NA 142  --  136  K 3.6  --  4.0  CL 109  --  103  CO2 27  --  26  GLUCOSE 95  --  148*  BUN 9  --  17  CREATININE 0.46  --  0.73  CALCIUM 8.6*  --  8.6*  MG  --  2.0 2.0   Liver Function Tests: Recent Labs  Lab 08/03/21 0926  AST 19  ALT 18  ALKPHOS 49  BILITOT 0.3  PROT 6.3*  ALBUMIN 3.4*   No results for input(s): LIPASE, AMYLASE in the last 168 hours. No results for input(s): AMMONIA in the last 168 hours. Coagulation Profile: No results for input(s): INR, PROTIME in the last 168 hours. CBC: Recent Labs  Lab 08/03/21 0926 08/04/21 0536  WBC 9.5 8.6  NEUTROABS 8.2*  --   HGB 12.3 11.7*  HCT 38.4 38.1  MCV 93.0 93.4  PLT 211 199   Cardiac Enzymes: No results for input(s): CKTOTAL, CKMB, CKMBINDEX, TROPONINI in the last 168 hours. BNP: Invalid input(s): POCBNP CBG: Recent Labs  Lab 08/04/21 0722 08/04/21 1138 08/04/21 1642 08/04/21 2100  GLUCAP 118* 106* 119* 118*   HbA1C: Recent Labs    08/04/21 0536  HGBA1C 5.2   Urine analysis:    Component Value Date/Time   COLORURINE YELLOW 07/06/2021 0153   APPEARANCEUR CLEAR 07/06/2021 0153   LABSPEC 1.010 07/06/2021 0153   PHURINE 7.0 07/06/2021 0153   GLUCOSEU NEGATIVE 07/06/2021 0153   HGBUR NEGATIVE 07/06/2021 0153   BILIRUBINUR NEGATIVE 07/06/2021 0153   KETONESUR NEGATIVE 07/06/2021 0153   PROTEINUR NEGATIVE 07/06/2021 0153   NITRITE NEGATIVE 07/06/2021 0153   LEUKOCYTESUR TRACE (A) 07/06/2021 0153   Sepsis Labs: @LABRCNTIP (procalcitonin:4,lacticidven:4) ) Recent Results (from the past 240 hour(s))  Resp Panel by RT-PCR (Flu A&B, Covid) Nasopharyngeal Swab     Status: None   Collection Time: 08/03/21  8:23 AM   Specimen: Nasopharyngeal Swab; Nasopharyngeal(NP) swabs in vial transport medium  Result  Value Ref Range Status   SARS Coronavirus 2 by RT PCR NEGATIVE NEGATIVE Final    Comment: (NOTE) SARS-CoV-2 target nucleic acids are NOT DETECTED.  The SARS-CoV-2 RNA is generally detectable in upper respiratory specimens during the acute phase of infection. The lowest concentration of SARS-CoV-2 viral copies this assay can detect is 138 copies/mL. A negative result does not preclude SARS-Cov-2 infection and should not be used as the sole basis for treatment or other patient management decisions. A negative result may occur with  improper specimen collection/handling, submission of specimen other than nasopharyngeal swab, presence of viral mutation(s) within the areas targeted by this assay, and inadequate number of viral copies(<138 copies/mL). A negative result must be combined with clinical observations, patient history, and epidemiological information. The expected result is Negative.  Fact Sheet for Patients:  EntrepreneurPulse.com.au  Fact Sheet for Healthcare Providers:  IncredibleEmployment.be  This test is no t yet approved or cleared by the [place] and  has been authorized for detection and/or diagnosis of SARS-CoV-2 by FDA under an Emergency Use Authorization (EUA). This EUA will remain  in effect (meaning this test can be used) for the duration of the COVID-19 declaration under Section 564(b)(1) of the Act, 21 U.S.C.section 360bbb-3(b)(1), unless the authorization is terminated  or revoked sooner.       Influenza A by PCR NEGATIVE NEGATIVE Final   Influenza B by PCR NEGATIVE NEGATIVE Final    Comment: (NOTE) The Xpert Xpress SARS-CoV-2/FLU/RSV plus assay is intended as an aid in the diagnosis of influenza from Nasopharyngeal swab specimens and should not be used as a sole basis for treatment. Nasal washings and aspirates are unacceptable for Xpert Xpress SARS-CoV-2/FLU/RSV testing.  Fact Sheet for  Patients: EntrepreneurPulse.com.au  Fact Sheet for Healthcare Providers: IncredibleEmployment.be  This test is not yet approved or cleared by the [place] FDA and has been authorized for detection and/or diagnosis of SARS-CoV-2 by FDA under an Emergency Use Authorization (EUA). This EUA will remain in effect (meaning this test can be used) for the duration of the COVID-19 declaration under Section 564(b)(1) of the Act, 21 U.S.C. section 360bbb-3(b)(1), unless the authorization is terminated or revoked.  Performed at [facility], [address], [place], [place] [address]   Surgical PCR screen     Status: None   Collection Time: 08/04/21 11:08 AM   Specimen: Nasal Mucosa; Nasal Swab  Result Value Ref Range Status   MRSA, PCR NEGATIVE NEGATIVE Final   Staphylococcus aureus NEGATIVE NEGATIVE Final    Comment: (NOTE) The Xpert SA Assay (FDA approved for NASAL specimens in patients 1 years of age and older), is one component of a comprehensive surveillance program. It is not intended to diagnose infection nor to guide or monitor treatment. Performed at [facility], [address] [place], [place] [address]      Scheduled Meds:  amLODipine  5 mg Oral Daily   enoxaparin (LOVENOX) injection  40 mg Subcutaneous Daily   feeding supplement  237 mL Oral BID BM   FLUoxetine  40 mg Oral Daily    HYDROmorphone (DILAUDID) injection  0.5 mg Intravenous Once   insulin aspart  0-5 Units Subcutaneous QHS   insulin aspart  0-9 Units Subcutaneous TID WC   levothyroxine  25 mcg Oral QAC breakfast   mirtazapine  15 mg Oral QHS   multivitamin with minerals  1 tablet Oral Daily   polyethylene glycol  17 g Oral Daily   traZODone  150 mg Oral QHS   Continuous Infusions:  Procedures/Studies: CT ABDOMEN PELVIS W CONTRAST  Result Date: 07/07/2021 CLINICAL DATA:  Acute abdominal pain. EXAM: CT ABDOMEN AND PELVIS WITH CONTRAST TECHNIQUE: Multidetector CT  imaging of the abdomen and pelvis was performed using the standard protocol following bolus administration of intravenous contrast. RADIATION DOSE REDUCTION: This exam was performed according to the departmental dose-optimization program which includes automated exposure control, adjustment of the mA and/or kV according to patient size and/or use of iterative reconstruction technique. CONTRAST:  178mL OMNIPAQUE IOHEXOL 300 MG/ML  SOLN COMPARISON:  None. FINDINGS: Lower chest: There are nonspecific reticular opacities in the lung bases. Hepatobiliary: No focal liver abnormality is seen. No gallstones, gallbladder wall thickening, or biliary dilatation. Pancreas: Unremarkable. No pancreatic ductal dilatation or surrounding inflammatory changes. Spleen: Normal in size without focal abnormality. Adrenals/Urinary Tract: Adrenal glands are unremarkable. Kidneys are normal,  without renal calculi, focal lesion, or hydronephrosis. Bladder is unremarkable. Stomach/Bowel: Stomach is within normal limits. Appendix appears normal. No bowel obstruction. There is some rectal wall thickening with mild surrounding inflammatory stranding and small perirectal lymph nodes. There is colonic diverticulosis without evidence for acute diverticulitis. There is moderate stool burden. Vascular/Lymphatic: No significant vascular findings are present. No enlarged abdominal or pelvic lymph nodes. Reproductive: Status post hysterectomy. No adnexal masses. Other: No abdominal wall hernia or abnormality. No abdominopelvic ascites. Musculoskeletal: L4-L5 posterior fusion hardware is present. No evidence for acute fracture. Multilevel degenerative changes affect the spine IMPRESSION: 1. Findings compatible with proctitis. 2. Colonic diverticulosis without evidence for acute diverticulitis. Electronically Signed   By: Ronney Asters M.D.   On: 07/07/2021 00:48   Chest Portable 1 View  Result Date: 08/03/2021 CLINICAL DATA:  Preop evaluation for hip  surgery EXAM: PORTABLE CHEST 1 VIEW COMPARISON:  CT done on 11/03/2013 FINDINGS: Cardiac size is within normal limits. Thoracic aorta is tortuous there are no signs of pulmonary edema or focal pulmonary consolidation. Left hemidiaphragm is elevated. There is no pleural effusion or pneumothorax. IMPRESSION: There are no signs of pulmonary edema or focal pulmonary consolidation. Electronically Signed   By: Elmer Picker M.D.   On: 08/03/2021 12:19   DG Hip Unilat  With Pelvis 2-3 Views Right  Result Date: 08/03/2021 CLINICAL DATA:  Fall with right hip pain EXAM: DG HIP (WITH OR WITHOUT PELVIS) 2-3V RIGHT COMPARISON:  07/07/2021 abdominal CT FINDINGS: Right femoral neck fracture with displacement and varus deformity. No evidence of pelvic ring fracture or diastasis. Pelvis is rotated to the right. Generalized osteopenia. IMPRESSION: Displaced right femoral neck fracture. Electronically Signed   By: Jorje Guild M.D.   On: 08/03/2021 08:49    Orson Eva, DO  [facility]  If 7PM-7AM, please contact night-coverage [email or web address] Password [email or web address] 08/05/2021, 7:13 AM   LOS: 2 days

## 2021-08-05 NOTE — Transfer of Care (Signed)
Immediate Anesthesia Transfer of Care Note  Patient: Yolanda Franco  Procedure(s) Performed: ARTHROPLASTY BIPOLAR HIP (HEMIARTHROPLASTY) (Right: Hip)  Patient Location: PACU  Anesthesia Type:General  Level of Consciousness: awake  Airway & Oxygen Therapy: Patient Spontanous Breathing and Patient connected to nasal cannula oxygen  Post-op Assessment: Report given to RN  Post vital signs: Reviewed and stable  Last Vitals:  Vitals Value Taken Time  BP 162/91 08/05/21 1200  Temp    Pulse 86 08/05/21 1202  Resp 19 08/05/21 1202  SpO2 96 % 08/05/21 1202  Vitals shown include unvalidated device data.  Last Pain:  Vitals:   08/05/21 0802  TempSrc: Oral  PainSc: 5       Patients Stated Pain Goal: 4 (57/84/69 6295)  Complications: No notable events documented.

## 2021-08-05 NOTE — TOC Progression Note (Signed)
Transition of Care [facility]) - Progression Note    Patient Details  Name: Yolanda Franco MRN: [id number] Date of Birth: [birth year]  [facility] CM/SW Contact  Salome Arnt, [place] Phone Number: 08/05/2021, 10:44 AM  Clinical Narrative:   LCSW presented bed offer at [facility] to pt's son who accepts. Admissions at [facility] notified. Awaiting PT evaluation after surgery to start authorization. TOC will follow. PASRR received.     Expected Discharge Plan: [place] Barriers to Discharge: Continued Medical Work up  Expected Discharge Plan and Services Expected Discharge Plan: [place] In-house Referral: Clinical Social Work   Post Acute Care Choice: [place] Living arrangements for the past 2 months: Single Family Home                                       Social Determinants of Health (SDOH) Interventions    Readmission Risk Interventions No flowsheet data found.

## 2021-08-05 NOTE — Anesthesia Postprocedure Evaluation (Signed)
Anesthesia Post Note  Patient: Yolanda Franco  Procedure(s) Performed: ARTHROPLASTY BIPOLAR HIP (HEMIARTHROPLASTY) (Right: Hip)  Patient location during evaluation: PACU Anesthesia Type: General Level of consciousness: awake and alert and oriented Pain management: pain level controlled Vital Signs Assessment: post-procedure vital signs reviewed and stable Respiratory status: spontaneous breathing, nonlabored ventilation and respiratory function stable Cardiovascular status: blood pressure returned to baseline and stable Postop Assessment: no apparent nausea or vomiting Anesthetic complications: no   No notable events documented.   Last Vitals:  Vitals:   08/05/21 1251 08/05/21 1330  BP:  (!) 160/58  Pulse:  85  Resp:  16  Temp: 36.6 C   SpO2:  96%    Last Pain:  Vitals:   08/05/21 1330  TempSrc:   PainSc: 0-No pain                 Layal Javid C Shakila Mak

## 2021-08-05 NOTE — Anesthesia Preprocedure Evaluation (Signed)
Anesthesia Evaluation  Patient identified by MRN, date of birth, ID band Patient awake    Reviewed: Allergy & Precautions, NPO status , Patient's Chart, lab work & pertinent test results  Airway Mallampati: II  TM Distance: >3 FB Neck ROM: Full    Dental  (+) Edentulous Upper, Edentulous Lower   Pulmonary neg pulmonary ROS,    Pulmonary exam normal breath sounds clear to auscultation       Cardiovascular Exercise Tolerance: Poor hypertension, Pt. on medications + dysrhythmias Atrial Fibrillation  Rhythm:Irregular Rate:Normal + Systolic murmurs    Neuro/Psych  Headaches, PSYCHIATRIC DISORDERS Anxiety Depression    GI/Hepatic negative GI ROS, Neg liver ROS,   Endo/Other  diabetes, Well Controlled, Type 2, Insulin DependentHypothyroidism   Renal/GU negative Renal ROS  negative genitourinary   Musculoskeletal negative musculoskeletal ROS (+)   Abdominal   Peds negative pediatric ROS (+)  Hematology negative hematology ROS (+)   Anesthesia Other Findings   Reproductive/Obstetrics negative OB ROS                           Anesthesia Physical Anesthesia Plan  ASA: 3  Anesthesia Plan: General   Post-op Pain Management: Dilaudid IV   Induction: Intravenous  PONV Risk Score and Plan: 4 or greater and Ondansetron and Dexamethasone  Airway Management Planned: Oral ETT  Additional Equipment:   Intra-op Plan:   Post-operative Plan: Extubation in OR  Informed Consent: I have reviewed the patients History and Physical, chart, labs and discussed the procedure including the risks, benefits and alternatives for the proposed anesthesia with the patient or authorized representative who has indicated his/her understanding and acceptance.       Plan Discussed with: CRNA and Surgeon  Anesthesia Plan Comments: (Patient had back sx, and received eliquis on 08/02/21 at 10 pm. Risks and benefits  Discussed with patient regarding spinal and GA, patient agreed to get GA with ETT.)        Anesthesia Quick Evaluation

## 2021-08-05 NOTE — Progress Notes (Signed)
° °  ORTHOPAEDIC PROGRESS NOTE  Scheduled for Right Hip hemiarthroplasty  DOS: 08/05/21  SUBJECTIVE: No issues over night.  Continues to have pain in the right hip    OBJECTIVE: PE:  Alert and oriented, no acute distress  Toes are warm and well perfused Sensation is intact to the dorsum of her foot.   Active motion intact in the TA/EHL. Right hip and external rotation position. Range of motion deferred due to known fracture. No ecchymosis is appreciated over the lateral hip.  Vitals:   08/05/21 0525 08/05/21 0802  BP: 121/69 136/68  Pulse: 66 74  Resp: 18 (!) 22  Temp: 99.6 F (37.6 C) 99.5 F (37.5 C)  SpO2: 94% 94%   CBC Latest Ref Rng & Units 08/04/2021 08/03/2021 07/06/2021  WBC 4.0 - 10.5 K/uL 8.6 9.5 7.3  Hemoglobin 12.0 - 15.0 g/dL 11.7(L) 12.3 13.8  Hematocrit 36.0 - 46.0 % 38.1 38.4 43.3  Platelets 150 - 400 K/uL 199 211 263     ASSESSMENT: Yolanda Franco is a 88 y.o. female doing well.  Ready for OR.  NPO since midnight.     PLAN: Weightbearing: NWB RLE Insicional and dressing care: Reinforce dressings as needed; none currently Orthopedic device(s): None VTE prophylaxis: Recommend Aspirin 81mg  BID; to begin POD#1 Pain control: PO pain medications as needed; judicious use of narcotics Follow - up plan: 2 weeks   Contact information:     Devlyn Parish A. Amedeo Kinsman, MD [place] [address] [place],  [place]  [address] Phone: [phone] Fax: [phone]

## 2021-08-05 NOTE — Anesthesia Procedure Notes (Addendum)
Procedure Name: Intubation Date/Time: 08/05/2021 9:42 AM Performed by: Ollen Bowl, CRNA Pre-anesthesia Checklist: Patient identified, Patient being monitored, Timeout performed, Emergency Drugs available and Suction available Patient Re-evaluated:Patient Re-evaluated prior to induction Oxygen Delivery Method: Circle system utilized Preoxygenation: Pre-oxygenation with 100% oxygen Induction Type: IV induction Ventilation: Mask ventilation without difficulty Laryngoscope Size: Mac and 3 Grade View: Grade I Tube type: Oral Tube size: 7.0 mm Number of attempts: 1 Airway Equipment and Method: Stylet Placement Confirmation: ETT inserted through vocal cords under direct vision, positive ETCO2 and breath sounds checked- equal and bilateral Secured at: 21 cm Tube secured with: Tape Dental Injury: Teeth and Oropharynx as per pre-operative assessment

## 2021-08-05 NOTE — Progress Notes (Signed)
Pt arrived via stretcher from PACU to room 329. IVF infusing without s/s infriltration. Pt drowsy but awake, answers questions appropriately. No c/o pain at  this time. VSS. O2 @ 2lpm [place] on. Foley cath intact draining clear yellow urine. Foam dressing intact to right hip surgical incision with no drainage noted, ice pack intact to site. Wedge foam in place. Pt with good sensation and movement of right lower leg/foot/toes. Pedal pulses + and strong bilaterally.

## 2021-08-06 LAB — BASIC METABOLIC PANEL
Anion gap: 7 (ref 5–15)
BUN: 13 mg/dL (ref 8–23)
CO2: 26 mmol/L (ref 22–32)
Calcium: 7.7 mg/dL — ABNORMAL LOW (ref 8.9–10.3)
Chloride: 100 mmol/L (ref 98–111)
Creatinine, Ser: 0.6 mg/dL (ref 0.44–1.00)
GFR, Estimated: >60 mL/min (ref >60)
Glucose, Bld: 137 mg/dL — ABNORMAL HIGH (ref 70–99)
Potassium: 4.5 mmol/L (ref 3.5–5.1)
Sodium: 133 mmol/L — ABNORMAL LOW (ref 135–145)

## 2021-08-06 LAB — CBC
HCT: 30.1 % — ABNORMAL LOW (ref 36.0–46.0)
Hemoglobin: 9.6 g/dL — ABNORMAL LOW (ref 12.0–15.0)
MCH: 30.1 pg (ref 26.0–34.0)
MCHC: 31.9 g/dL (ref 30.0–36.0)
MCV: 94.4 fL (ref 80.0–100.0)
Platelets: 172 10*3/uL (ref 150–400)
RBC: 3.19 MIL/uL — ABNORMAL LOW (ref 3.87–5.11)
RDW: 13.9 % (ref 11.5–15.5)
WBC: 9.9 10*3/uL (ref 4.0–10.5)
nRBC: 0 % (ref 0.0–0.2)

## 2021-08-06 LAB — GLUCOSE, CAPILLARY
Glucose-Capillary: 123 mg/dL — ABNORMAL HIGH (ref 70–99)
Glucose-Capillary: 183 mg/dL — ABNORMAL HIGH (ref 70–99)
Glucose-Capillary: 181 mg/dL — ABNORMAL HIGH (ref 70–99)
Glucose-Capillary: 142 mg/dL — ABNORMAL HIGH (ref 70–99)

## 2021-08-06 LAB — MAGNESIUM: Magnesium: 2.1 mg/dL (ref 1.7–2.4)

## 2021-08-06 NOTE — Plan of Care (Signed)
  Problem: Education: Goal: Knowledge of General Education information will improve Description: Including pain rating scale, medication(s)/side effects and non-pharmacologic comfort measures Outcome: Progressing   Problem: Clinical Measurements: Goal: Ability to maintain clinical measurements within normal limits will improve Outcome: Progressing   

## 2021-08-06 NOTE — Plan of Care (Signed)
°  Problem: Acute Rehab PT Goals(only PT should resolve) Goal: Pt Will Go Supine/Side To Sit Outcome: Progressing Flowsheets (Taken 08/06/2021 1206) Pt will go Supine/Side to Sit: with moderate assist Goal: Patient Will Transfer Sit To/From Stand Outcome: Progressing Flowsheets (Taken 08/06/2021 1206) Patient will transfer sit to/from stand: with minimal assist Goal: Pt Will Transfer Bed To Chair/Chair To Bed Outcome: Progressing Flowsheets (Taken 08/06/2021 1206) Pt will Transfer Bed to Chair/Chair to Bed: with mod assist Goal: Pt Will Ambulate Outcome: Progressing Flowsheets (Taken 08/06/2021 1206) Pt will Ambulate:  15 feet  with moderate assist  with rolling walker   12:07 PM, 08/06/21 Lonell Grandchild, MPT Physical Therapist with [facility] [phone] office [phone] mobile phone

## 2021-08-06 NOTE — TOC Progression Note (Signed)
Transition of Care [facility]) - Progression Note    Patient Details  Name: Yolanda Franco MRN: [id number] Date of Birth: 10/13/1932  [facility] CM/SW Contact  Salome Arnt, [place] Phone Number: 08/06/2021, 10:34 AM  Clinical Narrative: PT recommending SNF. CMA starting SNF auth with [employer]. Anticipate d/c tomorrow.       Expected Discharge Plan: [place] Barriers to Discharge: Continued Medical Work up  Expected Discharge Plan and Services Expected Discharge Plan: [place] In-house Referral: Clinical Social Work   Post Acute Care Choice: [place] Living arrangements for the past 2 months: Single Family Home                                       Social Determinants of Health (SDOH) Interventions    Readmission Risk Interventions No flowsheet data found.

## 2021-08-06 NOTE — Progress Notes (Signed)
° °  ORTHOPAEDIC PROGRESS NOTE  Status post Right Hip hemiarthroplasty  DOS: 08/05/21  SUBJECTIVE: Patient is confused this morning.  Her son is at the bedside.  She states she wants to get out of bed, and work with therapy.  She continues to have pain in the right hip.   OBJECTIVE: PE:  Alert and oriented, no acute distress.  Some confusion this morning.  Hip abduction pillow is in place. Surgical dressing is clean, dry and intact.  Active motion intact in the EHL/TA. Toes are warm and well-perfused.  2+ DP pulse.  Vitals:   08/05/21 2036 08/06/21 0439  BP: (!) 175/87 (!) 151/63  Pulse: 82 76  Resp: 19 19  Temp: 99.1 F (37.3 C) 98.2 F (36.8 C)  SpO2: 97% 95%   CBC Latest Ref Rng & Units 08/06/2021 08/04/2021 08/03/2021  WBC 4.0 - 10.5 K/uL 9.9 8.6 9.5  Hemoglobin 12.0 - 15.0 g/dL 9.6(L) 11.7(L) 12.3  Hematocrit 36.0 - 46.0 % 30.1(L) 38.1 38.4  Platelets 150 - 400 K/uL 172 199 211     ASSESSMENT: MADELLYN DENIO is a 88 y.o. female doing well.  Stable following surgery.  Is having some confusion this morning.  She has yet to work with PT.   PLAN: Weightbearing: WBAT RLE Hip abduction pillow in place at all times while in bed.  Okay to remove for therapy. Insicional and dressing care: Reinforce dressings as needed Orthopedic device(s): None VTE prophylaxis: Okay to resume Eliquis POD#1 Pain control: PO pain medications as needed; judicious use of narcotics Follow - up plan: 2 weeks.  If the patient discharges to a facility, the sutures can be trimmed and dressing changed around 2 weeks after surgery.  If the facility have any questions or concerns, we can see her in clinic in 2 weeks.  Otherwise, she can return to clinic for repeat evaluation at 6 weeks following surgery.   Contact information:     Terral Cooks A. Amedeo Kinsman, MD [facility] [address] [place],  [place]  [address] Phone: [phone] Fax: [phone]

## 2021-08-06 NOTE — Plan of Care (Signed)
° °  Problem: Acute Rehab OT Goals (only OT should resolve) Goal: Pt. Will Perform Grooming Flowsheets (Taken 08/06/2021 1009) Pt Will Perform Grooming:  standing  with mod assist Goal: Pt. Will Perform Upper Body Dressing Flowsheets (Taken 08/06/2021 1009) Pt Will Perform Upper Body Dressing:  with set-up  sitting Goal: Pt. Will Perform Lower Body Dressing Flowsheets (Taken 08/06/2021 1009) Pt Will Perform Lower Body Dressing:  with min assist  sitting/lateral leans  with adaptive equipment Goal: Pt. Will Transfer To Toilet Flowsheets (Taken 08/06/2021 1009) Pt Will Transfer to Toilet:  with min assist  stand pivot transfer  bedside commode Goal: Pt. Will Perform Toileting-Clothing Manipulation Flowsheets (Taken 08/06/2021 1009) Pt Will Perform Toileting - Clothing Manipulation and hygiene:  with min assist  sitting/lateral leans Goal: Pt/Caregiver Will Perform Home Exercise Program Flowsheets (Taken 08/06/2021 1009) Pt/caregiver will Perform Home Exercise Program:  Increased ROM  Increased strength  Right Upper extremity  Left upper extremity  Independently  Shaunda Tipping OT, MOT

## 2021-08-06 NOTE — Evaluation (Signed)
Occupational Therapy Evaluation Patient Details Name: Yolanda Franco MRN: [id number] DOB: 05-Apr-1933 Today's Date: 08/06/2021   History of Present Illness Yolanda Franco is a  Female with a history of hypertension, atrial fibrillation on apixaban, interstitial cystitis, anxiety/depression, diabetes mellitus type 2, and hypothyroidism presenting with mechanical fall.  The patient was getting up from her commode transfer back to her bed when she slipped and fell onto her right side and buttocks in the early morning of 08/03/2021.  There was no syncope.  The patient has not had any frequent falls, but does use a walker to assist with ambulation.  She denies any recent fevers, chills, headache, neck pain, chest pain, shortness of breath, coughing, hemoptysis, nausea, vomiting, diarrhea, abdominal pain, dysuria, hematuria.  The patient did recently have a colonoscopy performed on 07/30/2021 which showed numerous polyps.  She has a follow-up appointment with her gastroenterologist on 08/06/2021.  She was not started on any new medications after her colonoscopy.  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  There is no hematochezia or melena.  She denies any previous history of stroke or coronary disease or MI.   Clinical Impression   Pt agreeable to OT and PT co-evaluation. Pt removed from supplemental O2 and able to stay at 89% or above for SpO2. Pt demonstrates need for total assist for lower body dressing at this time with slow labored movement for bed mobility. Pt reports assist only for bathing at baseline. Pt required mod to max A for bed mobility and mod to max A for stand pivot transfer due to weakness, pain, and difficulty mobilizing B LE. Pt has R UE deficits at baseline with limited shoulder A/ROM against gravity. Pt left in chair with call  bell within reach and family present. Pt will benefit from continued OT in the hospital and recommended venue below to increase strength, balance, and endurance for safe ADL's.        Recommendations for follow up therapy are one component of a multi-disciplinary discharge planning process, led by the attending physician.  Recommendations may be updated based on patient status, additional functional criteria and insurance authorization.   Follow Up Recommendations  Skilled nursing-short term rehab (<3 hours/day)    Assistance Recommended at Discharge Intermittent Supervision/Assistance  Patient can return home with the following A lot of help with walking and/or transfers;A lot of help with bathing/dressing/bathroom;Assistance with cooking/housework;Assist for transportation;Help with stairs or ramp for entrance    Functional Status Assessment  Patient has had a recent decline in their functional status and demonstrates the ability to make significant improvements in function in a reasonable and predictable amount of time.  Equipment Recommendations  None recommended by OT    Recommendations for Other Services       Precautions / Restrictions Precautions Precautions: Posterior Hip;Fall Precaution Booklet Issued: No Precaution Comments: abduction pillow Restrictions Weight Bearing Restrictions: Yes RLE Weight Bearing: Weight bearing as tolerated      Mobility Bed Mobility Overal bed mobility: Needs Assistance Bed Mobility: Supine to Sit     Supine to sit: Mod assist, Max assist, HOB elevated     General bed mobility comments: Very slow labored movement with verbal and tactile cuing to maintain precautions.    Transfers Overall transfer level: Needs assistance Equipment used: Rolling walker (2 wheels) Transfers: Sit  to/from Stand, Bed to chair/wheelchair/BSC Sit to Stand: Mod assist, Max assist Stand pivot transfers: Mod assist, Max assist         General transfer  comment: Pt using RW with difficulty lifting L LE for transfer due to pre-existing nerve issues.      Balance Overall balance assessment: Needs assistance Sitting-balance support: No upper extremity supported, Feet supported Sitting balance-Leahy Scale: Fair Sitting balance - Comments: fair seated EOB   Standing balance support: Bilateral upper extremity supported, During functional activity, Reliant on assistive device for balance Standing balance-Leahy Scale: Poor Standing balance comment: using RW                           ADL either performed or assessed with clinical judgement   ADL Overall ADL's : Needs assistance/impaired Eating/Feeding: Modified independent;Sitting   Grooming: Min guard;Minimal assistance;Sitting           Upper Body Dressing : Min guard;Sitting;Minimal assistance   Lower Body Dressing: Total assistance;Bed level Lower Body Dressing Details (indicate cue type and reason): To don socks in bed. Toilet Transfer: Moderate assistance;Maximal assistance;Rolling walker (2 wheels) Toilet Transfer Details (indicate cue type and reason): Simulated via EOB to chair transfer. Toileting- Clothing Manipulation and Hygiene: Total assistance;Maximal assistance;Sitting/lateral lean Toileting - Clothing Manipulation Details (indicate cue type and reason): Pt using purewick for urination at this time.     Functional mobility during ADLs: Maximal assistance;Rolling walker (2 wheels)       Vision Baseline Vision/History: 1 Wears glasses Ability to See in Adequate Light: 1 Impaired Patient Visual Report: No change from baseline Vision Assessment?: No apparent visual deficits                Pertinent Vitals/Pain Pain Assessment Pain Assessment: 0-10 Pain Score: 7  Pain Location: R hip Pain Descriptors / Indicators: Throbbing Pain Intervention(s): Limited activity within patient's tolerance, Monitored during session, Premedicated before session,  Repositioned     Hand Dominance Right   Extremity/Trunk Assessment Upper Extremity Assessment Upper Extremity Assessment: RUE deficits/detail;LUE deficits/detail RUE Deficits / Details: 2+/5 MMT for shoulder flexion seated in chair. Pt reports this is a baseline issues from shoulder tear. WFL fine motor skills. RUE Coordination: WNL LUE Deficits / Details: Generally weak but able to achieve [facility] A/ROM for shoulder flexion and use UE functionally during transfer. LUE Coordination: WNL   Lower Extremity Assessment Lower Extremity Assessment: Defer to PT evaluation   Cervical / Trunk Assessment Cervical / Trunk Assessment: Normal   Communication Communication Communication: No difficulties   Cognition Arousal/Alertness: Awake/alert Behavior During Therapy: WFL for tasks assessed/performed Overall Cognitive Status: Within Functional Limits for tasks assessed                                                        Home Living Family/patient expects to be discharged to:: Private residence Living Arrangements: Other relatives Available Help at Discharge: Family;Available PRN/intermittently Type of Home: House Home Access: Stairs to enter [employer] of Steps: 2 Entrance Stairs-Rails: None Home Layout: One level     Bathroom Shower/Tub: Teacher, early years/pre: Standard Bathroom Accessibility: Yes How Accessible: Accessible via walker Home Equipment: Rolling Walker (2 wheels);Rollator (4 wheels);Other (comment);BSC/3in1;Grab bars - toilet;Grab bars - tub/shower;Shower seat (3 point cane)  Prior Functioning/Environment Prior Level of Function : Needs assist       Physical Assist : Mobility (physical);ADLs (physical) Mobility (physical): Transfers;Gait;Stairs ADLs (physical): Bathing;IADLs Mobility Comments: Pt reportedly can transfer within home using RW without assist. Assist needed in community using stairs with pt  reporting rollator use when out of house. Pt reportedly drives. ADLs Comments: Pt's niece assists with bathing. Pt reported independence with all other ADL's. Niece assists with IADL's.        OT Problem List: Decreased strength;Decreased range of motion;Decreased activity tolerance;Impaired balance (sitting and/or standing);Pain      OT Treatment/Interventions: Self-care/ADL training;Therapeutic exercise;Therapeutic activities;Balance training;Patient/family education;DME and/or AE instruction    OT Goals(Current goals can be found in the care plan section) Acute Rehab OT Goals Patient Stated Goal: Plan to attend rehab. OT Goal Formulation: With patient/family Time For Goal Achievement: 08/20/21 Potential to Achieve Goals: Fair  OT Frequency: Min 2X/week    Co-evaluation PT/OT/SLP Co-Evaluation/Treatment: Yes Reason for Co-Treatment: Complexity of the patient's impairments (multi-system involvement)   OT goals addressed during session: ADL's and self-care                       End of Session Equipment Utilized During Treatment: Rolling walker (2 wheels)  Activity Tolerance: Patient tolerated treatment well Patient left: in chair;with call bell/phone within reach;with family/visitor present  OT Visit Diagnosis: Unsteadiness on feet (R26.81);Other abnormalities of gait and mobility (R26.89);History of falling (Z91.81);Muscle weakness (generalized) (M62.81);Pain Pain - Right/Left: Right Pain - part of body: Hip                Time: 7989-2119 OT Time Calculation (min): 28 min Charges:  OT General Charges $OT Visit: 1 Visit OT Evaluation $OT Eval Moderate Complexity: 1 Mod  Ellakate Gonsalves OT, MOT  [employer] 08/06/2021, 10:06 AM

## 2021-08-06 NOTE — Evaluation (Signed)
Physical Therapy Evaluation Patient Details Name: Yolanda Franco MRN: [id number] DOB: 06-19-1932 Today's Date: 08/06/2021  History of Present Illness  Yolanda Franco is a  Female s/p Right Hemiarthroplasty on 08/05/21 with a history of hypertension, atrial fibrillation on apixaban, interstitial cystitis, anxiety/depression, diabetes mellitus type 2, and hypothyroidism presenting with mechanical fall.  The patient was getting up from her commode transfer back to her bed when she slipped and fell onto her right side and buttocks in the early morning of 08/03/2021.  There was no syncope.  The patient has not had any frequent falls, but does use a walker to assist with ambulation.  She denies any recent fevers, chills, headache, neck pain, chest pain, shortness of breath, coughing, hemoptysis, nausea, vomiting, diarrhea, abdominal pain, dysuria, hematuria.  The patient did recently have a colonoscopy performed on 07/30/2021 which showed numerous polyps.  She has a follow-up appointment with her gastroenterologist on 08/06/2021.  She was not started on any new medications after her colonoscopy.  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  There is no hematochezia or melena.  She denies any previous history of stroke or coronary disease or MI.   Clinical Impression  Patient demonstrates slow labored movement for sitting up at bedside with fair/good carryover keeping right hip in neutral position and fair carryover for propping up on elbows to hads requiring repeated verbal/tactile cueing.  Patient very unsteady on feet and required Max tactile assistance to shuffle left foot when taking side steps due to baseline weakness and inability to compensate due to increasing right hip pain when weightbearing.  Patient on room during activities with SpO2 at 94%  and tolerated sitting up in chair after therapy with her son present in room - RN notified.  Patient will benefit from continued skilled physical therapy in hospital and recommended venue below to increase strength, balance, endurance for safe ADLs and gait.          Recommendations for follow up therapy are one component of a multi-disciplinary discharge planning process, led by the attending physician.  Recommendations may be updated based on patient status, additional functional criteria and insurance authorization.  Follow Up Recommendations Skilled nursing-short term rehab (<3 hours/day)    Assistance Recommended at Discharge Intermittent Supervision/Assistance  Patient can return home with the following  A lot of help with walking and/or transfers;A lot of help with bathing/dressing/bathroom;Help with stairs or ramp for entrance;Assistance with cooking/housework    Equipment Recommendations None recommended by PT  Recommendations for Other Services       Functional Status Assessment Patient has had a recent decline in their functional status and demonstrates the ability to make significant improvements in function in a reasonable and predictable amount of time.     Precautions / Restrictions Precautions Precautions: Fall;Posterior Hip Precaution Booklet Issued: No Precaution Comments: abduction pillow Restrictions Weight Bearing Restrictions: Yes RLE Weight Bearing: Weight bearing as tolerated      Mobility  Bed Mobility Overal bed mobility: Needs Assistance Bed Mobility: Supine to Sit     Supine to sit: Mod assist, Max assist, HOB elevated     General bed mobility comments: slow labored movement with fair carryover for propping up on elbows to hands with frequent verbal/tactile cueing    Transfers Overall transfer level: Needs assistance Equipment used: Rolling walker (  2 wheels) Transfers: Sit to/from Stand, Bed to chair/wheelchair/BSC Sit to Stand: Mod assist,  Max assist Stand pivot transfers: Mod assist, Max assist         General transfer comment: difficulty advancing RLE due to increased pain, and LLE due to baseline weakness    Ambulation/Gait Ambulation/Gait assistance: Max assist Gait Distance (Feet): 4 Feet Assistive device: Rolling walker (2 wheels) Gait Pattern/deviations: Decreased step length - right, Decreased step length - left, Decreased stance time - right, Decreased stance time - left, Decreased stride length, Antalgic, Shuffle Gait velocity: slow     General Gait Details: limited to a few slow labored unsteady side steps with mostly shuffling of LLE due to weakness, poor tolerance for weightbearing on RLE due to increased hip pain  Stairs            Wheelchair Mobility    Modified Rankin (Stroke Patients Only)       Balance Overall balance assessment: Needs assistance Sitting-balance support: Feet supported, No upper extremity supported Sitting balance-Leahy Scale: Fair Sitting balance - Comments: fair seated EOB   Standing balance support: Bilateral upper extremity supported, During functional activity, Reliant on assistive device for balance Standing balance-Leahy Scale: Poor Standing balance comment: using RW                             Pertinent Vitals/Pain Pain Assessment Pain Assessment: 0-10 Pain Score: 7  Pain Location: R hip Pain Descriptors / Indicators: Throbbing, Sore Pain Intervention(s): Limited activity within patient's tolerance, Monitored during session, Premedicated before session, Repositioned    Home Living Family/patient expects to be discharged to:: Private residence Living Arrangements: Other relatives Available Help at Discharge: Family;Available PRN/intermittently Type of Home: House Home Access: Stairs to enter Entrance Stairs-Rails: None Entrance Stairs-Number of Steps: 2   Home Layout: One level Home Equipment: Conservation officer, nature (2 wheels);Rollator (4  wheels);Other (comment);BSC/3in1;Grab bars - toilet;Grab bars - tub/shower;Shower seat      Prior Function Prior Level of Function : Needs assist       Physical Assist : Mobility (physical);ADLs (physical) Mobility (physical): Transfers;Gait;Stairs ADLs (physical): Bathing;IADLs Mobility Comments: Pt reportedly can transfer within home using RW without assist. Assist needed in community using stairs with pt reporting rollator use when out of house. Pt reportedly drives. ADLs Comments: Pt's niece assists with bathing. Pt reported independence with all other ADL's. Niece assists with IADL's.     Hand Dominance   Dominant Hand: Right    Extremity/Trunk Assessment   Upper Extremity Assessment Upper Extremity Assessment: Defer to OT evaluation RUE Deficits / Details: 2+/5 MMT for shoulder flexion seated in chair. Pt reports this is a baseline issues from shoulder tear. WFL fine motor skills. RUE Coordination: WNL LUE Deficits / Details: Generally weak but able to achieve [facility] A/ROM for shoulder flexion and use UE functionally during transfer. LUE Coordination: WNL    Lower Extremity Assessment Lower Extremity Assessment: Generalized weakness;RLE deficits/detail;LLE deficits/detail RLE Deficits / Details: grossly 3+/5 RLE: Unable to fully assess due to pain RLE Sensation: WNL RLE Coordination: WNL LLE Deficits / Details: grossly -4/5 except ankle doriflexion -3/5 (baseline due to old back surgery per patient) LLE Sensation: decreased proprioception LLE Coordination: decreased fine motor    Cervical / Trunk Assessment Cervical / Trunk Assessment: Normal  Communication   Communication: No difficulties  Cognition Arousal/Alertness: Awake/alert Behavior During Therapy: WFL for tasks assessed/performed Overall Cognitive Status: Within Functional Limits for tasks assessed  General Comments      Exercises      Assessment/Plan    PT Assessment Patient needs continued PT services  PT Problem List Decreased strength;Decreased activity tolerance;Decreased balance;Decreased mobility       PT Treatment Interventions DME instruction;Gait training;Stair training;Functional mobility training;Therapeutic activities;Balance training;Patient/family education    PT Goals (Current goals can be found in the Care Plan section)  Acute Rehab PT Goals Patient Stated Goal: return home after rehab PT Goal Formulation: With patient/family Time For Goal Achievement: 08/20/21 Potential to Achieve Goals: Good    Frequency Min 4X/week     Co-evaluation PT/OT/SLP Co-Evaluation/Treatment: Yes Reason for Co-Treatment: Complexity of the patient's impairments (multi-system involvement);To address functional/ADL transfers PT goals addressed during session: Mobility/safety with mobility;Balance;Proper use of DME OT goals addressed during session: ADL's and self-care       AM-PAC PT "6 Clicks" Mobility  Outcome Measure Help needed turning from your back to your side while in a flat bed without using bedrails?: A Lot Help needed moving from lying on your back to sitting on the side of a flat bed without using bedrails?: A Lot Help needed moving to and from a bed to a chair (including a wheelchair)?: A Lot Help needed standing up from a chair using your arms (e.g., wheelchair or bedside chair)?: A Lot Help needed to walk in hospital room?: A Lot Help needed climbing 3-5 steps with a railing? : Total 6 Click Score: 11    End of Session   Activity Tolerance: Patient tolerated treatment well;Patient limited by fatigue;Patient limited by pain Patient left: in chair;with call bell/phone within reach;with chair alarm set;with family/visitor present Nurse Communication: Mobility status PT Visit Diagnosis: Unsteadiness on feet (R26.81);Other abnormalities of gait and mobility (R26.89);Muscle weakness (generalized)  (M62.81)    Time: 8182-9937 PT Time Calculation (min) (ACUTE ONLY): 27 min   Charges:   PT Evaluation $PT Eval Moderate Complexity: 1 Mod PT Treatments $Therapeutic Activity: 23-37 mins        12:04 PM, 08/06/21 Lonell Grandchild, MPT Physical Therapist with [facility] 336 [phone] office [phone] mobile phone

## 2021-08-06 NOTE — Progress Notes (Signed)
PROGRESS NOTE  Yolanda Franco GLO:[id number] DOB: 1933/06/09 DOA: 08/03/2021 PCP: [name], MD  Brief History:  Female with a history of hypertension, atrial fibrillation on apixaban, interstitial cystitis, anxiety/depression, diabetes mellitus type 2, and hypothyroidism presenting with mechanical fall.  The patient was getting up from her commode transfer back to her bed when she slipped and fell onto her right side and buttocks in the early morning of 08/03/2021.  There was no syncope.  The patient has not had any frequent falls, but does use a walker to assist with ambulation.  She denies any recent fevers, chills, headache, neck pain, chest pain, shortness of breath, coughing, hemoptysis, nausea, vomiting, diarrhea, abdominal pain, dysuria, hematuria.  The patient did recently have a colonoscopy performed on 07/30/2021 which showed numerous polyps.  She has a follow-up appointment with her gastroenterologist on 08/06/2021.  She was not started on any new medications after her colonoscopy.  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  There is no hematochezia or melena.  She denies any previous history of stroke or coronary disease or MI. In the ED, the patient was afebrile hemodynamically stable with oxygen saturation 92% on room air. BMP showed sodium 142, potassium 3.6, serum creatinine 0.46.  LFTs were unremarkable.  WBC 9.5, hemoglobin 12.3, platelets 211,000.  EKG shows sinus rhythm and nonspecific T wave changes.  X-ray of the pelvis that showed a displaced right femoral neck fracture.  Orthopedics, Dr. Amedeo Kinsman was consulted to assist.  She underwent right hemiarthroplasty on 08/05/21.  PT recommended SNF with which patient agreed.    Assessment and Plan: * Closed displaced fracture of right femoral neck ([place])- (present on  admission) Ortho consulted--Dr. Amedeo Kinsman Operative repair planned on 08/05/21 Judicious opioids 2/27--Right hemiarthroplasty PT>>SNF  Paroxysmal atrial fibrillation (HCC) Currently in sinus Personally reviewed EKG--sinus, nonspecific [employer] apixaban for surgery Resume apixaban post-op on 08/07/21  Essential hypertension Continue amlodpine  Hypothyroidism Continue levothyroxine  Depression Continue fluoxetine, trazodone and mirtazipine      Status is: Inpatient Remains inpatient appropriate because: severity of illness requiring operative intervention for femoral neck fracture                       Family Communication:   son updated at bedside 2/28   Consultants:  ortho--[place]   Code Status:  FULL    DVT Prophylaxis:  apixaban on hold      Procedures: As Listed in Progress Note Above   Antibiotics: None             Subjective: Patient denies fevers, chills, headache, chest pain, dyspnea, nausea, vomiting, diarrhea, abdominal pain, dysuria, hematuria, hematochezia, and melena.   Objective: Vitals:   08/05/21 1330 08/05/21 2036 08/06/21 0439 08/06/21 1343  BP: (!) 160/58 (!) 175/87 (!) 151/63 (!) 151/76  Pulse: 85 82 76 98  Resp: 16 19 19 17   Temp:  99.1 F (37.3 C) 98.2 F (36.8 C) 98.1 F (36.7 C)  TempSrc:  Oral Oral Oral  SpO2: 96% 97% 95% 90%  Weight:      Height:        Intake/Output Summary (Last 24 hours) at 08/06/2021 1555 Last data filed at 08/06/2021 1500 Gross per 24 hour  Intake 1120.06 ml  Output 1900  ml  Net -779.94 ml   Weight change:  Exam:  General:  Pt is alert, follows commands appropriately, not in acute distress HEENT: No icterus, No thrush, No neck mass, [place]/AT Cardiovascular: RRR, S1/S2, no rubs, no gallops Respiratory: fine bibasilar crackles. No wheeze Abdomen: Soft/+BS, non tender, non distended, no guarding Extremities: trace LE edema, No lymphangitis, No petechiae, No rashes, no  synovitis   Data Reviewed: I have personally reviewed following labs and imaging studies Basic Metabolic Panel: Recent Labs  Lab 08/03/21 0926 08/03/21 0940 08/04/21 0536 08/06/21 0529  NA 142  --  136 133*  K 3.6  --  4.0 4.5  CL 109  --  103 100  CO2 27  --  26 26  GLUCOSE 95  --  148* 137*  BUN 9  --  17 13  CREATININE 0.46  --  0.73 0.60  CALCIUM 8.6*  --  8.6* 7.7*  MG  --  2.0 2.0 2.1   Liver Function Tests: Recent Labs  Lab 08/03/21 0926  AST 19  ALT 18  ALKPHOS 49  BILITOT 0.3  PROT 6.3*  ALBUMIN 3.4*   No results for input(s): LIPASE, AMYLASE in the last 168 hours. No results for input(s): AMMONIA in the last 168 hours. Coagulation Profile: No results for input(s): INR, PROTIME in the last 168 hours. CBC: Recent Labs  Lab 08/03/21 0926 08/04/21 0536 08/06/21 0529  WBC 9.5 8.6 9.9  NEUTROABS 8.2*  --   --   HGB 12.3 11.7* 9.6*  HCT 38.4 38.1 30.1*  MCV 93.0 93.4 94.4  PLT 211 199 172   Cardiac Enzymes: No results for input(s): CKTOTAL, CKMB, CKMBINDEX, TROPONINI in the last 168 hours. BNP: Invalid input(s): POCBNP CBG: Recent Labs  Lab 08/05/21 1236 08/05/21 1629 08/05/21 2102 08/06/21 0734 08/06/21 1116  GLUCAP 141* 138* 165* 123* 183*   HbA1C: Recent Labs    08/04/21 0536  HGBA1C 5.2   Urine analysis:    Component Value Date/Time   COLORURINE YELLOW 07/06/2021 0153   APPEARANCEUR CLEAR 07/06/2021 0153   LABSPEC 1.010 07/06/2021 0153   PHURINE 7.0 07/06/2021 0153   GLUCOSEU NEGATIVE 07/06/2021 0153   HGBUR NEGATIVE 07/06/2021 0153   BILIRUBINUR NEGATIVE 07/06/2021 0153   KETONESUR NEGATIVE 07/06/2021 0153   PROTEINUR NEGATIVE 07/06/2021 0153   NITRITE NEGATIVE 07/06/2021 0153   LEUKOCYTESUR TRACE (A) 07/06/2021 0153   Sepsis Labs: @LABRCNTIP (procalcitonin:4,lacticidven:4) ) Recent Results (from the past 240 hour(s))  Resp Panel by RT-PCR (Flu A&B, Covid) Nasopharyngeal Swab     Status: None   Collection Time: 08/03/21   8:23 AM   Specimen: Nasopharyngeal Swab; Nasopharyngeal(NP) swabs in vial transport medium  Result Value Ref Range Status   SARS Coronavirus 2 by RT PCR NEGATIVE NEGATIVE Final    Comment: (NOTE) SARS-CoV-2 target nucleic acids are NOT DETECTED.  The SARS-CoV-2 RNA is generally detectable in upper respiratory specimens during the acute phase of infection. The lowest concentration of SARS-CoV-2 viral copies this assay can detect is 138 copies/mL. A negative result does not preclude SARS-Cov-2 infection and should not be used as the sole basis for treatment or other patient management decisions. A negative result may occur with  improper specimen collection/handling, submission of specimen other than nasopharyngeal swab, presence of viral mutation(s) within the areas targeted by this assay, and inadequate number of viral copies(<138 copies/mL). A negative result must be combined with clinical observations, patient history, and epidemiological information. The expected result is Negative.  Fact Sheet  for Patients:  EntrepreneurPulse.com.au  Fact Sheet for Healthcare Providers:  IncredibleEmployment.be  This test is no t yet approved or cleared by the [place] FDA and  has been authorized for detection and/or diagnosis of SARS-CoV-2 by FDA under an Emergency Use Authorization (EUA). This EUA will remain  in effect (meaning this test can be used) for the duration of the COVID-19 declaration under Section 564(b)(1) of the Act, 21 U.S.C.section 360bbb-3(b)(1), unless the authorization is terminated  or revoked sooner.       Influenza A by PCR NEGATIVE NEGATIVE Final   Influenza B by PCR NEGATIVE NEGATIVE Final    Comment: (NOTE) The Xpert Xpress SARS-CoV-2/FLU/RSV plus assay is intended as an aid in the diagnosis of influenza from Nasopharyngeal swab specimens and should not be used as a sole basis for treatment. Nasal washings and aspirates  are unacceptable for Xpert Xpress SARS-CoV-2/FLU/RSV testing.  Fact Sheet for Patients: EntrepreneurPulse.com.au  Fact Sheet for Healthcare Providers: IncredibleEmployment.be  This test is not yet approved or cleared by the [place] FDA and has been authorized for detection and/or diagnosis of SARS-CoV-2 by FDA under an Emergency Use Authorization (EUA). This EUA will remain in effect (meaning this test can be used) for the duration of the COVID-19 declaration under Section 564(b)(1) of the Act, 21 U.S.C. section 360bbb-3(b)(1), unless the authorization is terminated or revoked.  Performed at [facility], [address] [place], [place] [address]   Surgical PCR screen     Status: None   Collection Time: 08/04/21 11:08 AM   Specimen: Nasal Mucosa; Nasal Swab  Result Value Ref Range Status   MRSA, PCR NEGATIVE NEGATIVE Final   Staphylococcus aureus NEGATIVE NEGATIVE Final    Comment: (NOTE) The Xpert SA Assay (FDA approved for NASAL specimens in patients 77 years of age and older), is one component of a comprehensive surveillance program. It is not intended to diagnose infection nor to guide or monitor treatment. Performed at [facility], [address] [place], [place] [address]      Scheduled Meds:  amLODipine  5 mg Oral Daily   Chlorhexidine Gluconate Cloth  6 each Topical Daily   enoxaparin (LOVENOX) injection  40 mg Subcutaneous Daily   feeding supplement  237 mL Oral BID BM   FLUoxetine  40 mg Oral Daily    HYDROmorphone (DILAUDID) injection  0.5 mg Intravenous Once   insulin aspart  0-5 Units Subcutaneous QHS   insulin aspart  0-9 Units Subcutaneous TID WC   levothyroxine  25 mcg Oral QAC breakfast   mirtazapine  15 mg Oral QHS   multivitamin with minerals  1 tablet Oral Daily   polyethylene glycol  17 g Oral Daily   traZODone  150 mg Oral QHS   Continuous Infusions:  Procedures/Studies: Chest Portable 1 View  Result  Date: 08/03/2021 CLINICAL DATA:  Preop evaluation for hip surgery EXAM: PORTABLE CHEST 1 VIEW COMPARISON:  CT done on 11/03/2013 FINDINGS: Cardiac size is within normal limits. Thoracic aorta is tortuous there are no signs of pulmonary edema or focal pulmonary consolidation. Left hemidiaphragm is elevated. There is no pleural effusion or pneumothorax. IMPRESSION: There are no signs of pulmonary edema or focal pulmonary consolidation. Electronically Signed   By: Elmer Picker M.D.   On: 08/03/2021 12:19   DG HIP UNILAT WITH PELVIS 2-3 VIEWS RIGHT  Result Date: 08/05/2021 CLINICAL DATA:  Status post right hip replacement. EXAM: DG HIP (WITH OR WITHOUT PELVIS) 2-3V RIGHT COMPARISON:  August 03, 2021. FINDINGS: Right femoral and acetabular components are well situated. Expected postoperative changes are noted in the surrounding soft tissues. IMPRESSION: Status post right total hip arthroplasty. Electronically Signed   By: Marijo Conception M.D.   On: 08/05/2021 12:28   DG Hip Unilat  With Pelvis 2-3 Views Right  Result Date: 08/03/2021 CLINICAL DATA:  Fall with right hip pain EXAM: DG HIP (WITH OR WITHOUT PELVIS) 2-3V RIGHT COMPARISON:  07/07/2021 abdominal CT FINDINGS: Right femoral neck fracture with displacement and varus deformity. No evidence of pelvic ring fracture or diastasis. Pelvis is rotated to the right. Generalized osteopenia. IMPRESSION: Displaced right femoral neck fracture. Electronically Signed   By: Jorje Guild M.D.   On: 08/03/2021 08:49    Orson Eva, DO  [facility]  If 7PM-7AM, please contact night-coverage [email or web address] Password [facility] 08/06/2021, 3:55 PM   LOS: 3 days

## 2021-08-07 DIAGNOSIS — R262 Difficulty in walking, not elsewhere classified: Secondary | ICD-10-CM | POA: Diagnosis not present

## 2021-08-07 DIAGNOSIS — N3000 Acute cystitis without hematuria: Secondary | ICD-10-CM | POA: Diagnosis not present

## 2021-08-07 DIAGNOSIS — R10819 Abdominal tenderness, unspecified site: Secondary | ICD-10-CM | POA: Diagnosis not present

## 2021-08-07 DIAGNOSIS — R1030 Lower abdominal pain, unspecified: Secondary | ICD-10-CM | POA: Diagnosis not present

## 2021-08-07 DIAGNOSIS — Z9981 Dependence on supplemental oxygen: Secondary | ICD-10-CM | POA: Diagnosis not present

## 2021-08-07 DIAGNOSIS — Z7901 Long term (current) use of anticoagulants: Secondary | ICD-10-CM | POA: Diagnosis not present

## 2021-08-07 DIAGNOSIS — K5900 Constipation, unspecified: Secondary | ICD-10-CM | POA: Diagnosis not present

## 2021-08-07 DIAGNOSIS — W19XXXD Unspecified fall, subsequent encounter: Secondary | ICD-10-CM | POA: Diagnosis not present

## 2021-08-07 DIAGNOSIS — D649 Anemia, unspecified: Secondary | ICD-10-CM | POA: Diagnosis not present

## 2021-08-07 DIAGNOSIS — M6281 Muscle weakness (generalized): Secondary | ICD-10-CM | POA: Diagnosis not present

## 2021-08-07 DIAGNOSIS — J9601 Acute respiratory failure with hypoxia: Secondary | ICD-10-CM | POA: Diagnosis not present

## 2021-08-07 DIAGNOSIS — M25551 Pain in right hip: Secondary | ICD-10-CM | POA: Diagnosis not present

## 2021-08-07 DIAGNOSIS — S7291XA Unspecified fracture of right femur, initial encounter for closed fracture: Secondary | ICD-10-CM | POA: Diagnosis not present

## 2021-08-07 DIAGNOSIS — S72001A Fracture of unspecified part of neck of right femur, initial encounter for closed fracture: Secondary | ICD-10-CM | POA: Diagnosis not present

## 2021-08-07 DIAGNOSIS — Z96641 Presence of right artificial hip joint: Secondary | ICD-10-CM | POA: Diagnosis not present

## 2021-08-07 DIAGNOSIS — Z9889 Other specified postprocedural states: Secondary | ICD-10-CM | POA: Diagnosis not present

## 2021-08-07 DIAGNOSIS — Z79899 Other long term (current) drug therapy: Secondary | ICD-10-CM | POA: Diagnosis not present

## 2021-08-07 DIAGNOSIS — D122 Benign neoplasm of ascending colon: Secondary | ICD-10-CM | POA: Diagnosis not present

## 2021-08-07 DIAGNOSIS — K6289 Other specified diseases of anus and rectum: Secondary | ICD-10-CM | POA: Diagnosis not present

## 2021-08-07 DIAGNOSIS — R1319 Other dysphagia: Secondary | ICD-10-CM | POA: Diagnosis not present

## 2021-08-07 DIAGNOSIS — N3010 Interstitial cystitis (chronic) without hematuria: Secondary | ICD-10-CM | POA: Diagnosis not present

## 2021-08-07 DIAGNOSIS — E119 Type 2 diabetes mellitus without complications: Secondary | ICD-10-CM | POA: Diagnosis not present

## 2021-08-07 DIAGNOSIS — Z743 Need for continuous supervision: Secondary | ICD-10-CM | POA: Diagnosis not present

## 2021-08-07 DIAGNOSIS — M80051D Age-related osteoporosis with current pathological fracture, right femur, subsequent encounter for fracture with routine healing: Secondary | ICD-10-CM | POA: Diagnosis not present

## 2021-08-07 DIAGNOSIS — K635 Polyp of colon: Secondary | ICD-10-CM | POA: Diagnosis not present

## 2021-08-07 DIAGNOSIS — K5903 Drug induced constipation: Secondary | ICD-10-CM | POA: Diagnosis not present

## 2021-08-07 DIAGNOSIS — Z7982 Long term (current) use of aspirin: Secondary | ICD-10-CM | POA: Diagnosis not present

## 2021-08-07 DIAGNOSIS — E039 Hypothyroidism, unspecified: Secondary | ICD-10-CM | POA: Diagnosis not present

## 2021-08-07 DIAGNOSIS — N390 Urinary tract infection, site not specified: Secondary | ICD-10-CM | POA: Diagnosis not present

## 2021-08-07 DIAGNOSIS — S7291XD Unspecified fracture of right femur, subsequent encounter for closed fracture with routine healing: Secondary | ICD-10-CM | POA: Diagnosis not present

## 2021-08-07 DIAGNOSIS — I480 Paroxysmal atrial fibrillation: Secondary | ICD-10-CM | POA: Diagnosis not present

## 2021-08-07 DIAGNOSIS — R1031 Right lower quadrant pain: Secondary | ICD-10-CM | POA: Diagnosis not present

## 2021-08-07 DIAGNOSIS — K529 Noninfective gastroenteritis and colitis, unspecified: Secondary | ICD-10-CM | POA: Diagnosis not present

## 2021-08-07 DIAGNOSIS — R102 Pelvic and perineal pain: Secondary | ICD-10-CM | POA: Diagnosis not present

## 2021-08-07 DIAGNOSIS — R339 Retention of urine, unspecified: Secondary | ICD-10-CM | POA: Diagnosis not present

## 2021-08-07 DIAGNOSIS — R350 Frequency of micturition: Secondary | ICD-10-CM | POA: Diagnosis not present

## 2021-08-07 DIAGNOSIS — R935 Abnormal findings on diagnostic imaging of other abdominal regions, including retroperitoneum: Secondary | ICD-10-CM | POA: Diagnosis not present

## 2021-08-07 DIAGNOSIS — R2689 Other abnormalities of gait and mobility: Secondary | ICD-10-CM | POA: Diagnosis not present

## 2021-08-07 DIAGNOSIS — I10 Essential (primary) hypertension: Secondary | ICD-10-CM | POA: Diagnosis not present

## 2021-08-07 DIAGNOSIS — F32A Depression, unspecified: Secondary | ICD-10-CM | POA: Diagnosis not present

## 2021-08-07 DIAGNOSIS — R1032 Left lower quadrant pain: Secondary | ICD-10-CM | POA: Diagnosis not present

## 2021-08-07 DIAGNOSIS — K5730 Diverticulosis of large intestine without perforation or abscess without bleeding: Secondary | ICD-10-CM | POA: Diagnosis not present

## 2021-08-07 DIAGNOSIS — I119 Hypertensive heart disease without heart failure: Secondary | ICD-10-CM | POA: Diagnosis not present

## 2021-08-07 DIAGNOSIS — R498 Other voice and resonance disorders: Secondary | ICD-10-CM | POA: Diagnosis not present

## 2021-08-07 DIAGNOSIS — S72001D Fracture of unspecified part of neck of right femur, subsequent encounter for closed fracture with routine healing: Secondary | ICD-10-CM | POA: Diagnosis not present

## 2021-08-07 DIAGNOSIS — R300 Dysuria: Secondary | ICD-10-CM | POA: Diagnosis not present

## 2021-08-07 DIAGNOSIS — Z9181 History of falling: Secondary | ICD-10-CM | POA: Diagnosis not present

## 2021-08-07 DIAGNOSIS — Z794 Long term (current) use of insulin: Secondary | ICD-10-CM | POA: Diagnosis not present

## 2021-08-07 DIAGNOSIS — I4891 Unspecified atrial fibrillation: Secondary | ICD-10-CM | POA: Diagnosis not present

## 2021-08-07 DIAGNOSIS — J329 Chronic sinusitis, unspecified: Secondary | ICD-10-CM | POA: Diagnosis not present

## 2021-08-07 DIAGNOSIS — N3001 Acute cystitis with hematuria: Secondary | ICD-10-CM | POA: Diagnosis not present

## 2021-08-07 DIAGNOSIS — R3915 Urgency of urination: Secondary | ICD-10-CM | POA: Diagnosis not present

## 2021-08-07 DIAGNOSIS — Z7984 Long term (current) use of oral hypoglycemic drugs: Secondary | ICD-10-CM | POA: Diagnosis not present

## 2021-08-07 DIAGNOSIS — R109 Unspecified abdominal pain: Secondary | ICD-10-CM | POA: Diagnosis not present

## 2021-08-07 DIAGNOSIS — F419 Anxiety disorder, unspecified: Secondary | ICD-10-CM | POA: Diagnosis not present

## 2021-08-07 LAB — BASIC METABOLIC PANEL
Anion gap: 6 (ref 5–15)
BUN: 15 mg/dL (ref 8–23)
CO2: 27 mmol/L (ref 22–32)
Calcium: 7.9 mg/dL — ABNORMAL LOW (ref 8.9–10.3)
Chloride: 100 mmol/L (ref 98–111)
Creatinine, Ser: 0.56 mg/dL (ref 0.44–1.00)
GFR, Estimated: >60 mL/min (ref >60)
Glucose, Bld: 132 mg/dL — ABNORMAL HIGH (ref 70–99)
Potassium: 4.7 mmol/L (ref 3.5–5.1)
Sodium: 133 mmol/L — ABNORMAL LOW (ref 135–145)

## 2021-08-07 LAB — GLUCOSE, CAPILLARY: Glucose-Capillary: 128 mg/dL — ABNORMAL HIGH (ref 70–99)

## 2021-08-07 LAB — CBC
HCT: 30.3 % — ABNORMAL LOW (ref 36.0–46.0)
Hemoglobin: 9.3 g/dL — ABNORMAL LOW (ref 12.0–15.0)
MCH: 28.4 pg (ref 26.0–34.0)
MCHC: 30.7 g/dL (ref 30.0–36.0)
MCV: 92.7 fL (ref 80.0–100.0)
Platelets: 201 10*3/uL (ref 150–400)
RBC: 3.27 MIL/uL — ABNORMAL LOW (ref 3.87–5.11)
RDW: 14 % (ref 11.5–15.5)
WBC: 10.3 10*3/uL (ref 4.0–10.5)
nRBC: 0 % (ref 0.0–0.2)

## 2021-08-07 MED ORDER — POLYETHYLENE GLYCOL 3350 17 G PO PACK
17.0000 g | PACK | Freq: Every day | ORAL | 0 refills | Status: DC
Start: 2021-08-08 — End: 2021-08-23

## 2021-08-07 MED ORDER — HYDROCODONE-ACETAMINOPHEN 5-325 MG PO TABS: 1.0000 | ORAL_TABLET | Freq: Four times a day (QID) | ORAL | 0 refills | Status: DC | PRN

## 2021-08-07 MED ORDER — ENSURE ENLIVE PO LIQD: 237.0000 mL | Freq: Two times a day (BID) | ORAL | 12 refills | Status: DC

## 2021-08-07 MED ORDER — HYDROCODONE-ACETAMINOPHEN 5-325 MG PO TABS: 1.0000 | ORAL_TABLET | Freq: Four times a day (QID) | ORAL | 0 refills | Status: AC | PRN

## 2021-08-07 MED ORDER — SENNOSIDES-DOCUSATE SODIUM 8.6-50 MG PO TABS: 1.0000 | ORAL_TABLET | Freq: Two times a day (BID) | ORAL | Status: DC

## 2021-08-07 MED ORDER — ADULT MULTIVITAMIN W/MINERALS CH: 1.0000 | ORAL_TABLET | Freq: Every day | ORAL | 0 refills | Status: DC

## 2021-08-07 MED ORDER — ACETAMINOPHEN 325 MG PO TABS: 650.0000 mg | ORAL_TABLET | Freq: Four times a day (QID) | ORAL | 0 refills | Status: AC | PRN

## 2021-08-07 MED ORDER — SENNOSIDES-DOCUSATE SODIUM 8.6-50 MG PO TABS: 1.0000 | ORAL_TABLET | Freq: Two times a day (BID) | ORAL | 0 refills | Status: DC

## 2021-08-07 NOTE — Progress Notes (Signed)
Report given to RN of [employer]. ?

## 2021-08-07 NOTE — Progress Notes (Signed)
? ?  ORTHOPAEDIC PROGRESS NOTE ? ?Status post Right Hip hemiarthroplasty ? ?DOS: 08/05/21 ? ?SUBJECTIVE: ?Improved confusion this morning.  Worked with PT/OT yesterday.  No issues over night.  Waiting for SNF placement ? ? ?OBJECTIVE: ?PE: ? ?Alert and oriented, no acute distress.   ? ?Hip abduction pillow is in place. ?Surgical dressing is clean, dry and intact.  Active motion intact in the EHL/TA. ?Toes are warm and well-perfused.  2+ DP pulse. ? ?Vitals:  ? 08/07/21 0413 08/07/21 0514  ?BP: 137/64   ?Pulse: 89 82  ?Resp: 19   ?Temp: 98.3 ?F (36.8 ?C)   ?SpO2: (!) 87% 92%  ? ?CBC Latest Ref Rng & Units 08/07/2021 08/06/2021 08/04/2021  ?WBC 4.0 - 10.5 K/uL 10.3 9.9 8.6  ?Hemoglobin 12.0 - 15.0 g/dL 9.3(L) 9.6(L) 11.7(L)  ?Hematocrit 36.0 - 46.0 % 30.3(L) 30.1(L) 38.1  ?Platelets 150 - 400 K/uL 201 172 199  ? ? ? ?ASSESSMENT: ?Yolanda Franco is a 88 y.o. female doing well.  Stable following surgery.  Dispo pending ? ?PLAN: ?Weightbearing: WBAT RLE ?Hip abduction pillow in place at all times while in bed.  Okay to remove for therapy. ?Insicional and dressing care: Reinforce dressings as needed ?Orthopedic device(s): Hip abduction pillow ?VTE prophylaxis: Okay to resume Eliquis POD#1 ?Pain control: PO pain medications as needed; judicious use of narcotics ?Follow - up plan: 2 weeks.  If the patient discharges to a facility, the sutures can be trimmed and dressing changed around 2 weeks after surgery.  If the facility have any questions or concerns, we can see her in clinic in 2 weeks.  Otherwise, she can return to clinic for repeat evaluation at 6 weeks following surgery. ? ? ?Contact information:   ? ? ?Jamiel Goncalves A. Amedeo Kinsman, MD MS ?[facility] ?[address] ?[place],  [place]  [address] ?Phone: [phone] ?Fax: [phone] ?  ? ?

## 2021-08-07 NOTE — TOC Transition Note (Signed)
Transition of Care (TOC) - CM/SW Discharge Note ? ? ?Patient Details  ?Name: Yolanda Franco ?MRN: [id number] ?Date of Birth: 1933/04/27 ? ?Transition of Care (TOC) CM/SW Contact:  ?Salome Arnt, LCSW ?Phone Number: ?08/07/2021, 9:20 AM ? ? ?Clinical Narrative:  Pt d/c today to [facility]. Authorization received: [id number], next review 3/3. Discussed d/c with pt and son who request transport via [facility]. D/C summary sent to SNF. Facility aware of d/c and ready for pt after 10:00. RN given number to call report.   ? ? ? ?Final next level of care: [place] ?Barriers to Discharge: Barriers Resolved ? ? ?Patient Goals and CMS Choice ?Patient states their goals for this hospitalization and ongoing recovery are:: short term rehab ?  ?Choice offered to / list presented to : Adult Children ? ?Discharge Placement ?  ?           ?Patient chooses bed at: [facility] ?Patient to be transferred to facility by: [facility] ?Name of family member notified: Chrissie Noa- son ?Patient and family notified of of transfer: 08/07/21 ? ?Discharge Plan and Services ?In-house Referral: Clinical Social Work ?  ?Post Acute Care Choice: [place]          ?  ?  ?  ?  ?  ?  ?  ?  ?  ?  ? ?Social Determinants of Health (SDOH) Interventions ?  ? ? ?Readmission Risk Interventions ?No flowsheet data found. ? ? ? ? ?

## 2021-08-07 NOTE — Progress Notes (Signed)
Pt slept well throughout the night. Medicated x1 for reports of right hip pain-effective. Pt partially compliant with application of abduction pillow, will not secure application to legs, however allows pillow to remain between legs.  ?

## 2021-08-07 NOTE — Discharge Summary (Addendum)
Physician Discharge Summary   Patient: Yolanda Franco MRN: [id number] DOB: [birth year]  Admit date:     08/03/2021  Discharge date: 08/07/21  Discharge Physician: Kendell Bane   PCP: Aggie Cosier, MD   Recommendations at discharge:    Ortho. Dr. Michaelene Song in 1-2 weeks   F/up with PCP in 1 week  F/up with Palliative care as out patient in 2-4 weeks or sooner   Discharge Diagnoses: Principal Problem:   Closed displaced fracture of right femoral neck (HCC) Active Problems:   Paroxysmal atrial fibrillation (HCC)   Depression   Hypothyroidism   Essential hypertension  Resolved Problems:   * No resolved hospital problems. [facility] Course: Female with a history of hypertension, atrial fibrillation on apixaban, interstitial cystitis, anxiety/depression, diabetes mellitus type 2, and hypothyroidism presenting with mechanical fall.  The patient was getting up from her commode transfer back to her bed when she slipped and fell onto her right side and buttocks in the early morning of 08/03/2021.  There was no syncope.  The patient has not had any frequent falls, but does use a walker to assist with ambulation.  She denies any recent fevers, chills, headache, neck pain, chest pain, shortness of breath, coughing, hemoptysis, nausea, vomiting, diarrhea, abdominal pain, dysuria, hematuria.  The patient did recently have a colonoscopy performed on 07/30/2021 which showed numerous polyps.  She has a follow-up appointment with her gastroenterologist on 08/06/2021.  She was not started on any new medications after her colonoscopy.  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  There is no hematochezia or melena.  She denies any previous history of stroke or coronary disease or MI. In the ED, the patient was afebrile hemodynamically stable with  oxygen saturation 92% on room air. BMP showed sodium 142, potassium 3.6, serum creatinine 0.46.  LFTs were unremarkable.  WBC 9.5, hemoglobin 12.3, platelets 211,000.  EKG shows sinus rhythm and nonspecific T wave changes.  X-ray of the pelvis that showed a displaced right femoral neck fracture.  Orthopedics, Dr. Dallas Schimke was consulted to assist.  She underwent right hemiarthroplasty on 08/05/21.  PT recommended SNF with which patient agreed.  Assessment and Plan: * Closed displaced fracture of right femoral neck (HCC)- (present on admission) Ortho consulted--Dr. Dallas Schimke Operative repair planned on 08/05/21 Judicious opioids 2/27--Right hemiarthroplasty PT>>SNF  Essential hypertension Continue amlodpine  Hypothyroidism Continue levothyroxine  Depression Continue fluoxetine, trazodone and mirtazipine  Paroxysmal atrial fibrillation (HCC) Currently in sinus Personally reviewed EKG--sinus, nonspecific [employer] apixaban for surgery Resume apixaban post-op on 08/07/21    Consultants: Ortho. Dr. Michaelene Song Procedures performed: ORIF Disposition: Skilled nursing facility Diet recommendation:  Discharge Diet Orders (From admission, onward)     Start     Ordered   08/07/21 0000  Diet - low sodium heart healthy        08/07/21 0755           Regular diet  DISCHARGE MEDICATION: Allergies as of 08/07/2021       Reactions   Aspirin    Codeine    Causes Migraines   Eggs Or Egg-derived Products    Causes Migraine        Medication List     STOP taking these medications    hydrocortisone-pramoxine rectal foam Commonly known as: PROCTOFOAM-HC   metroNIDAZOLE  500 MG tablet Commonly known as: Flagyl       TAKE these medications    acetaminophen 325 MG tablet Commonly known as: TYLENOL Take 2 tablets (650 mg total) by mouth every 6 (six) hours as needed for mild pain.   amLODipine 5 MG tablet Commonly known as: NORVASC Take 5 mg by mouth daily.   apixaban 2.5 MG  Tabs tablet Commonly known as: ELIQUIS Take by mouth 2 (two) times daily.   feeding supplement Liqd Take 237 mLs by mouth 2 (two) times daily between meals.   FLUoxetine 40 MG capsule Commonly known as: PROZAC Take 40 mg by mouth daily.   Glucosamine 500 MG Caps Take by mouth.   HYDROcodone-acetaminophen 5-325 MG tablet Commonly known as: NORCO/VICODIN Take 1 tablet by mouth every 6 (six) hours as needed for up to 5 days for moderate pain.   insulin detemir 100 UNIT/ML injection Commonly known as: LEVEMIR Inject 10 Units into the skin daily.   levothyroxine 25 MCG tablet Commonly known as: SYNTHROID Take 12 mcg by mouth daily before breakfast. 1/2 tab qd   mirtazapine 15 MG tablet Commonly known as: REMERON Take 15 mg by mouth at bedtime.   multivitamin with minerals Tabs tablet Take 1 tablet by mouth daily.   polyethylene glycol 17 g packet Commonly known as: MIRALAX / GLYCOLAX Take 17 g by mouth daily. Start taking on: August 08, 2021   senna-docusate 8.6-50 MG tablet Commonly known as: Senokot-S Take 1 tablet by mouth 2 (two) times daily.   traZODone 150 MG tablet Commonly known as: DESYREL Take 150 mg by mouth at bedtime.               Discharge Care Instructions  (From admission, onward)           Start     Ordered   08/07/21 0000  Discharge wound care:       Comments: Per RN , Ortho   08/07/21 0755            Contact information for after-discharge care     Destination     [facility]erred [facility] .   Service: Skilled Nursing Contact information: [address] [place] [address] [phone]                     Discharge Exam: Ceasar Mons Weights   08/03/21 0757 08/05/21 0802  Weight: 59 kg 59 kg      Physical Exam:   General:  Alert, oriented, cooperative, no distress;   HEENT:  Normocephalic, PERRL, otherwise with in Normal limits   Neuro:  CNII-XII intact. , normal motor and sensation,  reflexes intact   Lungs:   Clear to auscultation BL, Respirations unlabored, no wheezes / crackles  Cardio:    S1/S2, RRR, No murmure, No Rubs or Gallops   Abdomen:   Soft, non-tender, bowel sounds active all four quadrants,  no guarding or peritoneal signs.  Muscular skeletal:  Limited exam - in bed, able to move all 4 extremities, Normal strength,  2+ pulses,  symmetric, mild Left LE pitting edema  Skin:  Dry, warm to touch, negative for any Rashes,  Wounds: Please see nursing documentation          Condition at discharge: stable  The results of significant diagnostics from this hospitalization (including imaging, microbiology, ancillary and laboratory) are listed below for reference.   Imaging Studies: Chest Portable 1 View  Result Date: 08/03/2021 CLINICAL DATA:  Preop evaluation for hip surgery EXAM: PORTABLE CHEST 1 VIEW COMPARISON:  CT done on 11/03/2013 FINDINGS: Cardiac size is within normal limits. Thoracic aorta is tortuous there are no signs of pulmonary edema or focal pulmonary consolidation. Left hemidiaphragm is elevated. There is no pleural effusion or pneumothorax. IMPRESSION: There are no signs of pulmonary edema or focal pulmonary consolidation. Electronically Signed   By: Ernie Avena M.D.   On: 08/03/2021 12:19   DG HIP UNILAT WITH PELVIS 2-3 VIEWS RIGHT  Result Date: 08/05/2021 CLINICAL DATA:  Status post right hip replacement. EXAM: DG HIP (WITH OR WITHOUT PELVIS) 2-3V RIGHT COMPARISON:  August 03, 2021. FINDINGS: Right femoral and acetabular components are well situated. Expected postoperative changes are noted in the surrounding soft tissues. IMPRESSION: Status post right total hip arthroplasty. Electronically Signed   By: Lupita Raider M.D.   On: 08/05/2021 12:28   DG Hip Unilat  With Pelvis 2-3 Views Right  Result Date: 08/03/2021 CLINICAL DATA:  Fall with right hip pain EXAM: DG HIP (WITH OR WITHOUT PELVIS) 2-3V RIGHT COMPARISON:  07/07/2021  abdominal CT FINDINGS: Right femoral neck fracture with displacement and varus deformity. No evidence of pelvic ring fracture or diastasis. Pelvis is rotated to the right. Generalized osteopenia. IMPRESSION: Displaced right femoral neck fracture. Electronically Signed   By: Tiburcio Pea M.D.   On: 08/03/2021 08:49    Microbiology: Results for orders placed or performed during the hospital encounter of 08/03/21  Resp Panel by RT-PCR (Flu A&B, Covid) Nasopharyngeal Swab     Status: None   Collection Time: 08/03/21  8:23 AM   Specimen: Nasopharyngeal Swab; Nasopharyngeal(NP) swabs in vial transport medium  Result Value Ref Range Status   SARS Coronavirus 2 by RT PCR NEGATIVE NEGATIVE Final    Comment: (NOTE) SARS-CoV-2 target nucleic acids are NOT DETECTED.  The SARS-CoV-2 RNA is generally detectable in upper respiratory specimens during the acute phase of infection. The lowest concentration of SARS-CoV-2 viral copies this assay can detect is 138 copies/mL. A negative result does not preclude SARS-Cov-2 infection and should not be used as the sole basis for treatment or other patient management decisions. A negative result may occur with  improper specimen collection/handling, submission of specimen other than nasopharyngeal swab, presence of viral mutation(s) within the areas targeted by this assay, and inadequate number of viral copies(<138 copies/mL). A negative result must be combined with clinical observations, patient history, and epidemiological information. The expected result is Negative.  Fact Sheet for Patients:  BloggerCourse.com  Fact Sheet for Healthcare Providers:  SeriousBroker.it  This test is no t yet approved or cleared by the [place] FDA and  has been authorized for detection and/or diagnosis of SARS-CoV-2 by FDA under an Emergency Use Authorization (EUA). This EUA will remain  in effect (meaning this test  can be used) for the duration of the COVID-19 declaration under Section 564(b)(1) of the Act, 21 U.S.C.section 360bbb-3(b)(1), unless the authorization is terminated  or revoked sooner.       Influenza A by PCR NEGATIVE NEGATIVE Final   Influenza B by PCR NEGATIVE NEGATIVE Final    Comment: (NOTE) The Xpert Xpress SARS-CoV-2/FLU/RSV plus assay is intended as an aid in the diagnosis of influenza from Nasopharyngeal swab specimens and should not be used as a sole basis for treatment. Nasal washings and aspirates are unacceptable for Xpert Xpress SARS-CoV-2/FLU/RSV testing.  Fact Sheet for Patients: BloggerCourse.com  Fact Sheet for Healthcare Providers: SeriousBroker.it  This test is not  yet approved or cleared by the [place] and has been authorized for detection and/or diagnosis of SARS-CoV-2 by FDA under an Emergency Use Authorization (EUA). This EUA will remain in effect (meaning this test can be used) for the duration of the COVID-19 declaration under Section 564(b)(1) of the Act, 21 U.S.C. section 360bbb-3(b)(1), unless the authorization is terminated or revoked.  Performed at [facility], [address], [place], [place] [address]   Surgical PCR screen     Status: None   Collection Time: 08/04/21 11:08 AM   Specimen: Nasal Mucosa; Nasal Swab  Result Value Ref Range Status   MRSA, PCR NEGATIVE NEGATIVE Final   Staphylococcus aureus NEGATIVE NEGATIVE Final    Comment: (NOTE) The Xpert SA Assay (FDA approved for NASAL specimens in patients 81 years of age and older), is one component of a comprehensive surveillance program. It is not intended to diagnose infection nor to guide or monitor treatment. Performed at [facility], [address], [place], [place] [address]     Labs: CBC: Recent Labs  Lab 08/03/21 [phone] 08/04/21 0536 08/06/21 0529 08/07/21 0555  WBC 9.5 8.6 9.9 10.3  NEUTROABS 8.2*  --   --    --   HGB 12.3 11.7* 9.6* 9.3*  HCT 38.4 38.1 30.1* 30.3*  MCV 93.0 93.4 94.4 92.7  PLT 211 199 172 201   Basic Metabolic Panel: Recent Labs  Lab 08/03/21 0926 08/03/21 0940 08/04/21 0536 08/06/21 0529 08/07/21 0555  NA 142  --  136 133* 133*  K 3.6  --  4.0 4.5 4.7  CL 109  --  103 100 100  CO2 27  --  26 26 27   GLUCOSE 95  --  148* 137* 132*  BUN 9  --  17 13 15   CREATININE 0.46  --  0.73 0.60 0.56  CALCIUM 8.6*  --  8.6* 7.7* 7.9*  MG  --  2.0 2.0 2.1  --    Liver Function Tests: Recent Labs  Lab 08/03/21 0926  AST 19  ALT 18  ALKPHOS 49  BILITOT 0.3  PROT 6.3*  ALBUMIN 3.4*   CBG: Recent Labs  Lab 08/06/21 0734 08/06/21 1116 08/06/21 1645 08/06/21 2059 08/07/21 0703  GLUCAP 123* 183* 181* 142* 128*    Discharge time spent: greater than 30 minutes.  Signed: Kendell Bane, MD [facility] 08/07/2021

## 2021-08-08 ENCOUNTER — Telehealth: Payer: Self-pay

## 2021-08-08 ENCOUNTER — Encounter (HOSPITAL_COMMUNITY): Payer: Self-pay | Admitting: Orthopedic Surgery

## 2021-08-08 DIAGNOSIS — E119 Type 2 diabetes mellitus without complications: Secondary | ICD-10-CM | POA: Diagnosis not present

## 2021-08-08 DIAGNOSIS — D649 Anemia, unspecified: Secondary | ICD-10-CM | POA: Diagnosis not present

## 2021-08-08 DIAGNOSIS — J329 Chronic sinusitis, unspecified: Secondary | ICD-10-CM | POA: Diagnosis not present

## 2021-08-08 DIAGNOSIS — I480 Paroxysmal atrial fibrillation: Secondary | ICD-10-CM | POA: Diagnosis not present

## 2021-08-08 DIAGNOSIS — E039 Hypothyroidism, unspecified: Secondary | ICD-10-CM | POA: Diagnosis not present

## 2021-08-08 DIAGNOSIS — Z794 Long term (current) use of insulin: Secondary | ICD-10-CM | POA: Diagnosis not present

## 2021-08-08 DIAGNOSIS — J9601 Acute respiratory failure with hypoxia: Secondary | ICD-10-CM | POA: Diagnosis not present

## 2021-08-08 DIAGNOSIS — I10 Essential (primary) hypertension: Secondary | ICD-10-CM | POA: Diagnosis not present

## 2021-08-08 DIAGNOSIS — K6289 Other specified diseases of anus and rectum: Secondary | ICD-10-CM | POA: Diagnosis not present

## 2021-08-08 DIAGNOSIS — K529 Noninfective gastroenteritis and colitis, unspecified: Secondary | ICD-10-CM | POA: Diagnosis not present

## 2021-08-08 DIAGNOSIS — S7291XA Unspecified fracture of right femur, initial encounter for closed fracture: Secondary | ICD-10-CM | POA: Diagnosis not present

## 2021-08-08 NOTE — Telephone Encounter (Addendum)
Records received from patient's last colonosocopy that was done 07/2021 in [place].  Called patient who stated she just moved in to a senior facility this month.  Will verify with them and have a referral sent over. ?

## 2021-08-12 DIAGNOSIS — E119 Type 2 diabetes mellitus without complications: Secondary | ICD-10-CM | POA: Diagnosis not present

## 2021-08-12 DIAGNOSIS — Z9981 Dependence on supplemental oxygen: Secondary | ICD-10-CM | POA: Diagnosis not present

## 2021-08-12 DIAGNOSIS — J329 Chronic sinusitis, unspecified: Secondary | ICD-10-CM | POA: Diagnosis not present

## 2021-08-12 DIAGNOSIS — S7291XD Unspecified fracture of right femur, subsequent encounter for closed fracture with routine healing: Secondary | ICD-10-CM | POA: Diagnosis not present

## 2021-08-13 DIAGNOSIS — I119 Hypertensive heart disease without heart failure: Secondary | ICD-10-CM | POA: Diagnosis not present

## 2021-08-13 DIAGNOSIS — E119 Type 2 diabetes mellitus without complications: Secondary | ICD-10-CM | POA: Diagnosis not present

## 2021-08-13 DIAGNOSIS — Z9181 History of falling: Secondary | ICD-10-CM | POA: Diagnosis not present

## 2021-08-13 DIAGNOSIS — M80051D Age-related osteoporosis with current pathological fracture, right femur, subsequent encounter for fracture with routine healing: Secondary | ICD-10-CM | POA: Diagnosis not present

## 2021-08-13 DIAGNOSIS — N390 Urinary tract infection, site not specified: Secondary | ICD-10-CM | POA: Diagnosis not present

## 2021-08-13 DIAGNOSIS — J9601 Acute respiratory failure with hypoxia: Secondary | ICD-10-CM | POA: Diagnosis not present

## 2021-08-13 DIAGNOSIS — I4891 Unspecified atrial fibrillation: Secondary | ICD-10-CM | POA: Diagnosis not present

## 2021-08-13 DIAGNOSIS — E039 Hypothyroidism, unspecified: Secondary | ICD-10-CM | POA: Diagnosis not present

## 2021-08-13 DIAGNOSIS — M6281 Muscle weakness (generalized): Secondary | ICD-10-CM | POA: Diagnosis not present

## 2021-08-14 DIAGNOSIS — I10 Essential (primary) hypertension: Secondary | ICD-10-CM | POA: Diagnosis not present

## 2021-08-15 DIAGNOSIS — E119 Type 2 diabetes mellitus without complications: Secondary | ICD-10-CM | POA: Diagnosis not present

## 2021-08-15 DIAGNOSIS — M80051D Age-related osteoporosis with current pathological fracture, right femur, subsequent encounter for fracture with routine healing: Secondary | ICD-10-CM | POA: Diagnosis not present

## 2021-08-15 DIAGNOSIS — R339 Retention of urine, unspecified: Secondary | ICD-10-CM | POA: Diagnosis not present

## 2021-08-15 DIAGNOSIS — J9601 Acute respiratory failure with hypoxia: Secondary | ICD-10-CM | POA: Diagnosis not present

## 2021-08-15 DIAGNOSIS — I10 Essential (primary) hypertension: Secondary | ICD-10-CM | POA: Diagnosis not present

## 2021-08-15 DIAGNOSIS — N390 Urinary tract infection, site not specified: Secondary | ICD-10-CM | POA: Diagnosis not present

## 2021-08-15 DIAGNOSIS — I4891 Unspecified atrial fibrillation: Secondary | ICD-10-CM | POA: Diagnosis not present

## 2021-08-15 DIAGNOSIS — M6281 Muscle weakness (generalized): Secondary | ICD-10-CM | POA: Diagnosis not present

## 2021-08-15 DIAGNOSIS — E039 Hypothyroidism, unspecified: Secondary | ICD-10-CM | POA: Diagnosis not present

## 2021-08-15 DIAGNOSIS — I119 Hypertensive heart disease without heart failure: Secondary | ICD-10-CM | POA: Diagnosis not present

## 2021-08-15 DIAGNOSIS — I480 Paroxysmal atrial fibrillation: Secondary | ICD-10-CM | POA: Diagnosis not present

## 2021-08-15 DIAGNOSIS — Z9181 History of falling: Secondary | ICD-10-CM | POA: Diagnosis not present

## 2021-08-16 DIAGNOSIS — I10 Essential (primary) hypertension: Secondary | ICD-10-CM | POA: Diagnosis not present

## 2021-08-18 DIAGNOSIS — R109 Unspecified abdominal pain: Secondary | ICD-10-CM | POA: Diagnosis not present

## 2021-08-19 DIAGNOSIS — N3010 Interstitial cystitis (chronic) without hematuria: Secondary | ICD-10-CM | POA: Diagnosis not present

## 2021-08-19 DIAGNOSIS — M6281 Muscle weakness (generalized): Secondary | ICD-10-CM | POA: Diagnosis not present

## 2021-08-19 DIAGNOSIS — E119 Type 2 diabetes mellitus without complications: Secondary | ICD-10-CM | POA: Diagnosis not present

## 2021-08-19 DIAGNOSIS — M80051D Age-related osteoporosis with current pathological fracture, right femur, subsequent encounter for fracture with routine healing: Secondary | ICD-10-CM | POA: Diagnosis not present

## 2021-08-19 DIAGNOSIS — J9601 Acute respiratory failure with hypoxia: Secondary | ICD-10-CM | POA: Diagnosis not present

## 2021-08-19 DIAGNOSIS — Z9181 History of falling: Secondary | ICD-10-CM | POA: Diagnosis not present

## 2021-08-19 DIAGNOSIS — N390 Urinary tract infection, site not specified: Secondary | ICD-10-CM | POA: Diagnosis not present

## 2021-08-19 DIAGNOSIS — E039 Hypothyroidism, unspecified: Secondary | ICD-10-CM | POA: Diagnosis not present

## 2021-08-19 DIAGNOSIS — I4891 Unspecified atrial fibrillation: Secondary | ICD-10-CM | POA: Diagnosis not present

## 2021-08-19 DIAGNOSIS — K635 Polyp of colon: Secondary | ICD-10-CM | POA: Diagnosis not present

## 2021-08-19 DIAGNOSIS — S7291XD Unspecified fracture of right femur, subsequent encounter for closed fracture with routine healing: Secondary | ICD-10-CM | POA: Diagnosis not present

## 2021-08-19 DIAGNOSIS — I119 Hypertensive heart disease without heart failure: Secondary | ICD-10-CM | POA: Diagnosis not present

## 2021-08-20 ENCOUNTER — Encounter: Payer: Self-pay | Admitting: Orthopedic Surgery

## 2021-08-20 ENCOUNTER — Ambulatory Visit (INDEPENDENT_AMBULATORY_CARE_PROVIDER_SITE_OTHER): Payer: Medicare Other | Admitting: Orthopedic Surgery

## 2021-08-20 ENCOUNTER — Ambulatory Visit (INDEPENDENT_AMBULATORY_CARE_PROVIDER_SITE_OTHER)

## 2021-08-20 ENCOUNTER — Other Ambulatory Visit: Payer: Self-pay

## 2021-08-20 DIAGNOSIS — S72001D Fracture of unspecified part of neck of right femur, subsequent encounter for closed fracture with routine healing: Secondary | ICD-10-CM | POA: Diagnosis not present

## 2021-08-20 NOTE — Progress Notes (Signed)
Orthopaedic Postop Note ? ?Assessment: ?Yolanda Franco is a 88 y.o. female s/p Right hip hemiarthroplasty ? ?DOS: 08/05/21 ? ?Plan: ?Sutures trimmed, steri strips placed ?Continue using hip abduction pillow while in bed until 6 weeks after surgery ?Continue posterior hip precautions until 3 months postop ?Continue with DVT prophylaxis for at least 6 weeks after surgery ?WBAT on the operative extremity ?Follow up in 4 weeks; call with any issues ? ?Patient scheduled for consultation for abdominal surgery.  As a result, follow up can be adjusted accordingly.  We can see her any time within the next 1-2 months.  ? ?Follow-up: ?No follow-ups on file. ?XR at next visit: AP pelvis and Right hip ? ?Subjective: ? ?Chief Complaint  ?Patient presents with  ? Hip Pain  ?  Right hip postop- DOS 08/05/21 Right hip hemiarthroplasty- doing well, no hip pain, says she has had a UTI x 2 weeks, took 2 abx, not better.  Supposed to start cipro today.   ? ? ?History of Present Illness: ?Yolanda Franco is a 88 y.o. female who presents following the above stated procedure.  She is not having pain in her hip.  She was discharged to a nursing facility, and remains there today.  She continues to have issues with the UTI.  She has had multiple medications, without improvement in her symptoms.  She states she is starting ciprofloxacin today and, as a result.  No issues with her incisions. ? ?Review of Systems: ?No fevers or chills ?No numbness or tingling ?No Chest Pain ?No shortness of breath ? ? ?Objective: ?There were no vitals taken for this visit. ? ?Physical Exam: ? ?Alert and oriented.  No acute distress.  Seated in a wheelchair. ? ?Surgical incision is healing well.  No surrounding erythema or drainage.  She tolerates gentle range of motion of the right hip.  She is able to maintain a straight leg raise.  Sensations intact over the dorsum of her foot.  Active motion intact in the TA/EHL. ? ?IMAGING: ?I personally ordered and  reviewed the following images: ? ?XR of the Right hip and AP pelvis demonstrates a press-fit hip arthroplasty in good position.  The hip remains reduced.  There is no evidence of implant subsidence.  No acute fractures are noted. ? ?Impression: Right hip hemiarthroplasty in stable position, without evidence of migration or subsidence compared to prior XR  ? ?Mordecai Rasmussen, MD ?08/20/2021 ?12:14 PM ? ? ?

## 2021-08-21 ENCOUNTER — Emergency Department (HOSPITAL_COMMUNITY): Payer: Medicare Other

## 2021-08-21 ENCOUNTER — Encounter (HOSPITAL_COMMUNITY): Payer: Self-pay | Admitting: Emergency Medicine

## 2021-08-21 ENCOUNTER — Other Ambulatory Visit: Payer: Self-pay

## 2021-08-21 ENCOUNTER — Observation Stay (HOSPITAL_COMMUNITY)
Admission: EM | Admit: 2021-08-21 | Discharge: 2021-08-23 | Disposition: A | Discharge status: Skilled Nursing Facility | Payer: Medicare Other | Attending: Student | Admitting: Student

## 2021-08-21 DIAGNOSIS — N3001 Acute cystitis with hematuria: Secondary | ICD-10-CM | POA: Diagnosis not present

## 2021-08-21 DIAGNOSIS — K5900 Constipation, unspecified: Secondary | ICD-10-CM | POA: Diagnosis not present

## 2021-08-21 DIAGNOSIS — R935 Abnormal findings on diagnostic imaging of other abdominal regions, including retroperitoneum: Secondary | ICD-10-CM | POA: Diagnosis not present

## 2021-08-21 DIAGNOSIS — Z79899 Other long term (current) drug therapy: Secondary | ICD-10-CM | POA: Insufficient documentation

## 2021-08-21 DIAGNOSIS — R350 Frequency of micturition: Secondary | ICD-10-CM | POA: Diagnosis not present

## 2021-08-21 DIAGNOSIS — Z86010 Personal history of colonic polyps: Secondary | ICD-10-CM | POA: Insufficient documentation

## 2021-08-21 DIAGNOSIS — M25551 Pain in right hip: Secondary | ICD-10-CM | POA: Diagnosis not present

## 2021-08-21 DIAGNOSIS — M21962 Unspecified acquired deformity of left lower leg: Secondary | ICD-10-CM | POA: Insufficient documentation

## 2021-08-21 DIAGNOSIS — Z96641 Presence of right artificial hip joint: Secondary | ICD-10-CM | POA: Insufficient documentation

## 2021-08-21 DIAGNOSIS — Z9181 History of falling: Secondary | ICD-10-CM | POA: Insufficient documentation

## 2021-08-21 DIAGNOSIS — Z7984 Long term (current) use of oral hypoglycemic drugs: Secondary | ICD-10-CM | POA: Diagnosis not present

## 2021-08-21 DIAGNOSIS — R1030 Lower abdominal pain, unspecified: Secondary | ICD-10-CM

## 2021-08-21 DIAGNOSIS — W19XXXD Unspecified fall, subsequent encounter: Secondary | ICD-10-CM | POA: Insufficient documentation

## 2021-08-21 DIAGNOSIS — R970 Elevated carcinoembryonic antigen [CEA]: Secondary | ICD-10-CM | POA: Insufficient documentation

## 2021-08-21 DIAGNOSIS — R102 Pelvic and perineal pain: Secondary | ICD-10-CM | POA: Insufficient documentation

## 2021-08-21 DIAGNOSIS — K5903 Drug induced constipation: Secondary | ICD-10-CM

## 2021-08-21 DIAGNOSIS — K5730 Diverticulosis of large intestine without perforation or abscess without bleeding: Secondary | ICD-10-CM | POA: Diagnosis not present

## 2021-08-21 DIAGNOSIS — K635 Polyp of colon: Secondary | ICD-10-CM | POA: Diagnosis present

## 2021-08-21 DIAGNOSIS — Z9889 Other specified postprocedural states: Secondary | ICD-10-CM | POA: Insufficient documentation

## 2021-08-21 DIAGNOSIS — R10819 Abdominal tenderness, unspecified site: Secondary | ICD-10-CM | POA: Diagnosis not present

## 2021-08-21 DIAGNOSIS — R109 Unspecified abdominal pain: Secondary | ICD-10-CM | POA: Diagnosis present

## 2021-08-21 DIAGNOSIS — K6289 Other specified diseases of anus and rectum: Secondary | ICD-10-CM | POA: Insufficient documentation

## 2021-08-21 DIAGNOSIS — R3915 Urgency of urination: Secondary | ICD-10-CM | POA: Diagnosis not present

## 2021-08-21 DIAGNOSIS — E119 Type 2 diabetes mellitus without complications: Secondary | ICD-10-CM

## 2021-08-21 DIAGNOSIS — I480 Paroxysmal atrial fibrillation: Secondary | ICD-10-CM | POA: Diagnosis present

## 2021-08-21 DIAGNOSIS — N3010 Interstitial cystitis (chronic) without hematuria: Secondary | ICD-10-CM | POA: Diagnosis not present

## 2021-08-21 DIAGNOSIS — Z743 Need for continuous supervision: Secondary | ICD-10-CM | POA: Diagnosis not present

## 2021-08-21 DIAGNOSIS — R1032 Left lower quadrant pain: Secondary | ICD-10-CM | POA: Diagnosis not present

## 2021-08-21 DIAGNOSIS — Z7982 Long term (current) use of aspirin: Secondary | ICD-10-CM | POA: Insufficient documentation

## 2021-08-21 DIAGNOSIS — R300 Dysuria: Secondary | ICD-10-CM | POA: Diagnosis not present

## 2021-08-21 DIAGNOSIS — R599 Enlarged lymph nodes, unspecified: Secondary | ICD-10-CM | POA: Insufficient documentation

## 2021-08-21 DIAGNOSIS — R700 Elevated erythrocyte sedimentation rate: Secondary | ICD-10-CM | POA: Insufficient documentation

## 2021-08-21 DIAGNOSIS — I10 Essential (primary) hypertension: Secondary | ICD-10-CM | POA: Diagnosis not present

## 2021-08-21 DIAGNOSIS — F32A Depression, unspecified: Secondary | ICD-10-CM | POA: Diagnosis not present

## 2021-08-21 DIAGNOSIS — Z7901 Long term (current) use of anticoagulants: Secondary | ICD-10-CM | POA: Diagnosis not present

## 2021-08-21 DIAGNOSIS — N3000 Acute cystitis without hematuria: Principal | ICD-10-CM | POA: Insufficient documentation

## 2021-08-21 DIAGNOSIS — E039 Hypothyroidism, unspecified: Secondary | ICD-10-CM | POA: Diagnosis present

## 2021-08-21 DIAGNOSIS — R1031 Right lower quadrant pain: Secondary | ICD-10-CM | POA: Insufficient documentation

## 2021-08-21 DIAGNOSIS — N390 Urinary tract infection, site not specified: Secondary | ICD-10-CM | POA: Diagnosis not present

## 2021-08-21 DIAGNOSIS — S72001A Fracture of unspecified part of neck of right femur, initial encounter for closed fracture: Secondary | ICD-10-CM | POA: Diagnosis present

## 2021-08-21 DIAGNOSIS — Q791 Other congenital malformations of diaphragm: Secondary | ICD-10-CM | POA: Insufficient documentation

## 2021-08-21 DIAGNOSIS — F419 Anxiety disorder, unspecified: Secondary | ICD-10-CM | POA: Diagnosis not present

## 2021-08-21 DIAGNOSIS — Z794 Long term (current) use of insulin: Secondary | ICD-10-CM | POA: Diagnosis not present

## 2021-08-21 DIAGNOSIS — R2681 Unsteadiness on feet: Secondary | ICD-10-CM | POA: Insufficient documentation

## 2021-08-21 DIAGNOSIS — Z79890 Hormone replacement therapy (postmenopausal): Secondary | ICD-10-CM | POA: Insufficient documentation

## 2021-08-21 DIAGNOSIS — M6281 Muscle weakness (generalized): Secondary | ICD-10-CM | POA: Insufficient documentation

## 2021-08-21 DIAGNOSIS — S72001D Fracture of unspecified part of neck of right femur, subsequent encounter for closed fracture with routine healing: Secondary | ICD-10-CM | POA: Insufficient documentation

## 2021-08-21 LAB — CBC WITH DIFFERENTIAL/PLATELET
Abs Immature Granulocytes: 0.07 10*3/uL (ref 0.00–0.07)
Basophils Absolute: 0.1 10*3/uL (ref 0.0–0.1)
Basophils Relative: 1 %
Eosinophils Absolute: 0.2 10*3/uL (ref 0.0–0.5)
Eosinophils Relative: 3 %
HCT: 36.7 % (ref 36.0–46.0)
Hemoglobin: 11.5 g/dL — ABNORMAL LOW (ref 12.0–15.0)
Immature Granulocytes: 1 %
Lymphocytes Relative: 15 %
Lymphs Abs: 1.2 10*3/uL (ref 0.7–4.0)
MCH: 28.9 pg (ref 26.0–34.0)
MCHC: 31.3 g/dL (ref 30.0–36.0)
MCV: 92.2 fL (ref 80.0–100.0)
Monocytes Absolute: 0.6 10*3/uL (ref 0.1–1.0)
Monocytes Relative: 8 %
Neutro Abs: 5.9 10*3/uL (ref 1.7–7.7)
Neutrophils Relative %: 72 %
Platelets: 471 10*3/uL — ABNORMAL HIGH (ref 150–400)
RBC: 3.98 MIL/uL (ref 3.87–5.11)
RDW: 14.6 % (ref 11.5–15.5)
WBC: 8 10*3/uL (ref 4.0–10.5)
nRBC: 0 % (ref 0.0–0.2)

## 2021-08-21 LAB — COMPREHENSIVE METABOLIC PANEL
ALT: 13 U/L (ref 0–44)
AST: 15 U/L (ref 15–41)
Albumin: 3.5 g/dL (ref 3.5–5.0)
Alkaline Phosphatase: 75 U/L (ref 38–126)
Anion gap: 6 (ref 5–15)
BUN: 9 mg/dL (ref 8–23)
CO2: 26 mmol/L (ref 22–32)
Calcium: 8.9 mg/dL (ref 8.9–10.3)
Chloride: 104 mmol/L (ref 98–111)
Creatinine, Ser: 0.59 mg/dL (ref 0.44–1.00)
GFR, Estimated: >60 mL/min (ref >60)
Glucose, Bld: 116 mg/dL — ABNORMAL HIGH (ref 70–99)
Potassium: 4.3 mmol/L (ref 3.5–5.1)
Sodium: 136 mmol/L (ref 135–145)
Total Bilirubin: 0.4 mg/dL (ref 0.3–1.2)
Total Protein: 6.6 g/dL (ref 6.5–8.1)

## 2021-08-21 LAB — URINALYSIS, ROUTINE W REFLEX MICROSCOPIC
Glucose, UA: 250 mg/dL — AB
Hgb urine dipstick: NEGATIVE
Ketones, ur: NEGATIVE mg/dL
Nitrite: POSITIVE — AB
Protein, ur: 30 mg/dL — AB
Specific Gravity, Urine: 1.025 (ref 1.005–1.030)
pH: 6.5 (ref 5.0–8.0)

## 2021-08-21 LAB — URINALYSIS, MICROSCOPIC (REFLEX): Bacteria, UA: NONE SEEN

## 2021-08-21 LAB — SEDIMENTATION RATE: Sed Rate: 27 mm/hr — ABNORMAL HIGH (ref 0–22)

## 2021-08-21 LAB — LIPASE, BLOOD: Lipase: 24 U/L (ref 11–51)

## 2021-08-21 LAB — GLUCOSE, CAPILLARY: Glucose-Capillary: 157 mg/dL — ABNORMAL HIGH (ref 70–99)

## 2021-08-21 LAB — C-REACTIVE PROTEIN: CRP: 0.7 mg/dL (ref <1.0)

## 2021-08-21 MED ORDER — LEVOTHYROXINE SODIUM 25 MCG PO TABS
12.5000 ug | ORAL_TABLET | Freq: Every day | ORAL | Status: DC
  Filled 2021-08-21: qty 1

## 2021-08-21 MED ORDER — MIRTAZAPINE 15 MG PO TABS
15.0000 mg | ORAL_TABLET | Freq: Every day | ORAL | Status: DC
  Filled 2021-08-21: qty 1

## 2021-08-21 MED ORDER — ADULT MULTIVITAMIN W/MINERALS CH
1.0000 | ORAL_TABLET | Freq: Every day | ORAL | Status: DC
  Administered 2021-08-23: 1 via ORAL
  Filled 2021-08-21: qty 1

## 2021-08-21 MED ORDER — FLUOXETINE HCL 20 MG PO CAPS
40.0000 mg | ORAL_CAPSULE | Freq: Every day | ORAL | Status: DC
  Administered 2021-08-23: 40 mg via ORAL
  Filled 2021-08-21: qty 2

## 2021-08-21 MED ORDER — SODIUM CHLORIDE 0.9 % IV SOLN
1.0000 g | Freq: Every day | INTRAVENOUS | Status: DC
  Administered 2021-08-21 – 2021-08-22 (×2): 1 g via INTRAVENOUS
  Filled 2021-08-21 (×2): qty 10

## 2021-08-21 MED ORDER — IOHEXOL 300 MG/ML  SOLN
100.0000 mL | Freq: Once | INTRAMUSCULAR | Status: AC | PRN
  Administered 2021-08-21: 100 mL via INTRAVENOUS

## 2021-08-21 MED ORDER — INSULIN ASPART 100 UNIT/ML IJ SOLN
0.0000 [IU] | Freq: Every day | INTRAMUSCULAR | Status: DC
  Filled 2021-08-21: qty 0.05

## 2021-08-21 MED ORDER — ENOXAPARIN SODIUM 40 MG/0.4ML IJ SOSY
40.0000 mg | PREFILLED_SYRINGE | INTRAMUSCULAR | Status: DC
  Administered 2021-08-21 – 2021-08-22 (×2): 40 mg via SUBCUTANEOUS
  Filled 2021-08-21 (×2): qty 0.4

## 2021-08-21 MED ORDER — ONDANSETRON HCL 4 MG/2ML IJ SOLN: 4.0000 mg | Freq: Four times a day (QID) | INTRAMUSCULAR | Status: DC | PRN

## 2021-08-21 MED ORDER — ACETAMINOPHEN 325 MG PO TABS
650.0000 mg | ORAL_TABLET | Freq: Four times a day (QID) | ORAL | Status: DC | PRN
  Administered 2021-08-23: 650 mg via ORAL
  Filled 2021-08-21: qty 2

## 2021-08-21 MED ORDER — INSULIN ASPART 100 UNIT/ML IJ SOLN
0.0000 [IU] | Freq: Three times a day (TID) | INTRAMUSCULAR | Status: DC
  Administered 2021-08-22 – 2021-08-23 (×4): 1 [IU] via SUBCUTANEOUS
  Filled 2021-08-21: qty 0.09

## 2021-08-21 MED ORDER — AMLODIPINE BESYLATE 5 MG PO TABS
5.0000 mg | ORAL_TABLET | Freq: Every day | ORAL | Status: DC
  Administered 2021-08-23: 5 mg via ORAL
  Filled 2021-08-21: qty 1

## 2021-08-21 MED ORDER — SODIUM CHLORIDE (PF) 0.9 % IJ SOLN
INTRAMUSCULAR | Status: AC
  Administered 2021-08-21: 3 mL
  Filled 2021-08-21: qty 50

## 2021-08-21 MED ORDER — OXYCODONE-ACETAMINOPHEN 5-325 MG PO TABS
1.0000 | ORAL_TABLET | Freq: Once | ORAL | Status: AC
  Administered 2021-08-21: 1 via ORAL
  Filled 2021-08-21: qty 1

## 2021-08-21 MED ORDER — TRAZODONE HCL 50 MG PO TABS
150.0000 mg | ORAL_TABLET | Freq: Every day | ORAL | Status: DC
  Administered 2021-08-21 – 2021-08-22 (×2): 150 mg via ORAL
  Filled 2021-08-21 (×2): qty 1

## 2021-08-21 MED ORDER — ONDANSETRON HCL 4 MG PO TABS: 4.0000 mg | ORAL_TABLET | Freq: Four times a day (QID) | ORAL | Status: DC | PRN

## 2021-08-21 MED ORDER — OXYCODONE HCL 5 MG PO TABS
5.0000 mg | ORAL_TABLET | Freq: Four times a day (QID) | ORAL | Status: DC | PRN
  Administered 2021-08-21 – 2021-08-23 (×6): 5 mg via ORAL
  Filled 2021-08-21 (×6): qty 1

## 2021-08-21 MED ORDER — ACETAMINOPHEN 650 MG RE SUPP: 650.0000 mg | Freq: Four times a day (QID) | RECTAL | Status: DC | PRN

## 2021-08-21 MED ORDER — FENTANYL CITRATE PF 50 MCG/ML IJ SOSY
25.0000 ug | PREFILLED_SYRINGE | INTRAMUSCULAR | Status: DC | PRN
  Administered 2021-08-22: 25 ug via INTRAVENOUS
  Filled 2021-08-21: qty 1

## 2021-08-21 NOTE — Assessment & Plan Note (Addendum)
BP within acceptable range.  Not on medications. ?

## 2021-08-21 NOTE — Assessment & Plan Note (Signed)
See abdominal pain above ?

## 2021-08-21 NOTE — Assessment & Plan Note (Addendum)
Patient with history of interstitial cystitis.  Has dysuria, frequency and urgency for 9 days.  Reportedly treated with Bactrim and transition to p.o. Cipro 2 days prior to presentation.  UA with trace LE and positive nitrite but no bacteria.  Urine culture with insignificant growth. ?-Completed 3 days of IV ceftriaxone in house ?-Recommend outpatient follow-up with a urologist for interstitial cystitis ?

## 2021-08-21 NOTE — Assessment & Plan Note (Addendum)
Rate controlled without medication.  On Eliquis.  Last dose the morning of admission. ?-Continue home Eliquis. ?

## 2021-08-21 NOTE — Assessment & Plan Note (Addendum)
Followed by Dr. Amalia Hailey at [facility] ?-Recommend outpatient follow-up with a urologist as soon as possible ?

## 2021-08-21 NOTE — Assessment & Plan Note (Addendum)
CT A/P raises concern for rectal inflammation/infection/neoplasm.  Patient reports numerous polyps on recent colonoscopy. ?-See abdominal pain ?

## 2021-08-21 NOTE — Assessment & Plan Note (Addendum)
Had ORIF previous hospitalization.  Surgical wound healing. ?Outpatient follow-up with orthopedic surgery as previously planned ?

## 2021-08-21 NOTE — Assessment & Plan Note (Addendum)
Likely from constipation and possible UTI. CT A/P with asymmetric thickening through the broad portion of the distal left rectum concerning for inflammation/infection/malignancy. CA 19-9 within normal.  CEA elevated to 66.  Mild elevated ESR but CRP within normal.  Patient reports numerous polyps on recent colonoscopy.  Patient's symptoms improved after bowel purge with MiraLAX.  Also completed 3 days of IV ceftriaxone for possible UTI.  Cleared for discharge by GI for outpatient follow-up in 2 months for colonoscopy. ?-Bowel regimen with as needed MiraLAX, Senokot-S and Fleet enema based on severity ?

## 2021-08-21 NOTE — H&P (Signed)
?History and Physical  ? ? ?Patient: Yolanda Franco:[id number] DOB: 13-Jun-1932 ?DOA: 08/21/2021 ?DOS: the patient was seen and examined on 08/21/2021 ?PCP: [name], MD  ?Patient coming from: SNF ? ?Chief Complaint:  ?Chief Complaint  ?Patient presents with  ? Abdominal Pain  ? Dysuria  ? ?HPI:  ?88 year old F with PMH of colon polyps, interstitial cystitis, IDDM-2, paroxysmal A-fib on Eliquis, anxiety, depression, hypothyroidism, HTN and recent hospitalization for right hip fracture s/p repair presenting with dysuria, abdominal pain, frequent urination and urgency for about 9 days. ? ?Patient reports worsening low abdominal pain and UTI symptoms for 9 days.  Reportedly started on Bactrim and transition to Cipro 2 days prior to presentation.  She describes the pain as sore and feels like a wound.  Pain was severe but improved with pain medication she was given at rehab.  She denies any back pain or radiation.  No provoking factor but reports dysuria with urination.  She also reports frequency and urgency.  Her urine is somewhat red.  She denies fever or chills.  She reports nausea but no emesis.  She denies diarrhea or constipation.  She says she had a small bowel movement yesterday.  She denies blood in the stool.  She denies URI symptoms, chest pain, dyspnea or focal neuro symptoms other than some chronic numbness and weakness in left leg.  ? ?She is coming from [place].  She denies smoking cigarette, drinking alcohol or recreational drug use.  She prefers to remain full code. ? ?In ED, vitals stable.  CBC and CMP without significant finding.  UA with trace LE and positive nitrite.  CT abdomen and pelvis with asymmetric thickening through the improved portion of the distal left rectum and several prominent lymph nodes in the mesorectum with fat stranding concerning for possible inflammation/infection/neoplasm.  Urine culture obtained.  GI consulted.  Hospitalist service called for admission.   ?Review of Systems: As mentioned in the history of present illness. All other systems reviewed and are negative. ?Past Medical History:  ?Diagnosis Date  ? Anxiety   ? Depression   ? Dizzy spells 10/27/2012  ? Heart disease   ? leaking valve  ? Migraine   ? Shaking spells 10/27/2012  ? Weakness 10/27/2012  ? ?Past Surgical History:  ?Procedure Laterality Date  ? [place], 2002  ? BUNIONECTOMY Bilateral 1986  ? HIP ARTHROPLASTY Right 08/05/2021  ? Procedure: ARTHROPLASTY BIPOLAR HIP (HEMIARTHROPLASTY);  Surgeon: Mordecai Rasmussen, MD;  Location: [facility];  Service: Orthopedics;  Laterality: Right;  ? KNEE SURGERY Right 1991  ? Cartilage  ? NOSE SURGERY  mid 70's  ? VAGINAL HYSTERECTOMY  1980  ? ?Social History:  reports that she has never smoked. She has never used smokeless tobacco. She reports that she does not drink alcohol and does not use drugs. ? ?Allergies  ?Allergen Reactions  ? Aspirin Other (See Comments)  ?  migraine  ? Codeine   ?  Causes Migraines  ? Eggs Or Egg-Derived Products   ?  Causes Migraine  ? Poultry Meal Other (See Comments)  ?  Causes Migraine ? ?  ? ? ?Family History  ?Problem Relation Age of Onset  ? Stroke Mother   ? Other Father   ?     surgery complications  ? Migraines Other   ? Arthritis Other   ? ? ?Prior to Admission medications   ?Medication Sig Start Date End Date Taking? Authorizing Provider  ?acetaminophen (TYLENOL)  325 MG tablet Take 2 tablets (650 mg total) by mouth every 6 (six) hours as needed for mild pain. ?Patient taking differently: Take 650 mg by mouth 2 (two) times daily. 08/07/21 09/06/21 Yes Shahmehdi, Seyed A, MD  ?amLODipine (NORVASC) 5 MG tablet Take 5 mg by mouth daily.   Yes [provider]  ?apixaban (ELIQUIS) 2.5 MG TABS tablet Take 2.5 mg by mouth 2 (two) times daily.   Yes [provider]  ?Azelastine HCl 137 MCG/SPRAY SOLN Place 1 spray into the nose 2 (two) times daily.   Yes [provider]  ?ciprofloxacin (CIPRO) 250 MG tablet  Take 250 mg by mouth 2 (two) times daily. 08/19/21 08/22/21 Yes [provider]  ?ferrous gluconate (FERGON) 324 MG tablet Take 324 mg by mouth every Monday, Wednesday, and Friday.   Yes [provider]  ?FLUoxetine (PROZAC) 40 MG capsule Take 40 mg by mouth daily.   Yes [provider]  ?fluticasone (FLONASE) 50 MCG/ACT nasal spray Place 1 spray into both nostrils daily.   Yes [provider]  ?Glucosamine 500 MG CAPS Take 500 mg by mouth every evening. 01/08/19  Yes [provider]  ?guaiFENesin (MUCINEX) 600 MG 12 hr tablet Take 600 mg by mouth 2 (two) times daily.   Yes [provider]  ?HYDROcodone-acetaminophen (NORCO/VICODIN) 5-325 MG tablet Take 1 tablet by mouth every 6 (six) hours as needed for moderate pain.   Yes [provider]  ?HYDROCORTISONE ACE, RECTAL, 30 MG SUPP Place 30 mg rectally 2 (two) times daily.   Yes [provider]  ?insulin detemir (LEVEMIR) 100 UNIT/ML injection Inject 10 Units into the skin at bedtime.   Yes [provider]  ?levothyroxine (SYNTHROID, LEVOTHROID) 25 MCG tablet Take 12.5 mcg by mouth daily before breakfast. 08/23/12  Yes [provider]  ?lidocaine (LMX) 4 % cream Apply 1 application. topically 2 (two) times daily. Coat proctocort suppository thoroughly prior to placement.   Yes [provider]  ?loratadine (CLARITIN) 10 MG tablet Take 10 mg by mouth daily.   Yes [provider]  ?mirtazapine (REMERON) 15 MG tablet Take 15 mg by mouth at bedtime.   Yes [provider]  ?Multiple Vitamin (MULTIVITAMIN WITH MINERALS) TABS tablet Take 1 tablet by mouth daily. 08/07/21  Yes Shahmehdi, Valeria Batman, MD  ?phenazopyridine (PYRIDIUM) 100 MG tablet Take 100 mg by mouth 3 (three) times daily as needed for pain (dysuria or bladder spasms).   Yes [provider]  ?traZODone (DESYREL) 150 MG tablet Take 150 mg by mouth at bedtime.   Yes [provider]   ?feeding supplement (ENSURE ENLIVE / ENSURE PLUS) LIQD Take 237 mLs by mouth 2 (two) times daily between meals. ?Patient not taking: Reported on 08/21/2021 08/07/21   Deatra James, MD  ?polyethylene glycol (MIRALAX / GLYCOLAX) 17 g packet Take 17 g by mouth daily. ?Patient not taking: Reported on 08/21/2021 08/08/21   Deatra James, MD  ?senna-docusate (SENOKOT-S) 8.6-50 MG tablet Take 1 tablet by mouth 2 (two) times daily. ?Patient not taking: Reported on 08/21/2021 08/07/21 09/06/21  Deatra James, MD  ? ? ?Physical Exam: ?Vitals:  ? 08/21/21 1445 08/21/21 1530 08/21/21 1630 08/21/21 1700  ?BP: 140/70 (!) 164/67 (!) 150/67 140/65  ?Pulse: (!) 59 62 67 66  ?Resp: 19 17 18 15   ?Temp:    98.4 ?F (36.9 ?C)  ?TempSrc:    Oral  ?SpO2: 98% 96% 96% 97%  ? ?GENERAL: No apparent  distress.  Nontoxic. ?HEENT: MMM.  Vision and hearing grossly intact.  ?NECK: Supple.  No apparent JVD.  ?RESP:  No IWOB.  Fair aeration bilaterally. ?CVS: Irregular rhythm.  Normal rate.  Heart sounds normal.  ?ABD/GI/GU: BS+. Abd soft.  Mild suprapubic tenderness.  No rebound or guarding. ?MSK/EXT:  Moves extremities.  Chronic left foot deformity.  ?SKIN: Steri-Strip over right hip.  Surgical wound over right hip pain. ?NEURO: Awake and alert. Oriented appropriately. RLE weakness with hip flexion from recent hip fracture ?PSYCH: Calm. Normal affect.  ? ?Data Reviewed: ?See HPI ? ?Assessment and Plan: ?* Abdominal pain ?Mild diffuse tenderness mainly over suprapubic area.  No rebound or guarding.  Concern about possible UTI.  CT abdomen and pelvis with asymmetric thickening through the broad portion of the distal left rectum concerning for malignancy.  Patient reports numerous colon polyps on recent colonoscopy.  She was to have surgery which was postponed due to recent right hip fracture.  ?-IV ceftriaxone for possible UTI pending urine culture ?-GI consulted for possible rectal neoplasm ?-She may need general surgery consult after GI  eval. ?-Check CRP, ESR, CA 19-9 and CEA. ?-Pain control with as needed Tylenol, oxycodone and fentanyl ? ?Abnormal computed tomography angiography (CTA) of abdomen and pelvis ?CT A/P raises concern for rectal inflamm

## 2021-08-21 NOTE — Assessment & Plan Note (Addendum)
A1c 5.2% 3 weeks prior. ?Recent Labs  ?Lab 08/22/21 ?1156 08/22/21 ?1637 08/22/21 ?2115 08/23/21 ?0731 08/23/21 ?1132  ?GLUCAP 125* 143* 143* 124* 108*  ?-Continue home insulin ?-Liberate diet ?

## 2021-08-21 NOTE — ED Provider Notes (Signed)
?[facility] DEPT ?Provider Note ? ? ?CSN: [id number] ?Arrival date & time: 08/21/21  1144 ? ?  ? ?History ? ?Chief Complaint  ?Patient presents with  ? Abdominal Pain  ? Dysuria  ? ? ?Yolanda Franco is a 88 y.o. female. ? ?88 year old female with past medical history of interstitial cystitis, proctitis, recent hip fracture status post surgery presents today for evaluation of lower abdominal pain, dysuria of 1.5-week duration.  She denies fever, chills, constipation, hematuria, nausea, vomiting.  She states her pain is quite significant and she has been getting something for pain that provides her with short-term relief.  She had a dose of her pain medication prior to transport.  Currently she denies pain.  She denies chest pain, or shortness of breath.  ? ?Spoke with nurse from rehab facility who mention patient was initially started on Bactrim and then transition to Cipro 2 days ago.  He does not have initial UA or urine culture to reference.  States they were concerned about worsening abdominal pain.  Denies any documented fever.  In addition to the antibiotics she also received Pyridium with minimal improvement. ? ?The history is provided by the patient. No language interpreter was used.  ? ?  ? ?Home Medications ?Prior to Admission medications   ?Medication Sig Start Date End Date Taking? Authorizing Provider  ?acetaminophen (TYLENOL) 325 MG tablet Take 2 tablets (650 mg total) by mouth every 6 (six) hours as needed for mild pain. 08/07/21 09/06/21  ShahmehdiValeria Batman, MD  ?amLODipine (NORVASC) 5 MG tablet Take 5 mg by mouth daily.    [provider]  ?apixaban (ELIQUIS) 2.5 MG TABS tablet Take by mouth 2 (two) times daily.    [provider]  ?feeding supplement (ENSURE ENLIVE / ENSURE PLUS) LIQD Take 237 mLs by mouth 2 (two) times daily between meals. 08/07/21   Shahmehdi, Valeria Batman, MD  ?FLUoxetine (PROZAC) 40 MG capsule Take 40 mg by mouth daily.    [provider]  ?Glucosamine 500 MG CAPS Take by mouth. 01/08/19   [provider]  ?insulin detemir (LEVEMIR) 100 UNIT/ML injection Inject 10 Units into the skin daily.    [provider]  ?levothyroxine (SYNTHROID, LEVOTHROID) 25 MCG tablet Take 12 mcg by mouth daily before breakfast. 1/2 tab qd 08/23/12   [provider]  ?mirtazapine (REMERON) 15 MG tablet Take 15 mg by mouth at bedtime.    [provider]  ?Multiple Vitamin (MULTIVITAMIN WITH MINERALS) TABS tablet Take 1 tablet by mouth daily. 08/07/21   Shahmehdi, Valeria Batman, MD  ?polyethylene glycol (MIRALAX / GLYCOLAX) 17 g packet Take 17 g by mouth daily. 08/08/21   Shahmehdi, Valeria Batman, MD  ?senna-docusate (SENOKOT-S) 8.6-50 MG tablet Take 1 tablet by mouth 2 (two) times daily. 08/07/21 09/06/21  ShahmehdiValeria Batman, MD  ?traZODone (DESYREL) 150 MG tablet Take 150 mg by mouth at bedtime.    [provider]  ?   ? ?Allergies    ?Aspirin, Codeine, and Eggs or egg-derived products   ? ?Review of Systems   ?Review of Systems  ?Respiratory:  Negative for shortness of breath.   ?All other systems reviewed and are negative. ? ?Physical Exam ?Updated Vital Signs ?BP 139/83   Pulse 68   Temp 98.3 ?F (36.8 ?C) (Oral)   Resp 15   SpO2 98%  ?Physical Exam ?Vitals and nursing note reviewed.  ?Constitutional:   ?   General: She is not in acute  distress. ?   Appearance: Normal appearance. She is not ill-appearing.  ?HENT:  ?   Head: Normocephalic and atraumatic.  ?   Nose: Nose normal.  ?Eyes:  ?   General: No scleral icterus. ?   Extraocular Movements: Extraocular movements intact.  ?   Conjunctiva/sclera: Conjunctivae normal.  ?Cardiovascular:  ?   Rate and Rhythm: Normal rate and regular rhythm.  ?   Pulses: Normal pulses.  ?   Heart sounds: Normal heart sounds.  ?Pulmonary:  ?   Effort: Pulmonary effort is normal. No respiratory distress.  ?   Breath sounds: Normal breath sounds. No wheezing or rales.  ?Abdominal:  ?   General: There  is no distension.  ?   Tenderness: There is abdominal tenderness. There is no right CVA tenderness, left CVA tenderness, guarding or rebound.  ?Musculoskeletal:     ?   General: Normal range of motion.  ?   Cervical back: Normal range of motion.  ?Skin: ?   General: Skin is warm and dry.  ?Neurological:  ?   General: No focal deficit present.  ?   Mental Status: She is alert. Mental status is at baseline.  ? ? ?ED Results / Procedures / Treatments   ?Labs ?(all labs ordered are listed, but only abnormal results are displayed) ?Labs Reviewed  ?CBC WITH DIFFERENTIAL/PLATELET  ?COMPREHENSIVE METABOLIC PANEL  ?LIPASE, BLOOD  ?URINALYSIS, ROUTINE W REFLEX MICROSCOPIC  ? ? ?EKG ?None ? ?Radiology ?No results found. ? ?Procedures ?Procedures  ? ? ?Medications Ordered in ED ?Medications - No data to display ? ?ED Course/ Medical Decision Making/ A&P ?  ?                        ?Medical Decision Making ?Amount and/or Complexity of Data Reviewed ?Labs: ordered. ?Radiology: ordered. ? ?Risk ?Prescription drug management. ?Decision regarding hospitalization. ? ? ?Medical Decision Making / ED Course ? ? ?This patient presents to the ED for concern of abdominal pain, dysuria, this involves an extensive number of treatment options, and is a complaint that carries with it a high risk of complications and morbidity.  The differential diagnosis includes appendicitis, cholecystitis, UTI, pyelonephritis, small bowel obstruction, pancreatitis, diverticulitis ? ?MDM: ?88 year old female presents today for evaluation of abdominal pain of right lower quadrant, suprapubic, left lower quadrant, dysuria without associated constipation, nausea, vomiting, constipation.  Symptoms have been ongoing for 1.5 weeks.  She has been treated with multiple antibiotics for UTI over the same timeframe without improvement in symptoms.  Will evaluate with CBC, CMP, lipase, UA, CT abdomen and pelvis with contrast. ?CBC without leukocytosis.  Hemoglobin 11.5  which is slightly above her baseline.  CMP with glucose 116 otherwise unremarkable.  Lipase within normal limits.  UA with nitrite positive and trace leukocytes however she is status post Pyridium so this could be a false positive.  We will send urine culture.  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  Patient did have a colonoscopy recently where she states she was told she had "over 100 polyps that needed to be removed".  She states she might need to have portion of her bowel removed as well.  I did speak with her gastroenterologist office and requested the records to be faxed over to [place].  I was unable to speak with the nurse however I was told  I would get a call back.  I did not get a call back and I called back to their clinic 30 minutes later and [facility] was closed.  Will discuss with on-call gastroenterologist. ?Discussed with on-call gastroenterology who recommends patient be admitted for further work-up and management of the rectal mass.  Case discussed with hospitalist who will evaluate patient for admission. ? ? ?Additional history obtained: ?-Additional history obtained from recent admission on 2/25 where patient had operative repair of her femoral neck fracture.  Patient was discharged 3/1.  Eliquis was resumed on 3/1.  Patient is on Eliquis for paroxysmal A-fib. ?-External records from outside source obtained and reviewed including: Chart review including previous notes, labs, imaging, consultation notes ? ? ?Lab Tests: ?-I ordered, reviewed, and interpreted labs.   ?The pertinent results include:   ?Labs Reviewed  ?CBC WITH DIFFERENTIAL/PLATELET  ?COMPREHENSIVE METABOLIC PANEL  ?LIPASE, BLOOD  ?URINALYSIS, ROUTINE W REFLEX MICROSCOPIC  ?  ? ? ?EKG ? EKG Interpretation ? ?Date/Time:    ?Ventricular Rate:    ?PR Interval:    ?QRS Duration:   ?QT  Interval:    ?QTC Calculation:   ?R Axis:     ?Text Interpretation:   ?  ? ?  ? ? ? ?Imaging Studies ordered: ?I ordered imaging studies including CT abdomen and pelvis ?I independently visualized and interpreted imaging.

## 2021-08-21 NOTE — ED Triage Notes (Signed)
BIBA ?Per EMS: Pt coming from [facility] w/ c/o UTI x 1 week. Pt dx 1 week ago not getting better. C/o Pain in lower abd & Painful urination  ?142/74BP ?64HR ?95% ?127 CBG ?98.1 Temp  ?

## 2021-08-21 NOTE — Hospital Course (Addendum)
88 year old F with PMH of colon polyps, interstitial cystitis, IDDM-2, paroxysmal A-fib on Eliquis, anxiety, depression, hypothyroidism, HTN, colonic polyps and recent hospitalization for right hip fracture s/p repair presenting with dysuria, abdominal pain, frequent urination and urgency for about 9 days.  Treated with Bactrim and transitioned to p.o. Cipro 2 days prior to her arrival.  CT abdomen and pelvis with asymmetric thickening through a broad portion of the distal left rectum and several prominent lymph nodes in the mesorectum with fat stranding concerning for possible inflammation/infection/neoplasm.  Urine culture obtained.  Started on IV ceftriaxone for possible UTI.  [facility] consulted.  Hospitalist service called for admission. ? ?Evaluated by GI and she had successful bowel purge with MiraLAX with improvement in her symptoms.  Cleared for discharge by GI for outpatient colonoscopy in 2 months once he fracture heals completely.  She is discharged on MiraLAX, Senokot-S and Fleet enema as needed based on severity of constipation.  She is off opiate as well. ? ?In regards to possible UTI, urine culture with insignificant growth.  She completed 3 days of IV ceftriaxone.  Recommend outpatient follow-up with a urologist for interstitial cystitis. ?

## 2021-08-21 NOTE — Assessment & Plan Note (Addendum)
-   Continue home Synthroid °

## 2021-08-21 NOTE — Assessment & Plan Note (Addendum)
Stable.   ?-Continue home medication after med rec ?

## 2021-08-22 DIAGNOSIS — N3010 Interstitial cystitis (chronic) without hematuria: Secondary | ICD-10-CM | POA: Diagnosis not present

## 2021-08-22 DIAGNOSIS — E119 Type 2 diabetes mellitus without complications: Secondary | ICD-10-CM | POA: Diagnosis not present

## 2021-08-22 DIAGNOSIS — Z794 Long term (current) use of insulin: Secondary | ICD-10-CM | POA: Diagnosis not present

## 2021-08-22 DIAGNOSIS — R935 Abnormal findings on diagnostic imaging of other abdominal regions, including retroperitoneum: Secondary | ICD-10-CM | POA: Diagnosis not present

## 2021-08-22 DIAGNOSIS — K5900 Constipation, unspecified: Secondary | ICD-10-CM

## 2021-08-22 DIAGNOSIS — I480 Paroxysmal atrial fibrillation: Secondary | ICD-10-CM | POA: Diagnosis not present

## 2021-08-22 DIAGNOSIS — F419 Anxiety disorder, unspecified: Secondary | ICD-10-CM | POA: Diagnosis not present

## 2021-08-22 DIAGNOSIS — K635 Polyp of colon: Secondary | ICD-10-CM | POA: Diagnosis not present

## 2021-08-22 DIAGNOSIS — E039 Hypothyroidism, unspecified: Secondary | ICD-10-CM | POA: Diagnosis not present

## 2021-08-22 DIAGNOSIS — F32A Depression, unspecified: Secondary | ICD-10-CM | POA: Diagnosis not present

## 2021-08-22 DIAGNOSIS — D122 Benign neoplasm of ascending colon: Secondary | ICD-10-CM | POA: Diagnosis not present

## 2021-08-22 DIAGNOSIS — R1030 Lower abdominal pain, unspecified: Secondary | ICD-10-CM | POA: Diagnosis not present

## 2021-08-22 DIAGNOSIS — S72001A Fracture of unspecified part of neck of right femur, initial encounter for closed fracture: Secondary | ICD-10-CM | POA: Diagnosis not present

## 2021-08-22 DIAGNOSIS — N3001 Acute cystitis with hematuria: Secondary | ICD-10-CM | POA: Diagnosis not present

## 2021-08-22 DIAGNOSIS — K5903 Drug induced constipation: Secondary | ICD-10-CM | POA: Diagnosis not present

## 2021-08-22 LAB — CANCER ANTIGEN 19-9: CA 19-9: 23 U/mL (ref 0–35)

## 2021-08-22 LAB — MAGNESIUM: Magnesium: 2.1 mg/dL (ref 1.7–2.4)

## 2021-08-22 LAB — RENAL FUNCTION PANEL
Albumin: 2.8 g/dL — ABNORMAL LOW (ref 3.5–5.0)
Anion gap: 5 (ref 5–15)
BUN: 10 mg/dL (ref 8–23)
CO2: 24 mmol/L (ref 22–32)
Calcium: 8.4 mg/dL — ABNORMAL LOW (ref 8.9–10.3)
Chloride: 107 mmol/L (ref 98–111)
Creatinine, Ser: 0.49 mg/dL (ref 0.44–1.00)
GFR, Estimated: >60 mL/min (ref >60)
Glucose, Bld: 132 mg/dL — ABNORMAL HIGH (ref 70–99)
Phosphorus: 3 mg/dL (ref 2.5–4.6)
Potassium: 4 mmol/L (ref 3.5–5.1)
Sodium: 136 mmol/L (ref 135–145)

## 2021-08-22 LAB — CBC
HCT: 32 % — ABNORMAL LOW (ref 36.0–46.0)
Hemoglobin: 10.3 g/dL — ABNORMAL LOW (ref 12.0–15.0)
MCH: 29.9 pg (ref 26.0–34.0)
MCHC: 32.2 g/dL (ref 30.0–36.0)
MCV: 92.8 fL (ref 80.0–100.0)
Platelets: 408 10*3/uL — ABNORMAL HIGH (ref 150–400)
RBC: 3.45 MIL/uL — ABNORMAL LOW (ref 3.87–5.11)
RDW: 14.7 % (ref 11.5–15.5)
WBC: 8 10*3/uL (ref 4.0–10.5)
nRBC: 0 % (ref 0.0–0.2)

## 2021-08-22 LAB — GLUCOSE, CAPILLARY
Glucose-Capillary: 127 mg/dL — ABNORMAL HIGH (ref 70–99)
Glucose-Capillary: 125 mg/dL — ABNORMAL HIGH (ref 70–99)
Glucose-Capillary: 143 mg/dL — ABNORMAL HIGH (ref 70–99)
Glucose-Capillary: 143 mg/dL — ABNORMAL HIGH (ref 70–99)

## 2021-08-22 LAB — CEA: CEA: 66.1 ng/mL — ABNORMAL HIGH (ref 0.0–4.7)

## 2021-08-22 MED ORDER — FLEET ENEMA 7-19 GM/118ML RE ENEM
1.0000 | ENEMA | Freq: Two times a day (BID) | RECTAL | Status: AC
Start: 2021-08-22 — End: 2021-08-23
  Administered 2021-08-22: 1 via RECTAL
  Filled 2021-08-22 (×2): qty 1

## 2021-08-22 MED ORDER — POLYETHYLENE GLYCOL 3350 17 GM/SCOOP PO POWD
1.0000 | Freq: Once | ORAL | Status: AC
  Administered 2021-08-22: 255 g via ORAL
  Filled 2021-08-22: qty 255

## 2021-08-22 NOTE — Evaluation (Signed)
Occupational Therapy Evaluation ?Patient Details ?Name: Yolanda Franco ?MRN: [id number] ?DOB: 29-Jun-1932 ?Today's Date: 08/22/2021 ? ? ?History of Present Illness Patient is an 88 year old female admitted from SNF with dysuria, abdominal pain, frequent urination and urgency for about 9 days. Recent hip fracture 2/25 WBAT with posterior hip precautions. PMH:  colon polyps, interstitial cystitis, IDDM-2, paroxysmal A-fib on Eliquis, anxiety, depression, hypothyroidism, HTN  ? ?Clinical Impression ?  ?Patient from rehab 2* painful urination, D/Cd to rehab after R hip fracture. Patient states she was able to transfer to wheelchair with therapy, was not walking yet. OT working on dressing with lower body AE. Currently patient min G for bed mobility and min A for stand pivot transfer first to bedside commode then to recliner chair. Patient is unsteady with high fall risk when not supporting self bilaterally on rolling walker. Patient attempt to assist with clothing management in standing however became very unsteady needing total A to complete safely. Patient agreeable to returning to rehab for further therapy before D/C home. Acute OT to follow.   ?   ? ?Recommendations for follow up therapy are one component of a multi-disciplinary discharge planning process, led by the attending physician.  Recommendations may be updated based on patient status, additional functional criteria and insurance authorization.  ? ?Follow Up Recommendations ? Skilled nursing-short term rehab (<3 hours/day)  ?  ?Assistance Recommended at Discharge Frequent or constant Supervision/Assistance  ?Patient can return home with the following A little help with walking and/or transfers;A lot of help with bathing/dressing/bathroom;Assistance with cooking/housework;Help with stairs or ramp for entrance;Assist for transportation ? ?  ?Functional Status Assessment ? Patient has had a recent decline in their functional status and demonstrates the ability  to make significant improvements in function in a reasonable and predictable amount of time.  ?Equipment Recommendations ? None recommended by OT  ?  ?   ?Precautions / Restrictions Precautions ?Precautions: Fall;Posterior Hip ?Restrictions ?RLE Weight Bearing: Weight bearing as tolerated  ? ?  ? ?Mobility Bed Mobility ?Overal bed mobility: Needs Assistance ?Bed Mobility: Supine to Sit ?  ?  ?Supine to sit: Min guard ?  ?  ?General bed mobility comments: Min G for safety ?  ? ? ? ?  ?Balance Overall balance assessment: Needs assistance ?Sitting-balance support: Feet supported, No upper extremity supported ?Sitting balance-Leahy Scale: Fair ?  ?  ?Standing balance support: Reliant on assistive device for balance, During functional activity ?Standing balance-Leahy Scale: Poor ?Standing balance comment: using RW ?  ?  ?  ?  ?  ?  ?  ?  ?  ?  ?  ?   ? ?ADL either performed or assessed with clinical judgement  ? ?ADL Overall ADL's : Needs assistance/impaired ?  ?  ?Grooming: Set up;Sitting ?  ?Upper Body Bathing: Set up;Sitting ?  ?Lower Body Bathing: Total assistance;Sitting/lateral leans;Sit to/from stand ?Lower Body Bathing Details (indicate cue type and reason): 2* hip precautions ?Upper Body Dressing : Set up;Sitting ?  ?Lower Body Dressing: Total assistance;Sit to/from stand;Sitting/lateral leans ?Lower Body Dressing Details (indicate cue type and reason): To don socks, doff PJ bottoms and don mesh underwear 2* hip precautions. Patient reports has been working with therapy on dressing with AE ?Toilet Transfer: Minimal assistance;Stand-pivot;Cueing for sequencing;Cueing for safety;BSC/3in1;Rolling walker (2 wheels) ?Toilet Transfer Details (indicate cue type and reason): Patient unsteady needing min A throughout transfer and cues not to sit too early-need to be lined up with commode. Cues to push from surface  when standing vs pulling on walker ?Toileting- Clothing Manipulation and Hygiene: Total  assistance;Sitting/lateral lean;Sit to/from stand ?Toileting - Clothing Manipulation Details (indicate cue type and reason): Patient attempts to assist pulling up clothing but has difficulty maintaining balance ?  ?  ?Functional mobility during ADLs: Minimal assistance;Cueing for safety;Cueing for sequencing;Rolling walker (2 wheels) ?   ? ? ? ? ?Pertinent Vitals/Pain Pain Assessment ?Pain Assessment: Faces ?Faces Pain Scale: Hurts even more ?Pain Location: abdomen/bladder ?Pain Descriptors / Indicators: Burning, Pressure ?Pain Intervention(s): Monitored during session  ? ? ? ?Hand Dominance Right ?  ?Extremity/Trunk Assessment Upper Extremity Assessment ?RUE Deficits / Details: hx of shoulder tear with limited strength ?  ?Lower Extremity Assessment ?Lower Extremity Assessment: Defer to PT evaluation ?  ?  ?  ?Communication Communication ?Communication: No difficulties ?  ?Cognition Arousal/Alertness: Awake/alert ?Behavior During Therapy: [facility] for tasks assessed/performed ?Overall Cognitive Status: Within Functional Limits for tasks assessed ?  ?  ?  ?  ?  ?  ?  ?  ?  ?  ?  ?  ?  ?  ?  ?  ?  ?  ?  ?   ?   ?   ? ? ?Home Living Family/patient expects to be discharged to:: Private residence ?Living Arrangements: Other relatives ?Available Help at Discharge: Family;Available PRN/intermittently ?Type of Home: House ?Home Access: Stairs to enter ?Entrance Stairs-Number of Steps: 2 ?Entrance Stairs-Rails: None ?Home Layout: One level ?  ?  ?Bathroom Shower/Tub: Tub/shower unit ?  ?Bathroom Toilet: Standard ?Bathroom Accessibility: Yes ?How Accessible: Accessible via walker ?Home Equipment: Conservation officer, nature (2 wheels);Rollator (4 wheels);Other (comment);BSC/3in1;Grab bars - toilet;Grab bars - tub/shower;Shower seat ?  ?  ?  ? ?  ?Prior Functioning/Environment Prior Level of Function : Needs assist ?  ?  ?  ?  ?  ?  ?Mobility Comments: Pt reportedly can transfer within home using RW without assist. Assist needed in community  using stairs with pt reporting rollator use when out of house. Pt reportedly drives. ?ADLs Comments: Pt's niece assists with bathing. Pt reported independence with all other ADL's. Niece assists with IADL's. ?  ? ?  ?  ?OT Problem List: Pain;Decreased activity tolerance;Decreased safety awareness ?  ?   ?OT Treatment/Interventions: Self-care/ADL training;Therapeutic exercise;Therapeutic activities;Balance training;Patient/family education;DME and/or AE instruction  ?  ?OT Goals(Current goals can be found in the care plan section) Acute Rehab OT Goals ?Patient Stated Goal: return to rehab ?OT Goal Formulation: With patient ?Time For Goal Achievement: 09/05/21 ?Potential to Achieve Goals: Good  ?OT Frequency: Min 2X/week ?  ? ?   ?AM-PAC OT "6 Clicks" Daily Activity     ?Outcome Measure Help from another person eating meals?: None ?Help from another person taking care of personal grooming?: A Little ?Help from another person toileting, which includes using toliet, bedpan, or urinal?: A Lot ?Help from another person bathing (including washing, rinsing, drying)?: A Lot ?Help from another person to put on and taking off regular upper body clothing?: A Little ?Help from another person to put on and taking off regular lower body clothing?: Total ?6 Click Score: 15 ?  ?End of Session Equipment Utilized During Treatment: Rolling walker (2 wheels) ?Nurse Communication: Mobility status ? ?Activity Tolerance: Patient tolerated treatment well ?Patient left: in chair;with call bell/phone within reach;with chair alarm set ? ?OT Visit Diagnosis: Unsteadiness on feet (R26.81)  ?              ?Time: 4665-9935 ?OT Time Calculation (min):  25 min ?Charges:  OT General Charges ?$OT Visit: 1 Visit ?OT Evaluation ?$OT Eval Low Complexity: 1 Low ?OT Treatments ?$Self Care/Home Management : 8-22 mins ? ?Delbert Phenix OT ?OT pager: [phone] ? ?Rosemary Holms ?08/22/2021, 11:42 AM ?

## 2021-08-22 NOTE — Care Management Obs Status (Signed)
MEDICARE OBSERVATION STATUS NOTIFICATION ? ? ?Patient Details  ?Name: Yolanda Franco ?MRN: [id number] ?Date of Birth: 07/02/1932 ? ? ?Medicare Observation Status Notification Given:  Yes ? ? ? ?Denielle Bayard, LCSW ?08/22/2021, 4:19 PM ?

## 2021-08-22 NOTE — TOC Initial Note (Signed)
Transition of Care (TOC) - Initial/Assessment Note  ? ? ?Patient Details  ?Name: Yolanda Franco ?MRN: [id number] ?Date of Birth: Jan 13, 1933 ? ?Transition of Care (TOC) CM/SW Contact:    ?Khylen Riolo, LCSW ?Phone Number: ?08/22/2021, 11:02 AM ? ?Clinical Narrative:                 ?Met with pt and son, Yolanda Franco, this morning to introduce self/ TOC role.  Pt confirms that she was admitted here from [facility] where she had recently gone for rehab following hip fx.  Now with re-hospitalization due to infection with work up underway.  Pt and son confirm that discharge plan will be to return to [facility] when she is medically cleared to do so and complete her rehab.  TOC will follow along and begin SNF return process/ ins auth when appropriate. ? ?Expected Discharge Plan: [place] ?Barriers to Discharge: Continued Medical Work up, Ship broker ? ? ?Patient Goals and CMS Choice ?Patient states their goals for this hospitalization and ongoing recovery are:: anticipates returning to SNF to complete rehab once medically cleared for this; long term plan to return home after rehab ?  ?  ? ?Expected Discharge Plan and Services ?Expected Discharge Plan: [place] ?In-house Referral: Clinical Social Work ?  ?Post Acute Care Choice: [place] ?Living arrangements for the past 2 months: [place] ?                ?DME Arranged: N/A ?DME Agency: NA ?  ?  ?  ?  ?  ?  ?  ?  ? ?Prior Living Arrangements/Services ?Living arrangements for the past 2 months: [place] ?Lives with:: Relatives (niece) ?Patient language and need for interpreter reviewed:: Yes ?Do you feel safe going back to the place where you live?: Yes      ?Need for Family Participation in Patient Care: No (Comment) ?Care giver support system in place?: Yes (comment) ?  ?Criminal Activity/Legal Involvement Pertinent to Current Situation/Hospitalization: No - Comment as needed ? ?Activities of Daily  Living ?  ?  ? ?Permission Sought/Granted ?Permission sought to share information with : Family Supports, Customer service manager ?Permission granted to share information with : Yes, Verbal Permission Granted ? Share Information with NAME: Yolanda Franco ?   ? Permission granted to share info w Relationship: son ? Permission granted to share info w Contact Information: [phone] ? ?Emotional Assessment ?Appearance:: Appears stated age ?Attitude/Demeanor/Rapport: Gracious ?Affect (typically observed): Frustrated ?Orientation: : Oriented to Self, Oriented to Place, Oriented to  Time, Oriented to Situation ?Alcohol / Substance Use: Not Applicable ?Psych Involvement: No (comment) ? ?Admission diagnosis:  Rectal mass [K62.89] ?Acute cystitis without hematuria [N30.00] ?Abdominal pain [R10.9] ?Patient Active Problem List  ? Diagnosis Date Noted  ? Abdominal pain 08/21/2021  ? UTI (urinary tract infection) 08/21/2021  ? Insulin-requiring or dependent type II diabetes mellitus ([place]) 08/21/2021  ? Interstitial cystitis 08/21/2021  ? Colon polyps 08/21/2021  ? Abnormal computed tomography angiography (CTA) of abdomen and pelvis 08/21/2021  ? Closed displaced fracture of right femoral neck ([place]) 08/03/2021  ? Paroxysmal atrial fibrillation ([place]) 08/03/2021  ? Anxiety and depression 08/03/2021  ? Hypothyroidism 08/03/2021  ? Essential hypertension 08/03/2021  ? Dizzy spells 10/27/2012  ? Shaking spells 10/27/2012  ? Weakness 10/27/2012  ? ?PCP:  [name], MD ?Pharmacy:   ?[place], [place] [address] ?[place] [address] ?Phone: [phone] Fax: [phone] ? ?  [id number] [place] [place] - [address]?[id number]?Phone: [phone]: [phone]? ? ? ? ?Social Determinants of Health (SDOH) Interventions ?  ? ?Readmission Risk Interventions ?Readmission Risk Prevention Plan 08/22/2021  ?Transportation  Screening Complete  ?PCP or Specialist Appt within 5-7 Days Complete  ?Home Care Screening Complete  ?Some recent data might be hidden  ? ? ? ?

## 2021-08-22 NOTE — Consult Note (Addendum)
? ? ? Consultation ? ?Referring Provider: Dr. Cyndia Skeeters     ?Primary Care Physician:  [name], MD ?Primary Gastroenterologist: [facility] gastro[facility]       ?Reason for Consultation: Abnormal CT of the rectum ?       ? HPI:   ?Yolanda Franco is a 88 y.o. female with a past medical history as listed below including interstitial cystitis and colon polyps, diabetes type 2, A-fib on Eliquis, hypertension and recent hospitalization for right hip fracture status postrepair who presented to the ER on 08/21/2021 with dysuria, abdominal pain and frequent urination/urgency for 9 days. ?   At time of arrival from [place], patient described worsening lower abdominal pain and UTI symptoms for 9 days and apparently had started Bactrim and transition to Cipro 2 days prior to presentation.  Apparently was given some pain meds at rehab which helped.  Increased frequency and urgency with urination as well and her urine was "somewhat red".  Described a small bowel movement on 3/14. ?   Today, the patient is seen with her son by her bedside to assist with history.  He explains that all of this started back in January when her constipation seemed to worsen and she developed acute rectal pain.  She was seen in the ER at that time with question of proctitis and given Flagyl and Proctofoam.  Shortly thereafter she had a colonoscopy with results as below.  A few days after that patient fell and broke her hip and had surgery and then was admitted to [facility] for rehab.  Tells me that really since January none of her symptoms have changed, she still experiences some rectal discomfort and tells me it is difficult to have bowel movements regardless of the suppositories they are giving her daily at the nursing facility.  Tells me this only produces a small amount of stool and she never feels completely empty. ?   New for her are symptoms of urinary frequency and dysuria as well as a red appearance to her urine.  Apparently they  have been trying to treat this at the nursing facility and started her on antibiotics which were not helping at all for the first 2 days, she was then switched to a different antibiotic as above but continued with lower abdominal/suprapubic pain and was brought to the ER. ?   Denies fever, chills or weight loss. ? ?ER course: UA with trace LE and positive nitrate, CTAP with asymmetric thickening along the wall of the distal left rectum and several prominent lymph nodes in the mesorectum with fat stranding concerning for possible inflammation/infection/neoplasm ? ?GI history: ?07/30/2021 Colonoscopy at [facility] showed a few nonbleeding diverticula with small openings in the sigmoid colon which was mild in severity, many sessile nonbleeding polyps of benign appearance ranging in size from 2 to 15 mm were found in the ascending colon with large areas of mucosa matted with multiple polyps.  2 were removed for biopsy (patient's son tells me he was told these were precancerous and that patient needed likely a colectomy to get rid of all the polyps), also had a large piece of stool in the rectum and some places of stool in the sigmoid colon and cecum limiting the exam. ? ?Past Medical History:  ?Diagnosis Date  ? Anxiety   ? Depression   ? Dizzy spells 10/27/2012  ? Heart disease   ? leaking valve  ? Migraine   ? Shaking spells 10/27/2012  ? Weakness 10/27/2012  ? ? ?  Past Surgical History:  ?Procedure Laterality Date  ? [place], 2002  ? BUNIONECTOMY Bilateral 1986  ? HIP ARTHROPLASTY Right 08/05/2021  ? Procedure: ARTHROPLASTY BIPOLAR HIP (HEMIARTHROPLASTY);  Surgeon: Mordecai Rasmussen, MD;  Location: [facility];  Service: Orthopedics;  Laterality: Right;  ? KNEE SURGERY Right 1991  ? Cartilage  ? NOSE SURGERY  mid 70's  ? VAGINAL HYSTERECTOMY  1980  ? ? ?Family History  ?Problem Relation Age of Onset  ? Stroke Mother   ? Other Father   ?     surgery complications  ? Migraines Other   ? Arthritis Other    ? ? ?Social History  ? ?Tobacco Use  ? Smoking status: Never  ? Smokeless tobacco: Never  ?Substance Use Topics  ? Alcohol use: No  ? Drug use: No  ? ? ?Prior to Admission medications   ?Medication Sig Start Date End Date Taking? Authorizing Provider  ?acetaminophen (TYLENOL) 325 MG tablet Take 2 tablets (650 mg total) by mouth every 6 (six) hours as needed for mild pain. ?Patient taking differently: Take 650 mg by mouth 2 (two) times daily. 08/07/21 09/06/21 Yes Shahmehdi, Seyed A, MD  ?amLODipine (NORVASC) 5 MG tablet Take 5 mg by mouth daily.   Yes [provider]  ?apixaban (ELIQUIS) 2.5 MG TABS tablet Take 2.5 mg by mouth 2 (two) times daily.   Yes [provider]  ?Azelastine HCl 137 MCG/SPRAY SOLN Place 1 spray into the nose 2 (two) times daily.   Yes [provider]  ?ciprofloxacin (CIPRO) 250 MG tablet Take 250 mg by mouth 2 (two) times daily. 08/19/21 08/22/21 Yes [provider]  ?ferrous gluconate (FERGON) 324 MG tablet Take 324 mg by mouth every Monday, Wednesday, and Friday.   Yes [provider]  ?FLUoxetine (PROZAC) 40 MG capsule Take 40 mg by mouth daily.   Yes [provider]  ?fluticasone (FLONASE) 50 MCG/ACT nasal spray Place 1 spray into both nostrils daily.   Yes [provider]  ?Glucosamine 500 MG CAPS Take 500 mg by mouth every evening. 01/08/19  Yes [provider]  ?guaiFENesin (MUCINEX) 600 MG 12 hr tablet Take 600 mg by mouth 2 (two) times daily.   Yes [provider]  ?HYDROcodone-acetaminophen (NORCO/VICODIN) 5-325 MG tablet Take 1 tablet by mouth every 6 (six) hours as needed for moderate pain.   Yes [provider]  ?HYDROCORTISONE ACE, RECTAL, 30 MG SUPP Place 30 mg rectally 2 (two) times daily.   Yes [provider]  ?insulin detemir (LEVEMIR) 100 UNIT/ML injection Inject 10 Units into the skin at bedtime.   Yes [provider]  ?levothyroxine (SYNTHROID, LEVOTHROID) 25 MCG tablet  Take 12.5 mcg by mouth daily before breakfast. 08/23/12  Yes [provider]  ?lidocaine (LMX) 4 % cream Apply 1 application. topically 2 (two) times daily. Coat proctocort suppository thoroughly prior to placement.   Yes [provider]  ?loratadine (CLARITIN) 10 MG tablet Take 10 mg by mouth daily.   Yes [provider]  ?mirtazapine (REMERON) 15 MG tablet Take 15 mg by mouth at bedtime.   Yes [provider]  ?Multiple Vitamin (MULTIVITAMIN WITH MINERALS) TABS tablet Take 1 tablet by mouth daily. 08/07/21  Yes Shahmehdi, Valeria Batman, MD  ?phenazopyridine (PYRIDIUM) 100 MG tablet Take 100 mg by mouth 3 (three) times daily as needed for pain (dysuria or bladder spasms).   Yes [provider]  ?traZODone (DESYREL) 150 MG tablet Take 150  mg by mouth at bedtime.   Yes [provider]  ?feeding supplement (ENSURE ENLIVE / ENSURE PLUS) LIQD Take 237 mLs by mouth 2 (two) times daily between meals. ?Patient not taking: Reported on 08/21/2021 08/07/21   Deatra James, MD  ?polyethylene glycol (MIRALAX / GLYCOLAX) 17 g packet Take 17 g by mouth daily. ?Patient not taking: Reported on 08/21/2021 08/08/21   Deatra James, MD  ?senna-docusate (SENOKOT-S) 8.6-50 MG tablet Take 1 tablet by mouth 2 (two) times daily. ?Patient not taking: Reported on 08/21/2021 08/07/21 09/06/21  Deatra James, MD  ? ? ?Current Facility-Administered Medications  ?Medication Dose Route Frequency Provider Last Rate Last Admin  ? acetaminophen (TYLENOL) tablet 650 mg  650 mg Oral Q6H PRN Mercy Riding, MD      ? Or  ? acetaminophen (TYLENOL) suppository 650 mg  650 mg Rectal Q6H PRN Gonfa, Taye T, MD      ? amLODipine (NORVASC) tablet 5 mg  5 mg Oral Daily Gonfa, Taye T, MD      ? cefTRIAXone (ROCEPHIN) 1 g in sodium chloride 0.9 % 100 mL IVPB  1 g Intravenous q1800 Wendee Beavers T, MD 200 mL/hr at 08/21/21 2147 1 g at 08/21/21 2147  ? enoxaparin (LOVENOX) injection 40 mg  40 mg Subcutaneous Q24H  Wendee Beavers T, MD   40 mg at 08/21/21 2148  ? fentaNYL (SUBLIMAZE) injection 25 mcg  25 mcg Intravenous Q2H PRN Mercy Riding, MD   25 mcg at 08/22/21 0503  ? FLUoxetine (PROZAC) capsule 40 mg  40 mg Oral Da

## 2021-08-22 NOTE — Progress Notes (Signed)
PT Cancellation Note ? ?Patient Details ?Name: Yolanda Franco ?MRN: [id number] ?DOB: 07/22/1932 ? ? ?Cancelled Treatment:    Reason Eval/Treat Not Completed: Other (comment) (pt is doing bowel prep, she reported her bed is soiled with BM and she needs to be cleaned up. RN notified. Will follow.) ? ?[name] PT 08/22/2021  ?Acute Rehabilitation Services ?Pager [phone] ?Office [phone] ? ?

## 2021-08-22 NOTE — Progress Notes (Signed)
?PROGRESS NOTE ? ?Yolanda Franco FYB:[id number] DOB: [birth year]  ? ?PCP: [name], MD ? ?Patient is from: SNF ? ?DOA: 08/21/2021 LOS: 1 ? ?Chief complaints ?Chief Complaint  ?Patient presents with  ? Abdominal Pain  ? Dysuria  ?  ? ?Brief Narrative / Interim history: ?88 year old F with PMH of colon polyps, interstitial cystitis, IDDM-2, paroxysmal A-fib on Eliquis, anxiety, depression, hypothyroidism, HTN, colonic polyps and recent hospitalization for right hip fracture s/p repair presenting with dysuria, abdominal pain, frequent urination and urgency for about 9 days.  Treated with Bactrim and transitioned to p.o. Cipro 2 days prior to her arrival.  CT abdomen and pelvis with asymmetric thickening through a broad portion of the distal left rectum and several prominent lymph nodes in the mesorectum with fat stranding concerning for possible inflammation/infection/neoplasm.  Urine culture obtained.  [facility] consulted.  Hospitalist service called for admission.  ? ?Subjective: ?Seen and examined earlier this morning.  Continues to endorse pain across lower abdomen, dysuria, frequency and urgency.  She describes the pain as waves.  Rates her pain 4/10.  She denies nausea, vomiting or fever. ? ?Objective: ?Vitals:  ? 08/21/21 2205 08/22/21 5277 08/22/21 0449 08/22/21 0923  ?BP: (!) 123/57 119/60 (!) 141/72 125/61  ?Pulse: 65 72 69 63  ?Resp: 14 16 14 18   ?Temp: 97.7 ?F (36.5 ?C) 98.9 ?F (37.2 ?C) 98.5 ?F (36.9 ?C) (!) 97.5 ?F (36.4 ?C)  ?TempSrc: Oral Oral Oral Oral  ?SpO2: 94% 94% 98% 94%  ? ? ?Examination: ? ?GENERAL: No apparent distress.  Nontoxic. ?HEENT: MMM.  Vision and hearing grossly intact.  ?NECK: Supple.  No apparent JVD.  ?RESP:  No IWOB.  Fair aeration bilaterally. ?CVS:  RRR. Heart sounds normal.  ?ABD/GI/GU: BS+. Abd soft.  Mild tenderness over RLQ and suprapubic area.  No rebound or guarding. ?MSK/EXT:  Moves extremities. No apparent deformity. No edema.  ?SKIN: Surgical site over right hip  DCI. ?NEURO: Awake, alert and oriented appropriately.  No apparent focal neuro deficit. ?PSYCH: Calm. Normal affect.  ? ?Procedures:  ?None ? ?Microbiology summarized: ?COVID-19 and influenza PCR nonreactive. ?Urine culture pending. ? ?Assessment and Plan: ?* Abdominal pain ?Tenderness over RLQ and suprapubic area.  No rebound or guarding.  Concern about possible UTI.  CT  with asymmetric thickening through the broad portion of the distal left rectum concerning for inflammation/infection/malignancy.  Patient reports numerous colon polyps on recent colonoscopy.  CA 19-9 within normal.  CEA elevated to 66.  Mild elevated ESR but CRP within normal.  ?-Continue IV ceftriaxone for possible UTI pending urine culture ?-GI consult pending ?-Pain control with as needed Tylenol, oxycodone and fentanyl ? ?Abnormal computed tomography angiography (CTA) of abdomen and pelvis ?CT A/P raises concern for rectal inflammation/infection/neoplasm.  Patient reports numerous polyps on recent colonoscopy. ?-See abdominal pain ? ?UTI (urinary tract infection) ?Patient with history of interstitial cystitis.  Has dysuria, frequency and urgency for 9 days.  Reportedly treated with Bactrim and transition to p.o. Cipro 2 days prior to presentation.  UA with trace LE and positive nitrite but no bacteria. ?-IV ceftriaxone as above ?-Follow urine culture ? ?Colon polyps ?See abdominal pain above ? ?Insulin-requiring or dependent type II diabetes mellitus ([place]) ?A1c 5.2% 3 weeks prior. ?Recent Labs  ?Lab 08/21/21 ?2144 08/22/21 ?0728  ?GLUCAP 157* 127*  ?-CBG monitoring ?-SSI-sensitive ? ?Paroxysmal atrial fibrillation (HCC) ?Rate controlled without medication.  On Eliquis.  Last dose the morning of admission. ?-Continue holding Eliquis. ?-IV metoprolol as needed ? ?  Interstitial cystitis ?Followed by Dr. Amalia Hailey at [facility] ? ?Essential hypertension ?BP within acceptable range. ?-Resume home meds ? ?Hypothyroidism ?Continue home Synthroid ? ?Anxiety  and depression ?Stable.   ?-Continue home medication after med rec ? ?Closed displaced fracture of right femoral neck ([place]) ?Had ORIF previous hospitalization.  Surgical wound healing. ?-PT/OT eval ? ? ?There is no height or weight on file to calculate BMI. ?  ?  ?  ?  ?DVT prophylaxis:  ?enoxaparin (LOVENOX) injection 40 mg Start: 08/21/21 2200 ? ?Code Status: Full code ?Family Communication: Updated patient's son at bedside. ?Level of care: Med-Surg ?Status is: Inpatient ?Remains inpatient appropriate because: Further evaluation for abdominal pain and rectal mass, and IV antibiotics for possible UTI ? ? ?Final disposition: Likely back to SNF once medically ready ? ?Consultants:  ?Gastroenterology ? ?Sch Meds:  ?Scheduled Meds: ? amLODipine  5 mg Oral Daily  ? enoxaparin (LOVENOX) injection  40 mg Subcutaneous Q24H  ? FLUoxetine  40 mg Oral Daily  ? insulin aspart  0-5 Units Subcutaneous QHS  ? insulin aspart  0-9 Units Subcutaneous TID WC  ? levothyroxine  12.5 mcg Oral QAC breakfast  ? mirtazapine  15 mg Oral QHS  ? multivitamin with minerals  1 tablet Oral Daily  ? traZODone  150 mg Oral QHS  ? ?Continuous Infusions: ? cefTRIAXone (ROCEPHIN)  IV 1 g (08/21/21 2147)  ? ?PRN Meds:.acetaminophen **OR** acetaminophen, fentaNYL (SUBLIMAZE) injection, ondansetron **OR** ondansetron (ZOFRAN) IV, oxyCODONE ? ?Antimicrobials: ?Anti-infectives (From admission, onward)  ? ? Start     Dose/Rate Route Frequency Ordered Stop  ? 08/21/21 1800  cefTRIAXone (ROCEPHIN) 1 g in sodium chloride 0.9 % 100 mL IVPB       ? 1 g ?200 mL/hr over 30 Minutes Intravenous Daily-1800 08/21/21 1655 08/26/21 1759  ? ?  ? ? ? ?I have personally reviewed the following labs and images: ?CBC: ?Recent Labs  ?Lab 08/21/21 ?1316 08/22/21 ?0429  ?WBC 8.0 8.0  ?NEUTROABS 5.9  --   ?HGB 11.5* 10.3*  ?HCT 36.7 32.0*  ?MCV 92.2 92.8  ?PLT 471* 408*  ? ?BMP &GFR ?Recent Labs  ?Lab 08/21/21 ?1316 08/22/21 ?0429  ?NA 136 136  ?K 4.3 4.0  ?CL 104 107  ?CO2 26  24  ?GLUCOSE 116* 132*  ?BUN 9 10  ?CREATININE 0.59 0.49  ?CALCIUM 8.9 8.4*  ?MG  --  2.1  ?PHOS  --  3.0  ? ?CrCl cannot be calculated (Unknown ideal weight.). ?Liver & Pancreas: ?Recent Labs  ?Lab 08/21/21 ?1316 08/22/21 ?0429  ?AST 15  --   ?ALT 13  --   ?ALKPHOS 75  --   ?BILITOT 0.4  --   ?PROT 6.6  --   ?ALBUMIN 3.5 2.8*  ? ?Recent Labs  ?Lab 08/21/21 ?1316  ?LIPASE 24  ? ?No results for input(s): AMMONIA in the last 168 hours. ?Diabetic: ?No results for input(s): HGBA1C in the last 72 hours. ?Recent Labs  ?Lab 08/21/21 ?2144 08/22/21 ?0728  ?GLUCAP 157* 127*  ? ?Cardiac Enzymes: ?No results for input(s): CKTOTAL, CKMB, CKMBINDEX, TROPONINI in the last 168 hours. ?No results for input(s): PROBNP in the last 8760 hours. ?Coagulation Profile: ?No results for input(s): INR, PROTIME in the last 168 hours. ?Thyroid Function Tests: ?No results for input(s): TSH, T4TOTAL, FREET4, T3FREE, THYROIDAB in the last 72 hours. ?Lipid Profile: ?No results for input(s): CHOL, HDL, LDLCALC, TRIG, CHOLHDL, LDLDIRECT in the last 72 hours. ?Anemia Panel: ?No results for input(s): VITAMINB12, FOLATE, FERRITIN,  TIBC, IRON, RETICCTPCT in the last 72 hours. ?Urine analysis: ?   ?Component Value Date/Time  ? COLORURINE ORANGE (A) 08/21/2021 1316  ? APPEARANCEUR CLEAR 08/21/2021 1316  ? LABSPEC 1.025 08/21/2021 1316  ? PHURINE 6.5 08/21/2021 1316  ? GLUCOSEU 250 (A) 08/21/2021 1316  ? [place] NEGATIVE 08/21/2021 1316  ? BILIRUBINUR SMALL (A) 08/21/2021 1316  ? [place] NEGATIVE 08/21/2021 1316  ? PROTEINUR 30 (A) 08/21/2021 1316  ? NITRITE POSITIVE (A) 08/21/2021 1316  ? LEUKOCYTESUR TRACE (A) 08/21/2021 1316  ? ?Sepsis Labs: ?Invalid input(s): PROCALCITONIN, LACTICIDVEN ? ?Microbiology: ?No results found for this or any previous visit (from the past 240 hour(s)). ? ?Radiology Studies: ?CT ABDOMEN PELVIS W CONTRAST ? ?Result Date: 08/21/2021 ?CLINICAL DATA:  Abdominal pain, acute nonlocalized. Painful urination EXAM: CT ABDOMEN AND PELVIS  WITH CONTRAST TECHNIQUE: Multidetector CT imaging of the abdomen and pelvis was performed using the standard protocol following bolus administration of intravenous contrast. RADIATION DOSE REDUCTION: Thi

## 2021-08-22 NOTE — Progress Notes (Deleted)
[place] services.  ? ?This pt was asked to be seen by Palliative Care at [facility] where she was residing prior to this hospitalization.  We had an appointment scheduled to see the pt on 08/30/21 top see pt and introduce palliative care to the pt and family. Webb Silversmith RN [phone]  ?

## 2021-08-22 NOTE — Care Management CC44 (Signed)
Condition Code 44 Documentation Completed ? ?Patient Details  ?Name: Yolanda Franco ?MRN: [id number] ?Date of Birth: 1932-10-05 ? ? ?Condition Code 44 given:  Yes ?Patient signature on Condition Code 44 notice:  Yes (verbal consent) ?Documentation of 2 MD's agreement:  Yes ?Code 44 added to claim:  Yes ? ? ? ?Jamielee Mchale, LCSW ?08/22/2021, 4:19 PM ? ?

## 2021-08-22 NOTE — Progress Notes (Signed)
[place] ? ?This pt has been referred to Palliative Care services at [facility] where she resided prior to coming into the hospital. We have a meeting scheduled on Friday 08/30/21 to introduce our services. This documentation is for information purpose and coordination of care.  ? ?Webb Silversmith RN [phone] ? ? ?

## 2021-08-23 ENCOUNTER — Telehealth: Payer: Self-pay

## 2021-08-23 ENCOUNTER — Telehealth: Payer: Self-pay | Admitting: Gastroenterology

## 2021-08-23 DIAGNOSIS — R1030 Lower abdominal pain, unspecified: Secondary | ICD-10-CM | POA: Diagnosis not present

## 2021-08-23 DIAGNOSIS — R6889 Other general symptoms and signs: Secondary | ICD-10-CM | POA: Diagnosis not present

## 2021-08-23 DIAGNOSIS — K5900 Constipation, unspecified: Secondary | ICD-10-CM | POA: Diagnosis not present

## 2021-08-23 DIAGNOSIS — D649 Anemia, unspecified: Secondary | ICD-10-CM | POA: Diagnosis not present

## 2021-08-23 DIAGNOSIS — R10819 Abdominal tenderness, unspecified site: Secondary | ICD-10-CM | POA: Diagnosis not present

## 2021-08-23 DIAGNOSIS — N3010 Interstitial cystitis (chronic) without hematuria: Secondary | ICD-10-CM | POA: Diagnosis not present

## 2021-08-23 DIAGNOSIS — S72001D Fracture of unspecified part of neck of right femur, subsequent encounter for closed fracture with routine healing: Secondary | ICD-10-CM | POA: Diagnosis not present

## 2021-08-23 DIAGNOSIS — E039 Hypothyroidism, unspecified: Secondary | ICD-10-CM | POA: Diagnosis not present

## 2021-08-23 DIAGNOSIS — R1312 Dysphagia, oropharyngeal phase: Secondary | ICD-10-CM | POA: Diagnosis not present

## 2021-08-23 DIAGNOSIS — K6289 Other specified diseases of anus and rectum: Secondary | ICD-10-CM | POA: Diagnosis not present

## 2021-08-23 DIAGNOSIS — Z794 Long term (current) use of insulin: Secondary | ICD-10-CM | POA: Diagnosis not present

## 2021-08-23 DIAGNOSIS — D122 Benign neoplasm of ascending colon: Secondary | ICD-10-CM | POA: Diagnosis not present

## 2021-08-23 DIAGNOSIS — K5903 Drug induced constipation: Secondary | ICD-10-CM

## 2021-08-23 DIAGNOSIS — Z515 Encounter for palliative care: Secondary | ICD-10-CM | POA: Diagnosis not present

## 2021-08-23 DIAGNOSIS — R498 Other voice and resonance disorders: Secondary | ICD-10-CM | POA: Diagnosis not present

## 2021-08-23 DIAGNOSIS — S72002D Fracture of unspecified part of neck of left femur, subsequent encounter for closed fracture with routine healing: Secondary | ICD-10-CM | POA: Diagnosis not present

## 2021-08-23 DIAGNOSIS — Z7982 Long term (current) use of aspirin: Secondary | ICD-10-CM | POA: Diagnosis not present

## 2021-08-23 DIAGNOSIS — I480 Paroxysmal atrial fibrillation: Secondary | ICD-10-CM | POA: Diagnosis not present

## 2021-08-23 DIAGNOSIS — J9601 Acute respiratory failure with hypoxia: Secondary | ICD-10-CM | POA: Diagnosis not present

## 2021-08-23 DIAGNOSIS — E119 Type 2 diabetes mellitus without complications: Secondary | ICD-10-CM | POA: Diagnosis not present

## 2021-08-23 DIAGNOSIS — R109 Unspecified abdominal pain: Secondary | ICD-10-CM | POA: Diagnosis not present

## 2021-08-23 DIAGNOSIS — R531 Weakness: Secondary | ICD-10-CM | POA: Diagnosis not present

## 2021-08-23 DIAGNOSIS — R350 Frequency of micturition: Secondary | ICD-10-CM | POA: Diagnosis not present

## 2021-08-23 DIAGNOSIS — S72001A Fracture of unspecified part of neck of right femur, initial encounter for closed fracture: Secondary | ICD-10-CM | POA: Diagnosis not present

## 2021-08-23 DIAGNOSIS — R1031 Right lower quadrant pain: Secondary | ICD-10-CM | POA: Diagnosis not present

## 2021-08-23 DIAGNOSIS — R1032 Left lower quadrant pain: Secondary | ICD-10-CM | POA: Diagnosis not present

## 2021-08-23 DIAGNOSIS — I10 Essential (primary) hypertension: Secondary | ICD-10-CM | POA: Diagnosis not present

## 2021-08-23 DIAGNOSIS — R3915 Urgency of urination: Secondary | ICD-10-CM | POA: Diagnosis not present

## 2021-08-23 DIAGNOSIS — M80051D Age-related osteoporosis with current pathological fracture, right femur, subsequent encounter for fracture with routine healing: Secondary | ICD-10-CM | POA: Diagnosis not present

## 2021-08-23 DIAGNOSIS — F419 Anxiety disorder, unspecified: Secondary | ICD-10-CM | POA: Diagnosis not present

## 2021-08-23 DIAGNOSIS — Z7984 Long term (current) use of oral hypoglycemic drugs: Secondary | ICD-10-CM | POA: Diagnosis not present

## 2021-08-23 DIAGNOSIS — Z96641 Presence of right artificial hip joint: Secondary | ICD-10-CM | POA: Diagnosis not present

## 2021-08-23 DIAGNOSIS — N3001 Acute cystitis with hematuria: Secondary | ICD-10-CM | POA: Diagnosis not present

## 2021-08-23 DIAGNOSIS — Z79899 Other long term (current) drug therapy: Secondary | ICD-10-CM | POA: Diagnosis not present

## 2021-08-23 DIAGNOSIS — M25551 Pain in right hip: Secondary | ICD-10-CM | POA: Diagnosis not present

## 2021-08-23 DIAGNOSIS — B372 Candidiasis of skin and nail: Secondary | ICD-10-CM | POA: Diagnosis not present

## 2021-08-23 DIAGNOSIS — I4891 Unspecified atrial fibrillation: Secondary | ICD-10-CM | POA: Diagnosis not present

## 2021-08-23 DIAGNOSIS — R262 Difficulty in walking, not elsewhere classified: Secondary | ICD-10-CM | POA: Diagnosis not present

## 2021-08-23 DIAGNOSIS — R935 Abnormal findings on diagnostic imaging of other abdominal regions, including retroperitoneum: Secondary | ICD-10-CM | POA: Diagnosis not present

## 2021-08-23 DIAGNOSIS — Z7401 Bed confinement status: Secondary | ICD-10-CM | POA: Diagnosis not present

## 2021-08-23 DIAGNOSIS — R1319 Other dysphagia: Secondary | ICD-10-CM | POA: Diagnosis not present

## 2021-08-23 DIAGNOSIS — Z9889 Other specified postprocedural states: Secondary | ICD-10-CM | POA: Diagnosis not present

## 2021-08-23 DIAGNOSIS — R1084 Generalized abdominal pain: Secondary | ICD-10-CM | POA: Diagnosis not present

## 2021-08-23 DIAGNOSIS — M6281 Muscle weakness (generalized): Secondary | ICD-10-CM | POA: Diagnosis not present

## 2021-08-23 DIAGNOSIS — F32A Depression, unspecified: Secondary | ICD-10-CM | POA: Diagnosis not present

## 2021-08-23 DIAGNOSIS — R634 Abnormal weight loss: Secondary | ICD-10-CM | POA: Diagnosis not present

## 2021-08-23 DIAGNOSIS — R2689 Other abnormalities of gait and mobility: Secondary | ICD-10-CM | POA: Diagnosis not present

## 2021-08-23 DIAGNOSIS — Z7901 Long term (current) use of anticoagulants: Secondary | ICD-10-CM | POA: Diagnosis not present

## 2021-08-23 DIAGNOSIS — Z9181 History of falling: Secondary | ICD-10-CM | POA: Diagnosis not present

## 2021-08-23 DIAGNOSIS — R8271 Bacteriuria: Secondary | ICD-10-CM | POA: Diagnosis not present

## 2021-08-23 DIAGNOSIS — N390 Urinary tract infection, site not specified: Secondary | ICD-10-CM | POA: Diagnosis not present

## 2021-08-23 DIAGNOSIS — Z743 Need for continuous supervision: Secondary | ICD-10-CM | POA: Diagnosis not present

## 2021-08-23 DIAGNOSIS — R319 Hematuria, unspecified: Secondary | ICD-10-CM | POA: Diagnosis not present

## 2021-08-23 DIAGNOSIS — I119 Hypertensive heart disease without heart failure: Secondary | ICD-10-CM | POA: Diagnosis not present

## 2021-08-23 DIAGNOSIS — R300 Dysuria: Secondary | ICD-10-CM | POA: Diagnosis not present

## 2021-08-23 DIAGNOSIS — N3000 Acute cystitis without hematuria: Secondary | ICD-10-CM | POA: Diagnosis not present

## 2021-08-23 DIAGNOSIS — S7291XA Unspecified fracture of right femur, initial encounter for closed fracture: Secondary | ICD-10-CM | POA: Diagnosis not present

## 2021-08-23 LAB — RENAL FUNCTION PANEL
Albumin: 2.7 g/dL — ABNORMAL LOW (ref 3.5–5.0)
Anion gap: 6 (ref 5–15)
BUN: 8 mg/dL (ref 8–23)
CO2: 26 mmol/L (ref 22–32)
Calcium: 8.7 mg/dL — ABNORMAL LOW (ref 8.9–10.3)
Chloride: 107 mmol/L (ref 98–111)
Creatinine, Ser: 0.41 mg/dL — ABNORMAL LOW (ref 0.44–1.00)
GFR, Estimated: >60 mL/min (ref >60)
Glucose, Bld: 113 mg/dL — ABNORMAL HIGH (ref 70–99)
Phosphorus: 3.2 mg/dL (ref 2.5–4.6)
Potassium: 3.5 mmol/L (ref 3.5–5.1)
Sodium: 139 mmol/L (ref 135–145)

## 2021-08-23 LAB — GLUCOSE, CAPILLARY
Glucose-Capillary: 124 mg/dL — ABNORMAL HIGH (ref 70–99)
Glucose-Capillary: 108 mg/dL — ABNORMAL HIGH (ref 70–99)
Glucose-Capillary: 117 mg/dL — ABNORMAL HIGH (ref 70–99)

## 2021-08-23 LAB — CBC
HCT: 32.2 % — ABNORMAL LOW (ref 36.0–46.0)
Hemoglobin: 10.1 g/dL — ABNORMAL LOW (ref 12.0–15.0)
MCH: 29.3 pg (ref 26.0–34.0)
MCHC: 31.4 g/dL (ref 30.0–36.0)
MCV: 93.3 fL (ref 80.0–100.0)
Platelets: 366 10*3/uL (ref 150–400)
RBC: 3.45 MIL/uL — ABNORMAL LOW (ref 3.87–5.11)
RDW: 14.6 % (ref 11.5–15.5)
WBC: 6.8 10*3/uL (ref 4.0–10.5)
nRBC: 0 % (ref 0.0–0.2)

## 2021-08-23 LAB — URINE CULTURE: Culture: <10000 — AB

## 2021-08-23 LAB — MAGNESIUM: Magnesium: 2 mg/dL (ref 1.7–2.4)

## 2021-08-23 MED ORDER — SENNOSIDES-DOCUSATE SODIUM 8.6-50 MG PO TABS: 1.0000 | ORAL_TABLET | Freq: Two times a day (BID) | ORAL | 0 refills | Status: DC | PRN

## 2021-08-23 MED ORDER — POLYETHYLENE GLYCOL 3350 17 GM/SCOOP PO POWD: 17.0000 g | Freq: Two times a day (BID) | ORAL | 0 refills | Status: DC | PRN

## 2021-08-23 MED ORDER — SODIUM CHLORIDE 0.9 % IV SOLN
1.0000 g | INTRAVENOUS | Status: DC
  Filled 2021-08-23: qty 10

## 2021-08-23 MED ORDER — FLEET ENEMA 7-19 GM/118ML RE ENEM: 1.0000 | ENEMA | Freq: Every day | RECTAL | 0 refills | Status: DC | PRN

## 2021-08-23 NOTE — NC FL2 (Signed)
?[place] MEDICAID FL2 LEVEL OF CARE SCREENING TOOL  ?  ? ?IDENTIFICATION  ?Patient Name: ?Yolanda Franco Birthdate: 05-17-1933 Sex: female Admission Date (Current Location): ?08/21/2021  ?[place] and [place] Number: ? [place] ?  Facility and Address:  ?[facility],  [address], [place] ?     Provider Number: ?[id number]  ?Attending Physician Name and Address:  ?Mercy Riding, MD ? Relative Name and Phone Number:  ?  ?   ?Current Level of Care: ?Hospital Recommended Level of Care: ?[place] Prior Approval Number: ?  ? ?Date Approved/Denied: ?  PASRR Number: ?[id number] A ? ?Discharge Plan: ?SNF ?  ? ?Current Diagnoses: ?Patient Active Problem List  ? Diagnosis Date Noted  ? Drug-induced constipation   ? Abdominal pain 08/21/2021  ? UTI (urinary tract infection) 08/21/2021  ? Insulin-requiring or dependent type II diabetes mellitus ([place]) 08/21/2021  ? Interstitial cystitis 08/21/2021  ? Colon polyps 08/21/2021  ? Abnormal computed tomography angiography (CTA) of abdomen and pelvis 08/21/2021  ? Closed displaced fracture of right femoral neck ([place]) 08/03/2021  ? Paroxysmal atrial fibrillation ([place]) 08/03/2021  ? Anxiety and depression 08/03/2021  ? Hypothyroidism 08/03/2021  ? Essential hypertension 08/03/2021  ? Dizzy spells 10/27/2012  ? Shaking spells 10/27/2012  ? Weakness 10/27/2012  ? ? ?Orientation RESPIRATION BLADDER Height & Weight   ?  ?Self, Time, Situation, Place ?   Continent Weight:   ?Height:     ?BEHAVIORAL SYMPTOMS/MOOD NEUROLOGICAL BOWEL NUTRITION STATUS  ?    Continent    ?AMBULATORY STATUS COMMUNICATION OF NEEDS Skin   ?Extensive Assist Verbally   ?  ?  ?  ?    ?     ?     ? ? ?Personal Care Assistance Level of Assistance  ?Bathing, Dressing Bathing Assistance: Limited assistance ?Feeding assistance: Limited assistance ?Dressing Assistance: Limited assistance ?   ? ?Functional Limitations Info  ?    ?  ?   ? ? ?SPECIAL CARE FACTORS FREQUENCY  ?PT (By  licensed PT), OT (By licensed OT)   ?  ?PT Frequency: 5x/wk ?OT Frequency: 5x/wk ?  ?  ?  ?   ? ? ?Contractures Contractures Info: Not present  ? ? ?Additional Factors Info  ?Code Status, Allergies, Psychotropic, Insulin Sliding Scale Code Status Info: Full ?Allergies Info: Aspirin, Codeine, Eggs Or Egg-derived Products, Poultry Meal     Isolation: (none)  Code Status: FULL ?Psychotropic Info: see MAR ?Insulin Sliding Scale Info: see MAR ?  ?   ? ?Current Medications (08/23/2021):  This is the current hospital active medication list ?Current Facility-Administered Medications  ?Medication Dose Route Frequency Provider Last Rate Last Admin  ? acetaminophen (TYLENOL) tablet 650 mg  650 mg Oral Q6H PRN Mercy Riding, MD   650 mg at 08/23/21 2542  ? Or  ? acetaminophen (TYLENOL) suppository 650 mg  650 mg Rectal Q6H PRN Wendee Beavers T, MD      ? amLODipine (NORVASC) tablet 5 mg  5 mg Oral Daily Wendee Beavers T, MD   5 mg at 08/23/21 0904  ? cefTRIAXone (ROCEPHIN) 1 g in sodium chloride 0.9 % 100 mL IVPB  1 g Intravenous Q24H Gonfa, Taye T, MD      ? enoxaparin (LOVENOX) injection 40 mg  40 mg Subcutaneous Q24H Wendee Beavers T, MD   40 mg at 08/22/21 2117  ? fentaNYL (SUBLIMAZE) injection 25 mcg  25 mcg Intravenous Q2H PRN Mercy Riding, MD  25 mcg at 08/22/21 0503  ? FLUoxetine (PROZAC) capsule 40 mg  40 mg Oral Daily Wendee Beavers T, MD   40 mg at 08/23/21 0904  ? insulin aspart (novoLOG) injection 0-5 Units  0-5 Units Subcutaneous QHS Gonfa, Taye T, MD      ? insulin aspart (novoLOG) injection 0-9 Units  0-9 Units Subcutaneous TID WC Mercy Riding, MD   1 Units at 08/23/21 0800  ? levothyroxine (SYNTHROID) tablet 12.5 mcg  12.5 mcg Oral QAC breakfast Gonfa, Taye T, MD      ? mirtazapine (REMERON) tablet 15 mg  15 mg Oral QHS Gonfa, Taye T, MD      ? multivitamin with minerals tablet 1 tablet  1 tablet Oral Daily Mercy Riding, MD   1 tablet at 08/23/21 0904  ? ondansetron (ZOFRAN) tablet 4 mg  4 mg Oral Q6H PRN Mercy Riding,  MD      ? Or  ? ondansetron (ZOFRAN) injection 4 mg  4 mg Intravenous Q6H PRN Gonfa, Taye T, MD      ? oxyCODONE (Oxy IR/ROXICODONE) immediate release tablet 5 mg  5 mg Oral Q6H PRN Mercy Riding, MD   5 mg at 08/23/21 0092  ? traZODone (DESYREL) tablet 150 mg  150 mg Oral QHS Wendee Beavers T, MD   150 mg at 08/22/21 2117  ? ? ? ?Discharge Medications: ?Please see discharge summary for a list of discharge medications. ? ?Relevant Imaging Results: ? ?Relevant Lab Results: ? ? ?Additional Information ?SSN: [id number]. Pt reports she received COVID vaccines. ? ?Mychael Soots, LCSW ? ? ? ? ?

## 2021-08-23 NOTE — Telephone Encounter (Signed)
-----   Message from Levin Erp, [place] sent at 08/23/2021 12:32 PM EDT ----- ?Regarding: follow up ?Please get this patient a follow up appointment with Dr. Neville Route only in 2 months for polyps. ? ?Thanks-JLL ? ?

## 2021-08-23 NOTE — Progress Notes (Addendum)
? ? Progress Note ? ? Subjective  ?Day #2 ? ?Chief Complaint: Abnormal CT of the rectum, abdominal pain ? ?Today, the patient tells me that her lower abdominal pain is much better, but she continues with a rectal pain especially when passing stool that feels like a "throbbing" that will last for a few minutes and then seems to go away.  Nursing staff is in the room at time of interview and tells me patient has been passing clear liquid stool with "globs of bigger stool", ever since doing the MiraLAX prep which she drank all of. ? ? Objective  ? ?Vital signs in last 24 hours: ?Temp:  [98.3 ?F (36.8 ?C)-98.6 ?F (37 ?C)] 98.6 ?F (37 ?C) (03/17 0617) ?Pulse Rate:  [61-66] 65 (03/17 0617) ?Resp:  [16-19] 19 (03/17 0617) ?BP: (118-146)/(52-68) 123/52 (03/17 0617) ?SpO2:  [95 %-96 %] 95 % (03/17 0617) ?Last BM Date : 08/22/21 ?General:    Elderly white female in NAD ?Heart:  Regular rate and rhythm; no murmurs ?Lungs: Respirations even and unlabored, lungs CTA bilaterally ?Abdomen:  Soft, nontender and nondistended. Normal bowel sounds. ?Psych:  Cooperative. Normal mood and affect. ? ?Intake/Output from previous day: ?03/16 0701 - 03/17 0700 ?In: 66 [P.O.:620; IV Piggyback:135] ?Out: 100 [Urine:100] ? ? ?Lab Results: ?Recent Labs  ?  08/21/21 ?1316 08/22/21 ?0429 08/23/21 ?0402  ?WBC 8.0 8.0 6.8  ?HGB 11.5* 10.3* 10.1*  ?HCT 36.7 32.0* 32.2*  ?PLT 471* 408* 366  ? ?BMET ?Recent Labs  ?  08/21/21 ?1316 08/22/21 ?0429 08/23/21 ?0402  ?NA 136 136 139  ?K 4.3 4.0 3.5  ?CL 104 107 107  ?CO2 '26 24 26  '$ ?GLUCOSE 116* 132* 113*  ?BUN '9 10 8  '$ ?CREATININE 0.59 0.49 0.41*  ?CALCIUM 8.9 8.4* 8.7*  ? ?LFT ?Recent Labs  ?  08/21/21 ?1316 08/22/21 ?0429 08/23/21 ?0402  ?PROT 6.6  --   --   ?ALBUMIN 3.5   < > 2.7*  ?AST 15  --   --   ?ALT 13  --   --   ?ALKPHOS 75  --   --   ?BILITOT 0.4  --   --   ? < > = values in this interval not displayed.  ? ?Studies/Results: ?CT ABDOMEN PELVIS W CONTRAST ? ?Result Date: 08/21/2021 ?CLINICAL DATA:   Abdominal pain, acute nonlocalized. Painful urination EXAM: CT ABDOMEN AND PELVIS WITH CONTRAST TECHNIQUE: Multidetector CT imaging of the abdomen and pelvis was performed using the standard protocol following bolus administration of intravenous contrast. RADIATION DOSE REDUCTION: This exam was performed according to the departmental dose-optimization program which includes automated exposure control, adjustment of the mA and/or kV according to patient size and/or use of iterative reconstruction technique. CONTRAST:  15m OMNIPAQUE IOHEXOL 300 MG/ML  SOLN COMPARISON:  None. FINDINGS: Lower chest: Lung bases are clear. Chronic elevation of the LEFT hemidiaphragm Hepatobiliary: No focal hepatic lesion. No biliary duct dilatation. Common bile duct is normal. Pancreas: Pancreas is normal. No ductal dilatation. No pancreatic inflammation. Spleen: Normal spleen Adrenals/urinary tract: Adrenal glands and kidneys are normal. The ureters and bladder normal. Stomach/Bowel: Stomach, small bowel, appendix, and cecum are normal. Several scattered diverticula LEFT colon without acute inflammation. There is asymmetric thickening of along the LEFT wall the rectum from 12:00 to 6:00 with broad region of thickened material measuring 14 mm (image 78/2). There is enlarged lymph node in the LEFT perirectal sheath measuring 7 mm (image 72/2). Evaluation through this region of the pelvis is degraded  by streak artifact from RIGHT hip prosthetic. Mild fat stranding in the perirectal sheath. Vascular/Lymphatic: Abdominal aorta normal. No periaortic or periportal adenopathy. Prominent lymph node in the LEFT operator space measuring 9 mm (image 64/2). Small similar RIGHT common iliac lymph node measures 9 mm image 63/2 Reproductive: Post hysterectomy.  Adnexa unremarkable Other: No free fluid. Musculoskeletal: RIGHT hip prosthetics above. No aggressive osseous lesion posterior lumbar fusion IMPRESSION: 1. Asymmetric thickening through a broad  portion of the distal LEFT rectum. Several prominent lymph nodes in the mesorectum with fat stranding. Differential would include rectal inflammation/infection versus rectal neoplasm. Wall adherent stool is a secondary possibility with associated inflammation. Favor rectal neoplasm. Recommend direct optical visualization. 2. Normal appendix. 3. No obstructive uropathy.  No urinary tract infection identified. 4. Marked chronic elevation of the LEFT hemidiaphragm Electronically Signed   By: Suzy Bouchard M.D.   On: 08/21/2021 15:11   ? ? Assessment / Plan:   ?Assessment: ?1.  Lower abdominal pain: This has improved since treatment of the UTI ?2.  Abnormal CT of the rectum: Thought related to stool burden/obstipation, continues with some rectal discomfort and passing a stool, has had multiple liquid stools overnight after MiraLAX prep ?3.  A-fib: On Eliquis currently on hold, last dose 3/15 a.m. ?4.  History of multiple colonic polyps: On colonoscopy 07/30/2021, discussion about colectomy per previous GI physician ?5.  Closed displaced fracture of the right femoral neck: Had ORIF previous hospitalization 08/03/2021 ? ?Plan: ?1.  Continue treatment for UTI ?2.  Patient successfully finished a MiraLAX bowel purge but continues with some rectal discomfort.  SHE SHOULD BE ON MIRALAX DAILY ?3.  As per Dr. Henrene Pastor would be reasonable to repeat colonoscopy at some point after she has recovered from her hip surgery, at that point recommend a 2-day bowel prep to determine if the polyps, despite the burden, could be addressed endoscopically (as opposed to surgically) in this elderly female with multiple comorbidities ?4. Patient did receive her two fleets enemas yesterday, confirmed with nursing staff. ? ?Thank you for kind consultation, we will continue to follow. ? ? ? LOS: 1 day  ? ?Lavone Nian [facility]  08/23/2021, 9:39 AM ? ?GI ATTENDING ? ?Interval history data reviewed.  Patient seen and examined.  Agree with interval  progress note as outlined above.  Patient received enemas and has had a colon purge.  Seems to be responding to treatment for UTI.  They wish to send her back to skilled nursing facility.  Okay from GI perspective.  She should follow-up in my office in a couple months after she is recovered from all of her current issues.  She should be on MiraLAX daily to avoid recurrent problems with constipation/obstipation/fecal impaction.  I did discuss with her the prospects of repeating colonoscopy with more extensive preparation to see if her colonic polyposis can be dealt with endoscopically. ? ?Docia Chuck. Geri [place]., M.D. ?[place] ?Division of Gastroenterology  ?

## 2021-08-23 NOTE — Telephone Encounter (Signed)
Patient has been scheduled for a 61-monthhospital follow up with Dr. PHenrene Pastoron Wednesday, 10/23/21 at 2:40 pm. Appt information will be available on hospital discharge papers. Will mail appt information to patient and I will also [facility] (P: [phone] F: [phone] ?

## 2021-08-23 NOTE — TOC Transition Note (Signed)
Transition of Care (TOC) - CM/SW Discharge Note ? ? ?Patient Details  ?Name: Yolanda Franco ?MRN: [id number] ?Date of Birth: 1932-10-04 ? ?Transition of Care (TOC) CM/SW Contact:  ?Shalisa Mcquade, LCSW ?Phone Number: ?08/23/2021, 2:51 PM ? ? ?Clinical Narrative:    ?Pt medically cleared today for return to [facility]. Pt and son aware and agreeable.  Have received insurance auth for return as well.  PTAR called at 2:50pm.  RN to call report to [phone].  No further TOC needs. ? ? ?Final next level of care: [place] ?Barriers to Discharge: Barriers Resolved ? ? ?Patient Goals and CMS Choice ?Patient states their goals for this hospitalization and ongoing recovery are:: anticipates returning to SNF to complete rehab once medically cleared for this; long term plan to return home after rehab ?  ?  ? ?Discharge Placement ?  ?Existing PASRR number confirmed : 08/23/21          ?Patient chooses bed at: [facility] ?Patient to be transferred to facility by: PTAR ?Name of family member notified: son ?Patient and family notified of of transfer: 08/23/21 ? ?Discharge Plan and Services ?In-house Referral: Clinical Social Work ?  ?Post Acute Care Choice: [place]          ?DME Arranged: N/A ?DME Agency: NA ?  ?  ?  ?  ?  ?  ?  ?  ? ?Social Determinants of Health (SDOH) Interventions ?  ? ? ?Readmission Risk Interventions ?Readmission Risk Prevention Plan 08/22/2021  ?Transportation Screening Complete  ?PCP or Specialist Appt within 5-7 Days Complete  ?Home Care Screening Complete  ?Some recent data might be hidden  ? ? ? ? ? ?

## 2021-08-23 NOTE — Discharge Summary (Signed)
? ?Physician Discharge Summary  ?Yolanda Franco NWG:[id number] DOB: May 29, 1933 DOA: 08/21/2021 ? ?PCP: [name], MD ? ?Admit date: 08/21/2021 ?Discharge date: 08/23/2021 ?Admitted From: SNF ?Disposition: SNF ?Recommendations for Outpatient Follow-up:  ?Follow up with gastroenterology as below ?Follow-up with orthopedic surgery as previously planned ?Recommend follow-up with urology for interstitial cystitis as soon as possible ?Please obtain CBC/BMP/Mag at follow up ?Please follow up on the following pending results: None ? ? ?Discharge Condition: Stable ?CODE STATUS: Full code ? Follow-up Information   ? ? Yolanda Shipper, MD Follow up in 2 month(s).   ?Specialty: Gastroenterology ?Contact information: ?[address] ?[place] [address] ?[phone] ? ? ?  ?  ? ?  ?  ? ?  ? ? ?Hospital course ?88 year old F with PMH of colon polyps, interstitial cystitis, IDDM-2, paroxysmal A-fib on Eliquis, anxiety, depression, hypothyroidism, HTN, colonic polyps and recent hospitalization for right hip fracture s/p repair presenting with dysuria, abdominal pain, frequent urination and urgency for about 9 days.  Treated with Bactrim and transitioned to p.o. Cipro 2 days prior to her arrival.  CT abdomen and pelvis with asymmetric thickening through a broad portion of the distal left rectum and several prominent lymph nodes in the mesorectum with fat stranding concerning for possible inflammation/infection/neoplasm.  Urine culture obtained.  Started on IV ceftriaxone for possible UTI.  [facility] consulted.  Hospitalist service called for admission. ? ?Evaluated by GI and she had successful bowel purge with MiraLAX with improvement in her symptoms.  Cleared for discharge by GI for outpatient colonoscopy in 2 months once he fracture heals completely.  She is discharged on MiraLAX, Senokot-S and Fleet enema as needed based on severity of constipation.  She is off opiate as well. ? ?In regards to possible UTI, urine culture  with insignificant growth.  She completed 3 days of IV ceftriaxone.  Recommend outpatient follow-up with a urologist for interstitial cystitis.  ? ?See individual problem list below for more on hospital course. ? ?Problems addressed during this hospitalization ?Problem  ?Abdominal Pain  ?Uti (Urinary Tract Infection)  ?Abnormal Computed Tomography Angiography (Cta) of Abdomen and Pelvis  ?Insulin-Requiring Or Dependent Type II Diabetes Mellitus (Hcc)  ?Colon Polyps  ?Paroxysmal Atrial Fibrillation ([facility])  ?Interstitial Cystitis  ?Closed Displaced Fracture of Right Femoral Neck (Hcc)  ?Anxiety and Depression  ?Hypothyroidism  ?Essential Hypertension  ?  ?Assessment and Plan: ?* Abdominal pain ?Likely from constipation and possible UTI. CT A/P with asymmetric thickening through the broad portion of the distal left rectum concerning for inflammation/infection/malignancy. CA 19-9 within normal.  CEA elevated to 66.  Mild elevated ESR but CRP within normal.  Patient reports numerous polyps on recent colonoscopy.  Patient's symptoms improved after bowel purge with MiraLAX.  Also completed 3 days of IV ceftriaxone for possible UTI.  Cleared for discharge by GI for outpatient follow-up in 2 months for colonoscopy. ?-Bowel regimen with as needed MiraLAX, Senokot-S and Fleet enema based on severity ? ?Abnormal computed tomography angiography (CTA) of abdomen and pelvis ?CT A/P raises concern for rectal inflammation/infection/neoplasm.  Patient reports numerous polyps on recent colonoscopy. ?-See abdominal pain ? ?UTI (urinary tract infection) ?Patient with history of interstitial cystitis.  Has dysuria, frequency and urgency for 9 days.  Reportedly treated with Bactrim and transition to p.o. Cipro 2 days prior to presentation.  UA with trace LE and positive nitrite but no bacteria.  Urine culture with insignificant growth. ?-Completed 3 days of IV ceftriaxone in house ?-Recommend outpatient follow-up  with a urologist for  interstitial cystitis ? ?Colon polyps ?See abdominal pain above ? ?Insulin-requiring or dependent type II diabetes mellitus ([place]) ?A1c 5.2% 3 weeks prior. ?Recent Labs  ?Lab 08/22/21 ?1156 08/22/21 ?1637 08/22/21 ?2115 08/23/21 ?0731 08/23/21 ?1132  ?GLUCAP 125* 143* 143* 124* 108*  ?-Continue home insulin ?-Liberate diet ? ?Paroxysmal atrial fibrillation (HCC) ?Rate controlled without medication.  On Eliquis.  Last dose the morning of admission. ?-Continue home Eliquis. ? ?Interstitial cystitis ?Followed by Dr. Amalia Hailey at [facility] ?-Recommend outpatient follow-up with a urologist as soon as possible ? ?Essential hypertension ?BP within acceptable range.  Not on medications. ? ?Hypothyroidism ?Continue home Synthroid ? ?Anxiety and depression ?Stable.   ?-Continue home medication after med rec ? ?Closed displaced fracture of right femoral neck ([place]) ?Had ORIF previous hospitalization.  Surgical wound healing. ?Outpatient follow-up with orthopedic surgery as previously planned ? ? ? ? ?  ?  ?  ?  ? ?  ? ?Vital signs ?Vitals:  ? 08/22/21 1254 08/22/21 2203 08/23/21 0617 08/23/21 1311  ?BP: 118/68 (!) 146/57 (!) 123/52 134/63  ?Pulse: 61 66 65 62  ?Temp: 98.3 ?F (36.8 ?C) 98.5 ?F (36.9 ?C) 98.6 ?F (37 ?C) 97.8 ?F (36.6 ?C)  ?Resp: 16 18 19 15   ?SpO2: 96% 95% 95% 99%  ?TempSrc: Oral Oral Oral Oral  ?  ? ?Discharge exam ? ?GENERAL: No apparent distress.  Nontoxic. ?HEENT: MMM.  Vision and hearing grossly intact.  ?NECK: Supple.  No apparent JVD.  ?RESP:  No IWOB.  Fair aeration bilaterally. ?CVS:  RRR. Heart sounds normal.  ?ABD/GI/GU: BS+. Abd soft, NTND.  ?MSK/EXT:  Moves extremities. No apparent deformity. No edema.  ?SKIN: Surgical wound over right hip/thigh clean and healing. ?NEURO: Awake and alert. Oriented appropriately.  No apparent focal neuro deficit. ?PSYCH: Calm. Normal affect.  ? ?Discharge Instructions ?Discharge Instructions   ? ? Diet general   Complete by: As directed ?  ? Increase activity slowly    Complete by: As directed ?  ? ?  ? ?Allergies as of 08/23/2021   ? ?   Reactions  ? Aspirin Other (See Comments)  ? migraine  ? Codeine   ? Causes Migraines  ? Eggs Or Egg-derived Products   ? Causes Migraine  ? Poultry Meal Other (See Comments)  ? Causes Migraine  ? ?  ? ?  ?Medication List  ?  ? ?STOP taking these medications   ? ?ciprofloxacin 250 MG tablet ?Commonly known as: CIPRO ?  ?HYDROcodone-acetaminophen 5-325 MG tablet ?Commonly known as: NORCO/VICODIN ?  ?phenazopyridine 100 MG tablet ?Commonly known as: PYRIDIUM ?  ?polyethylene glycol 17 g packet ?Commonly known as: MIRALAX / GLYCOLAX ?Replaced by: polyethylene glycol powder 17 GM/SCOOP powder ?  ?sulfamethoxazole-trimethoprim 800-160 MG tablet ?Commonly known as: BACTRIM DS ?  ? ?  ? ?TAKE these medications   ? ?acetaminophen 325 MG tablet ?Commonly known as: TYLENOL ?Take 2 tablets (650 mg total) by mouth every 6 (six) hours as needed for mild pain. ?What changed: when to take this ?  ?amLODipine 5 MG tablet ?Commonly known as: NORVASC ?Take 5 mg by mouth daily. ?  ?apixaban 2.5 MG Tabs tablet ?Commonly known as: ELIQUIS ?Take 2.5 mg by mouth 2 (two) times daily. ?  ?Azelastine HCl 137 MCG/SPRAY Soln ?Place 1 spray into the nose 2 (two) times daily. ?  ?feeding supplement Liqd ?Take 237 mLs by mouth 2 (two) times daily between meals. ?  ?ferrous gluconate 324 MG tablet ?  Commonly known as: FERGON ?Take 324 mg by mouth every Monday, Wednesday, and Friday. ?  ?FLUoxetine 40 MG capsule ?Commonly known as: PROZAC ?Take 40 mg by mouth daily. ?  ?fluticasone 50 MCG/ACT nasal spray ?Commonly known as: FLONASE ?Place 1 spray into both nostrils daily. ?  ?Glucosamine 500 MG Caps ?Take 500 mg by mouth every evening. ?  ?guaiFENesin 600 MG 12 hr tablet ?Commonly known as: [place] ?Take 600 mg by mouth 2 (two) times daily. ?  ?HYDROCORTISONE ACE (RECTAL) 30 MG Supp ?Place 30 mg rectally 2 (two) times daily. ?  ?insulin detemir 100 UNIT/ML injection ?Commonly  known as: LEVEMIR ?Inject 10 Units into the skin at bedtime. ?  ?levothyroxine 25 MCG tablet ?Commonly known as: SYNTHROID ?Take 12.5 mcg by mouth daily before breakfast. ?  ?lidocaine 4 % cream ?Commonly known as: L

## 2021-08-23 NOTE — Telephone Encounter (Signed)
Good morning Dr. Ardis Hughs, ? ?(DoD for 08/23/21, a.m.) ? ?We received a referral from [facility] requesting we see this patient.  She is originally from [place], recently had a colonoscopy in [place] on 07/31/21 by Dr. Lennart Pall, where multiple polyps were found.  From what I am understanding, all the polyps were not removed and while she is living in this area in rehab, they want one of our doctors to remove the rest.  The referral from [place] and the records from [facility] are being sent to you for your consideration.  Please let me know how you'd like to proceed. ? ?Thank you. ?

## 2021-08-23 NOTE — Evaluation (Signed)
Physical Therapy Evaluation ?Patient Details ?Name: Yolanda Franco ?MRN: [id number] ?DOB: [birth year] ?Today's Date: 08/23/2021 ? ?History of Present Illness ? 88 year old F with PMH of colon polyps, interstitial cystitis, IDDM-2, paroxysmal A-fib on Eliquis, anxiety, depression, hypothyroidism, HTN and recent hospitalization for right hip fracture s/p hemiarthroplasty 08/05/21 presenting with dysuria, abdominal pain, frequent urination and urgency for about 9 days. Imaging showed possible rectal neoplasm.  ?Clinical Impression ? Pt admitted with above diagnosis. Min guard assist to ambulate 4' with RW, distance limited by fatigue. Pt performed BUE/LE strengthening exercises and was encouraged to perform these independently. Pt currently with functional limitations due to the deficits listed below (see PT Problem List). Pt will benefit from skilled PT to increase their independence and safety with mobility to allow discharge to the venue listed below.   ?   ?   ? ?Recommendations for follow up therapy are one component of a multi-disciplinary discharge planning process, led by the attending physician.  Recommendations may be updated based on patient status, additional functional criteria and insurance authorization. ? ?Follow Up Recommendations Skilled nursing-short term rehab (<3 hours/day) ? ?  ?Assistance Recommended at Discharge Intermittent Supervision/Assistance  ?Patient can return home with the following ? A lot of help with walking and/or transfers;A lot of help with bathing/dressing/bathroom;Help with stairs or ramp for entrance;Assistance with cooking/housework ? ?  ?Equipment Recommendations None recommended by PT  ?Recommendations for Other Services ?    ?  ?Functional Status Assessment Patient has had a recent decline in their functional status and demonstrates the ability to make significant improvements in function in a reasonable and predictable amount of time.  ? ?  ?Precautions / Restrictions  Precautions ?Precautions: Fall;Posterior Hip ?Precaution Comments: reviewed posterior hip precautions ?Restrictions ?RLE Weight Bearing: Weight bearing as tolerated  ? ?  ? ?Mobility ? Bed Mobility ?Overal bed mobility: Needs Assistance ?Bed Mobility: Supine to Sit ?  ?  ?Supine to sit: Supervision ?  ?  ?  ?  ? ?Transfers ?Overall transfer level: Needs assistance ?Equipment used: Rolling walker (2 wheels) ?Transfers: Sit to/from Stand ?Sit to Stand: Min guard ?  ?Step pivot transfers: Min guard ?  ?  ?  ?General transfer comment: transferred bed to 3 in 1, VCs hand placement ?  ? ?Ambulation/Gait ?Ambulation/Gait assistance: Min guard ?Gait Distance (Feet): 4 Feet ?Assistive device: Rolling walker (2 wheels) ?Gait Pattern/deviations: Decreased step length - right, Decreased step length - left, Decreased stance time - right, Decreased stance time - left, Decreased stride length, Antalgic, Shuffle ?Gait velocity: decr ?  ?  ?General Gait Details: distance limited by fatigue ? ?Stairs ?  ?  ?  ?  ?  ? ?Wheelchair Mobility ?  ? ?Modified Rankin (Stroke Patients Only) ?  ? ?  ? ?Balance Overall balance assessment: Needs assistance ?Sitting-balance support: Feet supported, No upper extremity supported ?Sitting balance-Leahy Scale: Fair ?  ?  ?Standing balance support: Reliant on assistive device for balance, During functional activity ?Standing balance-Leahy Scale: Poor ?Standing balance comment: using RW ?  ?  ?  ?  ?  ?  ?  ?  ?  ?  ?  ?   ? ? ? ?Pertinent Vitals/Pain Pain Assessment ?Faces Pain Scale: Hurts little more ?Pain Location: abdomen/bladder ?Pain Descriptors / Indicators: Burning, Pressure ?Pain Intervention(s): Limited activity within patient's tolerance, Monitored during session, Premedicated before session  ? ? ?Home Living Family/patient expects to be discharged to:: Private residence ?Living Arrangements: Other relatives ?Available Help  at Discharge: Family;Available PRN/intermittently ?Type of Home:  House ?Home Access: Stairs to enter ?Entrance Stairs-Rails: None ?Entrance Stairs-Number of Steps: 2 ?  ?Home Layout: One level ?Home Equipment: Conservation officer, nature (2 wheels);Rollator (4 wheels);Other (comment);BSC/3in1;Grab bars - toilet;Grab bars - tub/shower;Shower seat ?   ?  ?Prior Function Prior Level of Function : Needs assist ?  ?  ?  ?  ?  ?  ?Mobility Comments: Prior to hip fx, Pt reportedly can mobilize within home using RW without assist. Assist needed in community using stairs with pt reporting rollator use when out of house. Pt reportedly drives. At SNF, pt was only taking a few steps with ambulation. ?ADLs Comments: Prior to hip fx, Pt's niece assists with bathing. Pt reported independence with all other ADL's. Niece assists with IADL's. ?  ? ? ?Hand Dominance  ? Dominant Hand: Right ? ?  ?Extremity/Trunk Assessment  ? Upper Extremity Assessment ?Upper Extremity Assessment: Defer to OT evaluation ?  ? ?Lower Extremity Assessment ?Lower Extremity Assessment: LLE deficits/detail ?RLE Deficits / Details: grossly 3+/5 ?LLE Deficits / Details: grossly -4/5 except ankle doriflexion -3/5 (baseline due to old back surgery per patient), decr to light touch medial foot ?LLE Sensation: decreased proprioception;decreased light touch ?  ? ?Cervical / Trunk Assessment ?Cervical / Trunk Assessment: Normal  ?Communication  ? Communication: No difficulties  ?Cognition Arousal/Alertness: Awake/alert ?Behavior During Therapy: [facility] for tasks assessed/performed ?Overall Cognitive Status: Within Functional Limits for tasks assessed ?  ?  ?  ?  ?  ?  ?  ?  ?  ?  ?  ?  ?  ?  ?  ?  ?  ?  ?  ? ?  ?General Comments   ? ?  ?Exercises General Exercises - Upper Extremity ?Shoulder Flexion: AROM, Both, 10 reps, Seated ?General Exercises - Lower Extremity ?Ankle Circles/Pumps: AROM, Both, 10 reps, Seated ?Quad Sets: AROM, Both, 5 reps, Supine ?Long Arc Quad: AROM, Both, 10 reps, Seated ?Heel Slides: AAROM, Right, 10 reps, Supine ?Hip  ABduction/ADduction: AAROM, Right, 10 reps, Supine  ? ?Assessment/Plan  ?  ?PT Assessment Patient needs continued PT services  ?PT Problem List Decreased strength;Decreased activity tolerance;Decreased balance;Decreased mobility ? ?   ?  ?PT Treatment Interventions DME instruction;Gait training;Stair training;Functional mobility training;Therapeutic activities;Balance training;Patient/family education   ? ?PT Goals (Current goals can be found in the Care Plan section)  ?Acute Rehab PT Goals ?Patient Stated Goal: return home after rehab ?PT Goal Formulation: With patient/family ?Time For Goal Achievement: 08/20/21 ?Potential to Achieve Goals: Good ? ?  ?Frequency Min 3X/week ?  ? ? ?Co-evaluation   ?  ?  ?  ?  ? ? ?  ?AM-PAC PT "6 Clicks" Mobility  ?Outcome Measure Help needed turning from your back to your side while in a flat bed without using bedrails?: A Little ?Help needed moving from lying on your back to sitting on the side of a flat bed without using bedrails?: A Little ?Help needed moving to and from a bed to a chair (including a wheelchair)?: A Little ?Help needed standing up from a chair using your arms (e.g., wheelchair or bedside chair)?: A Little ?Help needed to walk in hospital room?: A Little ?Help needed climbing 3-5 steps with a railing? : Total ?6 Click Score: 16 ? ?  ?End of Session Equipment Utilized During Treatment: Gait belt ?Activity Tolerance: Patient limited by fatigue ?Patient left: in chair;with call bell/phone within reach;with chair alarm set;with family/visitor present ?Nurse Communication:  Mobility status ?PT Visit Diagnosis: Unsteadiness on feet (R26.81);Other abnormalities of gait and mobility (R26.89);Muscle weakness (generalized) (M62.81) ?  ? ?Time: 6484-7207 ?PT Time Calculation (min) (ACUTE ONLY): 19 min ? ? ?Charges:   PT Evaluation ?$PT Eval Moderate Complexity: 1 Mod ?  ?  ?   ? ?[name] PT 08/23/2021  ?Acute Rehabilitation Services ?Pager  [phone] ?Office [phone] ? ? ?

## 2021-08-26 ENCOUNTER — Other Ambulatory Visit: Payer: Self-pay

## 2021-08-26 ENCOUNTER — Emergency Department (HOSPITAL_COMMUNITY)
Admission: EM | Admit: 2021-08-26 | Discharge: 2021-08-27 | Disposition: A | Discharge status: Skilled Nursing Facility | Payer: Medicare Other | Attending: Emergency Medicine | Admitting: Emergency Medicine

## 2021-08-26 ENCOUNTER — Encounter (HOSPITAL_COMMUNITY): Payer: Self-pay

## 2021-08-26 DIAGNOSIS — I119 Hypertensive heart disease without heart failure: Secondary | ICD-10-CM | POA: Diagnosis not present

## 2021-08-26 DIAGNOSIS — Z7901 Long term (current) use of anticoagulants: Secondary | ICD-10-CM | POA: Insufficient documentation

## 2021-08-26 DIAGNOSIS — N3001 Acute cystitis with hematuria: Secondary | ICD-10-CM | POA: Insufficient documentation

## 2021-08-26 DIAGNOSIS — D649 Anemia, unspecified: Secondary | ICD-10-CM | POA: Insufficient documentation

## 2021-08-26 DIAGNOSIS — R1084 Generalized abdominal pain: Secondary | ICD-10-CM | POA: Diagnosis not present

## 2021-08-26 DIAGNOSIS — I4891 Unspecified atrial fibrillation: Secondary | ICD-10-CM | POA: Diagnosis not present

## 2021-08-26 DIAGNOSIS — E119 Type 2 diabetes mellitus without complications: Secondary | ICD-10-CM | POA: Diagnosis not present

## 2021-08-26 DIAGNOSIS — R300 Dysuria: Secondary | ICD-10-CM | POA: Diagnosis not present

## 2021-08-26 DIAGNOSIS — N390 Urinary tract infection, site not specified: Secondary | ICD-10-CM | POA: Diagnosis not present

## 2021-08-26 DIAGNOSIS — J9601 Acute respiratory failure with hypoxia: Secondary | ICD-10-CM | POA: Diagnosis not present

## 2021-08-26 DIAGNOSIS — R1030 Lower abdominal pain, unspecified: Secondary | ICD-10-CM | POA: Diagnosis not present

## 2021-08-26 DIAGNOSIS — E039 Hypothyroidism, unspecified: Secondary | ICD-10-CM | POA: Diagnosis not present

## 2021-08-26 DIAGNOSIS — R102 Pelvic and perineal pain: Secondary | ICD-10-CM

## 2021-08-26 DIAGNOSIS — M6281 Muscle weakness (generalized): Secondary | ICD-10-CM | POA: Diagnosis not present

## 2021-08-26 DIAGNOSIS — Z743 Need for continuous supervision: Secondary | ICD-10-CM | POA: Diagnosis not present

## 2021-08-26 DIAGNOSIS — M80051D Age-related osteoporosis with current pathological fracture, right femur, subsequent encounter for fracture with routine healing: Secondary | ICD-10-CM | POA: Diagnosis not present

## 2021-08-26 DIAGNOSIS — Z9181 History of falling: Secondary | ICD-10-CM | POA: Diagnosis not present

## 2021-08-26 DIAGNOSIS — R6889 Other general symptoms and signs: Secondary | ICD-10-CM | POA: Diagnosis not present

## 2021-08-26 NOTE — ED Provider Triage Note (Signed)
Emergency Medicine Provider Triage Evaluation Note ? ?Yolanda Franco , a 88 y.o. female  was evaluated in triage.  Pt complains of [facility], undergoing rehab after right hip surgery.  Hx of interstitial cystitis, feels like she is having a flare up.  Difficulty urinating today with bladder spasms.   ? ?Review of Systems  ?Positive: Bladder pain, dysuria ?Negative: fever ? ?Physical Exam  ?BP (!) 149/71 (BP Location: Right Arm)   Pulse 72   Temp 98 ?F (36.7 ?C) (Oral)   Resp 16   SpO2 96%  ? ?Gen:   Awake, no distress   ?Resp:  Normal effort  ?MSK:   Moves extremities without difficulty  ?Other:   ? ?Medical Decision Making  ?Medically screening exam initiated at 11:39 PM.  Appropriate orders placed.  BRITZY GRAUL was informed that the remainder of the evaluation will be completed by another provider, this initial triage assessment does not replace that evaluation, and the importance of remaining in the ED until their evaluation is complete. ? ?Bladder pain, dysuria.  History of interstitial cystitis and feels like she is having a flareup.  Afebrile in triage.  Will check labs, UA with culture. ?  ?Larene Pickett, PA-C ?08/26/21 2342 ? ?

## 2021-08-26 NOTE — ED Triage Notes (Signed)
Pt states she was dx with IC. Pt reports flare-up Pt is from [facility]. Pt reports painful urination.  ?

## 2021-08-27 DIAGNOSIS — E039 Hypothyroidism, unspecified: Secondary | ICD-10-CM | POA: Diagnosis not present

## 2021-08-27 DIAGNOSIS — N3010 Interstitial cystitis (chronic) without hematuria: Secondary | ICD-10-CM | POA: Diagnosis not present

## 2021-08-27 DIAGNOSIS — Z794 Long term (current) use of insulin: Secondary | ICD-10-CM | POA: Diagnosis not present

## 2021-08-27 DIAGNOSIS — K5900 Constipation, unspecified: Secondary | ICD-10-CM | POA: Diagnosis not present

## 2021-08-27 DIAGNOSIS — R8271 Bacteriuria: Secondary | ICD-10-CM | POA: Diagnosis not present

## 2021-08-27 DIAGNOSIS — D649 Anemia, unspecified: Secondary | ICD-10-CM | POA: Diagnosis not present

## 2021-08-27 DIAGNOSIS — S7291XA Unspecified fracture of right femur, initial encounter for closed fracture: Secondary | ICD-10-CM | POA: Diagnosis not present

## 2021-08-27 DIAGNOSIS — K6289 Other specified diseases of anus and rectum: Secondary | ICD-10-CM | POA: Diagnosis not present

## 2021-08-27 DIAGNOSIS — I480 Paroxysmal atrial fibrillation: Secondary | ICD-10-CM | POA: Diagnosis not present

## 2021-08-27 DIAGNOSIS — E119 Type 2 diabetes mellitus without complications: Secondary | ICD-10-CM | POA: Diagnosis not present

## 2021-08-27 DIAGNOSIS — I10 Essential (primary) hypertension: Secondary | ICD-10-CM | POA: Diagnosis not present

## 2021-08-27 LAB — CBC WITH DIFFERENTIAL/PLATELET
Abs Immature Granulocytes: 0.04 10*3/uL (ref 0.00–0.07)
Basophils Absolute: 0.1 10*3/uL (ref 0.0–0.1)
Basophils Relative: 1 %
Eosinophils Absolute: 0.1 10*3/uL (ref 0.0–0.5)
Eosinophils Relative: 1 %
HCT: 34.2 % — ABNORMAL LOW (ref 36.0–46.0)
Hemoglobin: 11.3 g/dL — ABNORMAL LOW (ref 12.0–15.0)
Immature Granulocytes: 0 %
Lymphocytes Relative: 16 %
Lymphs Abs: 1.4 10*3/uL (ref 0.7–4.0)
MCH: 29.8 pg (ref 26.0–34.0)
MCHC: 33 g/dL (ref 30.0–36.0)
MCV: 90.2 fL (ref 80.0–100.0)
Monocytes Absolute: 0.8 10*3/uL (ref 0.1–1.0)
Monocytes Relative: 9 %
Neutro Abs: 6.6 10*3/uL (ref 1.7–7.7)
Neutrophils Relative %: 73 %
Platelets: 318 10*3/uL (ref 150–400)
RBC: 3.79 MIL/uL — ABNORMAL LOW (ref 3.87–5.11)
RDW: 14.7 % (ref 11.5–15.5)
WBC: 8.9 10*3/uL (ref 4.0–10.5)
nRBC: 0 % (ref 0.0–0.2)

## 2021-08-27 LAB — URINALYSIS, ROUTINE W REFLEX MICROSCOPIC
Bilirubin Urine: NEGATIVE
Glucose, UA: NEGATIVE mg/dL
Ketones, ur: 5 mg/dL — AB
Nitrite: POSITIVE — AB
Protein, ur: 30 mg/dL — AB
Specific Gravity, Urine: 1.011 (ref 1.005–1.030)
pH: 7 (ref 5.0–8.0)

## 2021-08-27 LAB — COMPREHENSIVE METABOLIC PANEL
ALT: 9 U/L (ref 0–44)
AST: 29 U/L (ref 15–41)
Albumin: 3.1 g/dL — ABNORMAL LOW (ref 3.5–5.0)
Alkaline Phosphatase: 60 U/L (ref 38–126)
Anion gap: 9 (ref 5–15)
BUN: 7 mg/dL — ABNORMAL LOW (ref 8–23)
CO2: 21 mmol/L — ABNORMAL LOW (ref 22–32)
Calcium: 8.8 mg/dL — ABNORMAL LOW (ref 8.9–10.3)
Chloride: 105 mmol/L (ref 98–111)
Creatinine, Ser: 0.52 mg/dL (ref 0.44–1.00)
GFR, Estimated: >60 mL/min (ref >60)
Glucose, Bld: 114 mg/dL — ABNORMAL HIGH (ref 70–99)
Potassium: 3.9 mmol/L (ref 3.5–5.1)
Sodium: 135 mmol/L (ref 135–145)
Total Bilirubin: 1.2 mg/dL (ref 0.3–1.2)
Total Protein: 5.5 g/dL — ABNORMAL LOW (ref 6.5–8.1)

## 2021-08-27 MED ORDER — NITROFURANTOIN MACROCRYSTAL 50 MG PO CAPS
ORAL_CAPSULE | ORAL | 0 refills | Status: DC
Start: 2021-08-27 — End: 2021-10-09

## 2021-08-27 MED ORDER — SODIUM CHLORIDE 0.9 % IV SOLN
1.0000 g | Freq: Once | INTRAVENOUS | Status: AC
  Administered 2021-08-27: 1 g via INTRAVENOUS
  Filled 2021-08-27: qty 10

## 2021-08-27 MED ORDER — BACLOFEN 10 MG PO TABS
10.0000 mg | ORAL_TABLET | Freq: Two times a day (BID) | ORAL | 0 refills | Status: AC
Start: 2021-08-27 — End: 2021-09-26

## 2021-08-27 NOTE — Discharge Instructions (Addendum)
Follow-up with your urologist as previously arranged. ?Follow up urine culture in 2 days. ?Take antibiotics as prescribed and baclofen to help with pelvic/bladder pain. ?Return for persistent vomiting, persistent fevers or new concerns. ?For intermittent fevers and pain you can take Tylenol 1000 mg every 4 hours. ?

## 2021-08-27 NOTE — ED Notes (Signed)
Pt verbalizes understanding of discharge instructions. Opportunity for questions and answers were provided. Pt discharged from the ED to [facility] via [facility]. 

## 2021-08-27 NOTE — ED Notes (Signed)
Attempted report to [facility].  ?

## 2021-08-27 NOTE — ED Notes (Signed)
Attempted report to [facility] x3.  ?

## 2021-08-27 NOTE — ED Notes (Signed)
[facility] called at 9:17; ETA "within 45 min" ?

## 2021-08-27 NOTE — ED Provider Notes (Signed)
?[facility] ?Provider Note ? ? ?CSN: [id number] ?Arrival date & time: 08/26/21  2338 ? ?  ? ?History ? ?No chief complaint on file. ? ? ?Yolanda Franco is a 88 y.o. female. ? ?Patient from rehab facility due to recent right hip fracture and surgery approximately 2 weeks ago, history of interstitial cystitis flares presents with bladder pain and pain with urination that got worse since yesterday.  Feels similar to previous however more severe.  Patient has dysuria and suprapubic pain.  No fevers chills or vomiting.  Mild nausea.  Pain was severe but improved with fentanyl.  Patient takes Tylenol as needed for pain at the facility.  Patient has been on antibiotics before for this.  Patient has urologist however unable to get in till the end of the month. ?Patient son later arrived to provide further details regarding this is similar to previous flares and it improved with antibiotics in the past however now is getting worse.  Patient was on a regimen by urology in 2019 that helped her. ? ? ?  ? ?Home Medications ?Prior to Admission medications   ?Medication Sig Start Date End Date Taking? Authorizing Provider  ?baclofen (LIORESAL) 10 MG tablet Take 1 tablet (10 mg total) by mouth 2 (two) times daily. 08/27/21 09/26/21 Yes Elnora Morrison, MD  ?nitrofurantoin (MACRODANTIN) 50 MG capsule Use 100 mg (2 tablets) twice daily for 5 days then switch to 1 tablet nightly for the next two weeks until instructed otherwise by urology. 08/27/21  Yes Elnora Morrison, MD  ?acetaminophen (TYLENOL) 325 MG tablet Take 2 tablets (650 mg total) by mouth every 6 (six) hours as needed for mild pain. ?Patient taking differently: Take 650 mg by mouth 2 (two) times daily. 08/07/21 09/06/21  ShahmehdiValeria Batman, MD  ?amLODipine (NORVASC) 5 MG tablet Take 5 mg by mouth daily.    [provider]  ?apixaban (ELIQUIS) 2.5 MG TABS tablet Take 2.5 mg by mouth 2 (two) times daily.    [provider]   ?Azelastine HCl 137 MCG/SPRAY SOLN Place 1 spray into the nose 2 (two) times daily.    [provider]  ?feeding supplement (ENSURE ENLIVE / ENSURE PLUS) LIQD Take 237 mLs by mouth 2 (two) times daily between meals. ?Patient not taking: Reported on 08/21/2021 08/07/21   Deatra James, MD  ?ferrous gluconate (FERGON) 324 MG tablet Take 324 mg by mouth every Monday, Wednesday, and Friday.    [provider]  ?FLUoxetine (PROZAC) 40 MG capsule Take 40 mg by mouth daily.    [provider]  ?fluticasone (FLONASE) 50 MCG/ACT nasal spray Place 1 spray into both nostrils daily.    [provider]  ?Glucosamine 500 MG CAPS Take 500 mg by mouth every evening. 01/08/19   [provider]  ?guaiFENesin (MUCINEX) 600 MG 12 hr tablet Take 600 mg by mouth 2 (two) times daily.    [provider]  ?HYDROCORTISONE ACE, RECTAL, 30 MG SUPP Place 30 mg rectally 2 (two) times daily.    [provider]  ?insulin detemir (LEVEMIR) 100 UNIT/ML injection Inject 10 Units into the skin at bedtime.    [provider]  ?levothyroxine (SYNTHROID, LEVOTHROID) 25 MCG tablet Take 12.5 mcg by mouth daily before breakfast. 08/23/12   [provider]  ?lidocaine (LMX) 4 % cream Apply 1 application. topically 2 (two) times daily. Coat proctocort suppository thoroughly prior to placement.    [provider]  ?loratadine (CLARITIN)  10 MG tablet Take 10 mg by mouth daily.    [provider]  ?mirtazapine (REMERON) 15 MG tablet Take 15 mg by mouth at bedtime.    [provider]  ?Multiple Vitamin (MULTIVITAMIN WITH MINERALS) TABS tablet Take 1 tablet by mouth daily. 08/07/21   Shahmehdi, Valeria Batman, MD  ?polyethylene glycol powder (MIRALAX) 17 GM/SCOOP powder Take 17 g by mouth 2 (two) times daily as needed for mild constipation. 08/23/21   Mercy Riding, MD  ?senna-docusate (SENOKOT-S) 8.6-50 MG tablet Take 1 tablet by mouth 2 (two) times daily between  meals as needed for moderate constipation. 08/23/21   Mercy Riding, MD  ?sodium phosphate (FLEET) 7-19 GM/118ML ENEM Place 133 mLs (1 enema total) rectally daily as needed for severe constipation. 08/23/21   Mercy Riding, MD  ?traZODone (DESYREL) 150 MG tablet Take 150 mg by mouth at bedtime.    [provider]  ?   ? ?Allergies    ?Patient has no active allergies.   ? ?Review of Systems   ?Review of Systems  ?Constitutional:  Negative for chills and fever.  ?HENT:  Negative for congestion.   ?Eyes:  Negative for visual disturbance.  ?Respiratory:  Negative for shortness of breath.   ?Cardiovascular:  Negative for chest pain.  ?Gastrointestinal:  Positive for abdominal pain and nausea. Negative for vomiting.  ?Genitourinary:  Positive for dysuria. Negative for flank pain.  ?Musculoskeletal:  Negative for back pain, neck pain and neck stiffness.  ?Skin:  Negative for rash.  ?Neurological:  Negative for light-headedness and headaches.  ? ?Physical Exam ?Updated Vital Signs ?BP (!) 142/73 (BP Location: Right Arm)   Pulse 73   Temp 98.1 ?F (36.7 ?C) (Oral)   Resp 16   SpO2 96%  ?Physical Exam ?Vitals and nursing note reviewed.  ?Constitutional:   ?   General: She is not in acute distress. ?   Appearance: She is well-developed.  ?HENT:  ?   Head: Normocephalic and atraumatic.  ?   Mouth/Throat:  ?   Mouth: Mucous membranes are moist.  ?Eyes:  ?   General:     ?   Right eye: No discharge.     ?   Left eye: No discharge.  ?   Conjunctiva/sclera: Conjunctivae normal.  ?Neck:  ?   Trachea: No tracheal deviation.  ?Cardiovascular:  ?   Rate and Rhythm: Normal rate.  ?Pulmonary:  ?   Effort: Pulmonary effort is normal.  ?Abdominal:  ?   General: There is no distension.  ?   Palpations: Abdomen is soft.  ?   Tenderness: There is abdominal tenderness (suprapubic). There is no guarding.  ?Musculoskeletal:     ?   General: No swelling.  ?   Cervical back: Normal range of motion and neck supple. No rigidity.  ?Skin: ?    General: Skin is warm.  ?   Capillary Refill: Capillary refill takes less than 2 seconds.  ?   Findings: No rash.  ?Neurological:  ?   General: No focal deficit present.  ?   Mental Status: She is alert.  ?   Cranial Nerves: No cranial nerve deficit.  ?Psychiatric:     ?   Mood and Affect: Mood normal.  ? ? ?ED Results / Procedures / Treatments   ?Labs ?(all labs ordered are listed, but only abnormal results are displayed) ?Labs Reviewed  ?CBC WITH DIFFERENTIAL/PLATELET - Abnormal; Notable for the following components:  ?  Result Value  ? RBC 3.79 (*)   ? Hemoglobin 11.3 (*)   ? HCT 34.2 (*)   ? All other components within normal limits  ?COMPREHENSIVE METABOLIC PANEL - Abnormal; Notable for the following components:  ? CO2 21 (*)   ? Glucose, Bld 114 (*)   ? BUN 7 (*)   ? Calcium 8.8 (*)   ? Total Protein 5.5 (*)   ? Albumin 3.1 (*)   ? All other components within normal limits  ?URINALYSIS, ROUTINE W REFLEX MICROSCOPIC - Abnormal; Notable for the following components:  ? Color, Urine AMBER (*)   ? APPearance HAZY (*)   ? Hgb urine dipstick MODERATE (*)   ? Ketones, ur 5 (*)   ? Protein, ur 30 (*)   ? Nitrite POSITIVE (*)   ? Leukocytes,Ua SMALL (*)   ? Bacteria, UA MANY (*)   ? Non Squamous Epithelial 0-5 (*)   ? All other components within normal limits  ?URINE CULTURE  ? ? ?EKG ?None ? ?Radiology ?No results found. ? ?Procedures ?Procedures  ? ? ?Medications Ordered in ED ?Medications  ?cefTRIAXone (ROCEPHIN) 1 g in sodium chloride 0.9 % 100 mL IVPB (1 g Intravenous New Bag/Given 08/27/21 0832)  ? ? ?ED Course/ Medical Decision Making/ A&P ?  ?                        ?Medical Decision Making ?Risk ?Prescription drug management. ? ? ?Patient presents with clinical concern for acute cystitis/UTI given worsening symptoms.  Patient's pain improved after pain meds shortly after arrival.  Patient was comfortable and pain overall controlled when assessed by myself.  Other differentials considered including  malignancy, colitis, appendicitis, other however based on exam history of similar and strong symptoms plan to treat for acute cystitis/interstitial cystitis. ? ?Blood work ordered and reviewed showing reassuring labs

## 2021-08-27 NOTE — ED Notes (Signed)
Attempted report to [facility].  ?

## 2021-08-28 DIAGNOSIS — N3010 Interstitial cystitis (chronic) without hematuria: Secondary | ICD-10-CM | POA: Diagnosis not present

## 2021-08-28 DIAGNOSIS — N390 Urinary tract infection, site not specified: Secondary | ICD-10-CM | POA: Diagnosis not present

## 2021-08-28 DIAGNOSIS — E039 Hypothyroidism, unspecified: Secondary | ICD-10-CM | POA: Diagnosis not present

## 2021-08-28 DIAGNOSIS — E119 Type 2 diabetes mellitus without complications: Secondary | ICD-10-CM | POA: Diagnosis not present

## 2021-08-28 DIAGNOSIS — M80051D Age-related osteoporosis with current pathological fracture, right femur, subsequent encounter for fracture with routine healing: Secondary | ICD-10-CM | POA: Diagnosis not present

## 2021-08-28 DIAGNOSIS — Z9181 History of falling: Secondary | ICD-10-CM | POA: Diagnosis not present

## 2021-08-28 DIAGNOSIS — I4891 Unspecified atrial fibrillation: Secondary | ICD-10-CM | POA: Diagnosis not present

## 2021-08-28 DIAGNOSIS — I119 Hypertensive heart disease without heart failure: Secondary | ICD-10-CM | POA: Diagnosis not present

## 2021-08-28 DIAGNOSIS — R1084 Generalized abdominal pain: Secondary | ICD-10-CM | POA: Diagnosis not present

## 2021-08-28 DIAGNOSIS — M6281 Muscle weakness (generalized): Secondary | ICD-10-CM | POA: Diagnosis not present

## 2021-08-28 DIAGNOSIS — J9601 Acute respiratory failure with hypoxia: Secondary | ICD-10-CM | POA: Diagnosis not present

## 2021-08-29 DIAGNOSIS — N3010 Interstitial cystitis (chronic) without hematuria: Secondary | ICD-10-CM | POA: Diagnosis not present

## 2021-08-29 DIAGNOSIS — B372 Candidiasis of skin and nail: Secondary | ICD-10-CM | POA: Diagnosis not present

## 2021-08-29 DIAGNOSIS — I10 Essential (primary) hypertension: Secondary | ICD-10-CM | POA: Diagnosis not present

## 2021-08-29 LAB — URINE CULTURE: Culture: >=100000 — AB

## 2021-08-30 ENCOUNTER — Telehealth: Payer: Self-pay | Admitting: *Deleted

## 2021-08-30 DIAGNOSIS — I10 Essential (primary) hypertension: Secondary | ICD-10-CM | POA: Diagnosis not present

## 2021-08-30 DIAGNOSIS — K6289 Other specified diseases of anus and rectum: Secondary | ICD-10-CM | POA: Diagnosis not present

## 2021-08-30 DIAGNOSIS — I4891 Unspecified atrial fibrillation: Secondary | ICD-10-CM | POA: Diagnosis not present

## 2021-08-30 DIAGNOSIS — R634 Abnormal weight loss: Secondary | ICD-10-CM | POA: Diagnosis not present

## 2021-08-30 DIAGNOSIS — Z515 Encounter for palliative care: Secondary | ICD-10-CM | POA: Diagnosis not present

## 2021-08-30 DIAGNOSIS — S72002D Fracture of unspecified part of neck of left femur, subsequent encounter for closed fracture with routine healing: Secondary | ICD-10-CM | POA: Diagnosis not present

## 2021-08-30 DIAGNOSIS — N3010 Interstitial cystitis (chronic) without hematuria: Secondary | ICD-10-CM | POA: Diagnosis not present

## 2021-08-30 NOTE — Telephone Encounter (Signed)
Post ED Visit - Positive Culture Follow-up ? ?Culture report reviewed by antimicrobial stewardship pharmacist: ?[facility] Team ?'[]'$  Elenor Quinones, Pharm.D. ?'[]'$  Heide Guile, Pharm.D., BCPS AQ-ID ?'[]'$  Parks Neptune, Pharm.D., BCPS ?'[]'$  Alycia Rossetti, Pharm.D., BCPS ?'[]'$  [place], Pharm.D., BCPS, AAHIVP ?'[]'$  Legrand Como, Pharm.D., BCPS, AAHIVP ?'[]'$  Salome Arnt, PharmD, BCPS ?'[]'$  Johnnette Gourd, PharmD, BCPS ?'[]'$  Hughes Better, PharmD, BCPS ?'[]'$  Leeroy Cha, PharmD ?'[]'$  Laqueta Linden, PharmD, BCPS ?'[]'$  Albertina Parr, PharmD ? ?[facility] Team ?'[]'$  Leodis Sias, PharmD ?'[]'$  Lindell Spar, PharmD ?'[]'$  Royetta Asal, PharmD ?'[]'$  Graylin Shiver, Rph ?'[]'$  Rema Fendt) Glennon Mac, PharmD ?'[]'$  Arlyn Dunning, PharmD ?'[]'$  Netta Cedars, PharmD ?'[]'$  Dia Sitter, PharmD ?'[]'$  Leone Haven, PharmD ?'[]'$  Gretta Arab, PharmD ?'[]'$  Theodis Shove, PharmD ?'[]'$  Peggyann Juba, PharmD ?'[]'$  Reuel Boom, PharmD ? ? ?Positive urine culture ?Treated with Nitrofurantoin [place], organism sensitive to the same and no further patient follow-up is required at this time. Laurey Arrow, PharmD ? ?Ardeen Fillers ?08/30/2021, 9:32 AM ?  ?

## 2021-09-02 DIAGNOSIS — I4891 Unspecified atrial fibrillation: Secondary | ICD-10-CM | POA: Diagnosis not present

## 2021-09-02 DIAGNOSIS — R1084 Generalized abdominal pain: Secondary | ICD-10-CM | POA: Diagnosis not present

## 2021-09-02 DIAGNOSIS — N390 Urinary tract infection, site not specified: Secondary | ICD-10-CM | POA: Diagnosis not present

## 2021-09-02 DIAGNOSIS — J9601 Acute respiratory failure with hypoxia: Secondary | ICD-10-CM | POA: Diagnosis not present

## 2021-09-02 DIAGNOSIS — E119 Type 2 diabetes mellitus without complications: Secondary | ICD-10-CM | POA: Diagnosis not present

## 2021-09-02 DIAGNOSIS — E039 Hypothyroidism, unspecified: Secondary | ICD-10-CM | POA: Diagnosis not present

## 2021-09-02 DIAGNOSIS — M6281 Muscle weakness (generalized): Secondary | ICD-10-CM | POA: Diagnosis not present

## 2021-09-02 DIAGNOSIS — I119 Hypertensive heart disease without heart failure: Secondary | ICD-10-CM | POA: Diagnosis not present

## 2021-09-02 DIAGNOSIS — M80051D Age-related osteoporosis with current pathological fracture, right femur, subsequent encounter for fracture with routine healing: Secondary | ICD-10-CM | POA: Diagnosis not present

## 2021-09-02 DIAGNOSIS — Z9181 History of falling: Secondary | ICD-10-CM | POA: Diagnosis not present

## 2021-09-02 DIAGNOSIS — N3010 Interstitial cystitis (chronic) without hematuria: Secondary | ICD-10-CM | POA: Diagnosis not present

## 2021-09-04 DIAGNOSIS — J9601 Acute respiratory failure with hypoxia: Secondary | ICD-10-CM | POA: Diagnosis not present

## 2021-09-04 DIAGNOSIS — I4891 Unspecified atrial fibrillation: Secondary | ICD-10-CM | POA: Diagnosis not present

## 2021-09-04 DIAGNOSIS — M80051D Age-related osteoporosis with current pathological fracture, right femur, subsequent encounter for fracture with routine healing: Secondary | ICD-10-CM | POA: Diagnosis not present

## 2021-09-04 DIAGNOSIS — M6281 Muscle weakness (generalized): Secondary | ICD-10-CM | POA: Diagnosis not present

## 2021-09-04 DIAGNOSIS — Z9181 History of falling: Secondary | ICD-10-CM | POA: Diagnosis not present

## 2021-09-04 DIAGNOSIS — E039 Hypothyroidism, unspecified: Secondary | ICD-10-CM | POA: Diagnosis not present

## 2021-09-04 DIAGNOSIS — N3010 Interstitial cystitis (chronic) without hematuria: Secondary | ICD-10-CM | POA: Diagnosis not present

## 2021-09-04 DIAGNOSIS — I119 Hypertensive heart disease without heart failure: Secondary | ICD-10-CM | POA: Diagnosis not present

## 2021-09-04 DIAGNOSIS — E119 Type 2 diabetes mellitus without complications: Secondary | ICD-10-CM | POA: Diagnosis not present

## 2021-09-04 DIAGNOSIS — R1084 Generalized abdominal pain: Secondary | ICD-10-CM | POA: Diagnosis not present

## 2021-09-04 DIAGNOSIS — N390 Urinary tract infection, site not specified: Secondary | ICD-10-CM | POA: Diagnosis not present

## 2021-09-10 DIAGNOSIS — S72001D Fracture of unspecified part of neck of right femur, subsequent encounter for closed fracture with routine healing: Secondary | ICD-10-CM | POA: Diagnosis not present

## 2021-09-10 DIAGNOSIS — R262 Difficulty in walking, not elsewhere classified: Secondary | ICD-10-CM | POA: Diagnosis not present

## 2021-09-10 DIAGNOSIS — M6281 Muscle weakness (generalized): Secondary | ICD-10-CM | POA: Diagnosis not present

## 2021-09-10 DIAGNOSIS — R2689 Other abnormalities of gait and mobility: Secondary | ICD-10-CM | POA: Diagnosis not present

## 2021-09-11 DIAGNOSIS — R262 Difficulty in walking, not elsewhere classified: Secondary | ICD-10-CM | POA: Diagnosis not present

## 2021-09-11 DIAGNOSIS — R2689 Other abnormalities of gait and mobility: Secondary | ICD-10-CM | POA: Diagnosis not present

## 2021-09-11 DIAGNOSIS — S72001D Fracture of unspecified part of neck of right femur, subsequent encounter for closed fracture with routine healing: Secondary | ICD-10-CM | POA: Diagnosis not present

## 2021-09-11 DIAGNOSIS — M6281 Muscle weakness (generalized): Secondary | ICD-10-CM | POA: Diagnosis not present

## 2021-09-12 DIAGNOSIS — R262 Difficulty in walking, not elsewhere classified: Secondary | ICD-10-CM | POA: Diagnosis not present

## 2021-09-12 DIAGNOSIS — M6281 Muscle weakness (generalized): Secondary | ICD-10-CM | POA: Diagnosis not present

## 2021-09-12 DIAGNOSIS — S72001D Fracture of unspecified part of neck of right femur, subsequent encounter for closed fracture with routine healing: Secondary | ICD-10-CM | POA: Diagnosis not present

## 2021-09-12 DIAGNOSIS — R2689 Other abnormalities of gait and mobility: Secondary | ICD-10-CM | POA: Diagnosis not present

## 2021-09-13 DIAGNOSIS — M6281 Muscle weakness (generalized): Secondary | ICD-10-CM | POA: Diagnosis not present

## 2021-09-13 DIAGNOSIS — S72001D Fracture of unspecified part of neck of right femur, subsequent encounter for closed fracture with routine healing: Secondary | ICD-10-CM | POA: Diagnosis not present

## 2021-09-13 DIAGNOSIS — R2689 Other abnormalities of gait and mobility: Secondary | ICD-10-CM | POA: Diagnosis not present

## 2021-09-13 DIAGNOSIS — R262 Difficulty in walking, not elsewhere classified: Secondary | ICD-10-CM | POA: Diagnosis not present

## 2021-09-16 DIAGNOSIS — M6281 Muscle weakness (generalized): Secondary | ICD-10-CM | POA: Diagnosis not present

## 2021-09-16 DIAGNOSIS — J9601 Acute respiratory failure with hypoxia: Secondary | ICD-10-CM | POA: Diagnosis not present

## 2021-09-16 DIAGNOSIS — R262 Difficulty in walking, not elsewhere classified: Secondary | ICD-10-CM | POA: Diagnosis not present

## 2021-09-16 DIAGNOSIS — R2689 Other abnormalities of gait and mobility: Secondary | ICD-10-CM | POA: Diagnosis not present

## 2021-09-16 DIAGNOSIS — N390 Urinary tract infection, site not specified: Secondary | ICD-10-CM | POA: Diagnosis not present

## 2021-09-16 DIAGNOSIS — M80051D Age-related osteoporosis with current pathological fracture, right femur, subsequent encounter for fracture with routine healing: Secondary | ICD-10-CM | POA: Diagnosis not present

## 2021-09-16 DIAGNOSIS — I4891 Unspecified atrial fibrillation: Secondary | ICD-10-CM | POA: Diagnosis not present

## 2021-09-16 DIAGNOSIS — R1084 Generalized abdominal pain: Secondary | ICD-10-CM | POA: Diagnosis not present

## 2021-09-16 DIAGNOSIS — Z9181 History of falling: Secondary | ICD-10-CM | POA: Diagnosis not present

## 2021-09-16 DIAGNOSIS — E039 Hypothyroidism, unspecified: Secondary | ICD-10-CM | POA: Diagnosis not present

## 2021-09-16 DIAGNOSIS — E119 Type 2 diabetes mellitus without complications: Secondary | ICD-10-CM | POA: Diagnosis not present

## 2021-09-16 DIAGNOSIS — N3010 Interstitial cystitis (chronic) without hematuria: Secondary | ICD-10-CM | POA: Diagnosis not present

## 2021-09-16 DIAGNOSIS — I119 Hypertensive heart disease without heart failure: Secondary | ICD-10-CM | POA: Diagnosis not present

## 2021-09-16 DIAGNOSIS — S72001D Fracture of unspecified part of neck of right femur, subsequent encounter for closed fracture with routine healing: Secondary | ICD-10-CM | POA: Diagnosis not present

## 2021-09-20 ENCOUNTER — Encounter: Payer: Medicare Other | Admitting: Orthopedic Surgery

## 2021-09-20 DIAGNOSIS — K635 Polyp of colon: Secondary | ICD-10-CM | POA: Diagnosis not present

## 2021-09-20 DIAGNOSIS — E119 Type 2 diabetes mellitus without complications: Secondary | ICD-10-CM | POA: Diagnosis not present

## 2021-09-20 DIAGNOSIS — K6289 Other specified diseases of anus and rectum: Secondary | ICD-10-CM | POA: Diagnosis not present

## 2021-09-20 DIAGNOSIS — I10 Essential (primary) hypertension: Secondary | ICD-10-CM | POA: Diagnosis not present

## 2021-09-20 DIAGNOSIS — E039 Hypothyroidism, unspecified: Secondary | ICD-10-CM | POA: Diagnosis not present

## 2021-09-20 DIAGNOSIS — N3010 Interstitial cystitis (chronic) without hematuria: Secondary | ICD-10-CM | POA: Diagnosis not present

## 2021-09-20 DIAGNOSIS — I480 Paroxysmal atrial fibrillation: Secondary | ICD-10-CM | POA: Diagnosis not present

## 2021-09-20 DIAGNOSIS — D649 Anemia, unspecified: Secondary | ICD-10-CM | POA: Diagnosis not present

## 2021-09-20 DIAGNOSIS — S7291XD Unspecified fracture of right femur, subsequent encounter for closed fracture with routine healing: Secondary | ICD-10-CM | POA: Diagnosis not present

## 2021-09-24 ENCOUNTER — Encounter: Payer: Medicare Other | Admitting: Orthopedic Surgery

## 2021-09-24 DIAGNOSIS — I10 Essential (primary) hypertension: Secondary | ICD-10-CM | POA: Diagnosis not present

## 2021-09-24 DIAGNOSIS — I480 Paroxysmal atrial fibrillation: Secondary | ICD-10-CM | POA: Diagnosis not present

## 2021-09-24 DIAGNOSIS — S72001D Fracture of unspecified part of neck of right femur, subsequent encounter for closed fracture with routine healing: Secondary | ICD-10-CM | POA: Diagnosis not present

## 2021-09-24 DIAGNOSIS — M170 Bilateral primary osteoarthritis of knee: Secondary | ICD-10-CM | POA: Diagnosis not present

## 2021-09-24 DIAGNOSIS — F32A Depression, unspecified: Secondary | ICD-10-CM | POA: Diagnosis not present

## 2021-09-24 DIAGNOSIS — E119 Type 2 diabetes mellitus without complications: Secondary | ICD-10-CM | POA: Diagnosis not present

## 2021-09-26 DIAGNOSIS — M170 Bilateral primary osteoarthritis of knee: Secondary | ICD-10-CM | POA: Diagnosis not present

## 2021-09-26 DIAGNOSIS — S72001D Fracture of unspecified part of neck of right femur, subsequent encounter for closed fracture with routine healing: Secondary | ICD-10-CM | POA: Diagnosis not present

## 2021-09-26 DIAGNOSIS — I480 Paroxysmal atrial fibrillation: Secondary | ICD-10-CM | POA: Diagnosis not present

## 2021-09-26 DIAGNOSIS — F32A Depression, unspecified: Secondary | ICD-10-CM | POA: Diagnosis not present

## 2021-09-26 DIAGNOSIS — I10 Essential (primary) hypertension: Secondary | ICD-10-CM | POA: Diagnosis not present

## 2021-09-26 DIAGNOSIS — E119 Type 2 diabetes mellitus without complications: Secondary | ICD-10-CM | POA: Diagnosis not present

## 2021-10-01 ENCOUNTER — Telehealth: Payer: Self-pay | Admitting: Radiology

## 2021-10-01 ENCOUNTER — Encounter: Payer: Medicare Other | Admitting: Orthopedic Surgery

## 2021-10-01 DIAGNOSIS — Z87440 Personal history of urinary (tract) infections: Secondary | ICD-10-CM | POA: Diagnosis not present

## 2021-10-01 DIAGNOSIS — R82998 Other abnormal findings in urine: Secondary | ICD-10-CM | POA: Diagnosis not present

## 2021-10-01 DIAGNOSIS — M6289 Other specified disorders of muscle: Secondary | ICD-10-CM | POA: Diagnosis not present

## 2021-10-01 DIAGNOSIS — N3010 Interstitial cystitis (chronic) without hematuria: Secondary | ICD-10-CM | POA: Diagnosis not present

## 2021-10-01 DIAGNOSIS — R800 Isolated proteinuria: Secondary | ICD-10-CM | POA: Diagnosis not present

## 2021-10-01 NOTE — Telephone Encounter (Signed)
Melissa called [facility] asking for orders for patient 1 W 6, for ADL's, strengthening, balance, endurance, functional mobility, training and safety.  Please call her [phone] to advise/confirm orders are ok. ?

## 2021-10-02 DIAGNOSIS — E119 Type 2 diabetes mellitus without complications: Secondary | ICD-10-CM | POA: Diagnosis not present

## 2021-10-02 DIAGNOSIS — F32A Depression, unspecified: Secondary | ICD-10-CM | POA: Diagnosis not present

## 2021-10-02 DIAGNOSIS — I10 Essential (primary) hypertension: Secondary | ICD-10-CM | POA: Diagnosis not present

## 2021-10-02 DIAGNOSIS — S72001D Fracture of unspecified part of neck of right femur, subsequent encounter for closed fracture with routine healing: Secondary | ICD-10-CM | POA: Diagnosis not present

## 2021-10-02 DIAGNOSIS — K5901 Slow transit constipation: Secondary | ICD-10-CM | POA: Diagnosis not present

## 2021-10-02 DIAGNOSIS — I480 Paroxysmal atrial fibrillation: Secondary | ICD-10-CM | POA: Diagnosis not present

## 2021-10-02 DIAGNOSIS — M170 Bilateral primary osteoarthritis of knee: Secondary | ICD-10-CM | POA: Diagnosis not present

## 2021-10-02 DIAGNOSIS — D508 Other iron deficiency anemias: Secondary | ICD-10-CM | POA: Diagnosis not present

## 2021-10-04 ENCOUNTER — Ambulatory Visit (INDEPENDENT_AMBULATORY_CARE_PROVIDER_SITE_OTHER): Payer: Medicare Other | Admitting: Orthopedic Surgery

## 2021-10-04 ENCOUNTER — Encounter: Payer: Self-pay | Admitting: Orthopedic Surgery

## 2021-10-04 ENCOUNTER — Ambulatory Visit (INDEPENDENT_AMBULATORY_CARE_PROVIDER_SITE_OTHER): Payer: Medicare Other

## 2021-10-04 VITALS — Ht 65.0 in | Wt 130.0 lb

## 2021-10-04 DIAGNOSIS — M170 Bilateral primary osteoarthritis of knee: Secondary | ICD-10-CM | POA: Diagnosis not present

## 2021-10-04 DIAGNOSIS — S72001D Fracture of unspecified part of neck of right femur, subsequent encounter for closed fracture with routine healing: Secondary | ICD-10-CM

## 2021-10-04 DIAGNOSIS — I480 Paroxysmal atrial fibrillation: Secondary | ICD-10-CM | POA: Diagnosis not present

## 2021-10-04 DIAGNOSIS — F32A Depression, unspecified: Secondary | ICD-10-CM | POA: Diagnosis not present

## 2021-10-04 DIAGNOSIS — Z96649 Presence of unspecified artificial hip joint: Secondary | ICD-10-CM

## 2021-10-04 DIAGNOSIS — E119 Type 2 diabetes mellitus without complications: Secondary | ICD-10-CM | POA: Diagnosis not present

## 2021-10-04 DIAGNOSIS — I10 Essential (primary) hypertension: Secondary | ICD-10-CM | POA: Diagnosis not present

## 2021-10-04 NOTE — Progress Notes (Signed)
Orthopaedic Postop Note ? ?Assessment: ?Yolanda Franco is a 88 y.o. female s/p Right hip hemiarthroplasty ? ?DOS: 08/05/21 ? ?Plan: ?She is recovering well. ?No pain in her right hip. ?She is able to maintain a straight leg raise. ?Urged her to continue ambulating, with the assistance of a walker. ?Continue working with PT. ?Provided reassurance, that she will continue to see improvement from 2-year following surgery. ?Follow-up in 3 months for repeat evaluation. ? ?Follow-up: ?No follow-ups on file. ?XR at next visit: AP pelvis and Right hip ? ?Subjective: ? ?Chief Complaint  ?Patient presents with  ? Routine Post Op  ?  Rt hip DOS 08/05/21  ? ? ?History of Present Illness: ?Yolanda Franco is a 88 y.o. female who presents following the above stated procedure.  Surgery was 2 months ago.  She is recovering well.  She has no pain in her right hip.  She has been dealing with other medical issues, which have slowed down her therapy, and her overall recovery.  No issues with her surgical incisions.  She denies numbness or tingling.  She continues work with physical therapy in the facility.  She is ambulating with the assistance of a walker. ? ?Review of Systems: ?No fevers or chills ?No numbness or tingling ?No Chest Pain ?No shortness of breath ? ? ?Objective: ?Ht '5\' 5"'$  (1.651 m)   Wt 130 lb (59 kg)   BMI 21.63 kg/m?  ? ?Physical Exam: ? ?Alert and oriented.  No acute distress.  Seated in a wheelchair. ? ?Surgical incision is healing well.  No surrounding erythema or drainage.  She tolerates gentle range of motion of the right hip.  She is able to maintain a straight leg raise.  Sensations intact over the dorsum of her foot.  Active motion intact in the TA/EHL. ? ?IMAGING: ?I personally ordered and reviewed the following images: ? ?AP pelvis and right hip x-rays obtained in clinic today.  Patient demonstrates a right hip hemiarthroplasty in stable position.  No evidence of hardware failure or loosening.  No  subsidence of the prosthesis.  No lucencies around the prosthesis.  No acute injuries are noted. ? ?Impression: Right hip hemiarthroplasty in stable condition. ? ? ?Mordecai Rasmussen, MD ?10/04/2021 ?12:08 PM ? ? ?

## 2021-10-07 DIAGNOSIS — E119 Type 2 diabetes mellitus without complications: Secondary | ICD-10-CM | POA: Diagnosis not present

## 2021-10-07 DIAGNOSIS — K6289 Other specified diseases of anus and rectum: Secondary | ICD-10-CM | POA: Diagnosis not present

## 2021-10-07 DIAGNOSIS — F32A Depression, unspecified: Secondary | ICD-10-CM | POA: Diagnosis not present

## 2021-10-07 DIAGNOSIS — S72001D Fracture of unspecified part of neck of right femur, subsequent encounter for closed fracture with routine healing: Secondary | ICD-10-CM | POA: Diagnosis not present

## 2021-10-07 DIAGNOSIS — M170 Bilateral primary osteoarthritis of knee: Secondary | ICD-10-CM | POA: Diagnosis not present

## 2021-10-07 DIAGNOSIS — R197 Diarrhea, unspecified: Secondary | ICD-10-CM | POA: Diagnosis not present

## 2021-10-07 DIAGNOSIS — N3010 Interstitial cystitis (chronic) without hematuria: Secondary | ICD-10-CM | POA: Diagnosis not present

## 2021-10-07 DIAGNOSIS — I10 Essential (primary) hypertension: Secondary | ICD-10-CM | POA: Diagnosis not present

## 2021-10-07 DIAGNOSIS — I480 Paroxysmal atrial fibrillation: Secondary | ICD-10-CM | POA: Diagnosis not present

## 2021-10-09 ENCOUNTER — Emergency Department (HOSPITAL_COMMUNITY): Payer: Medicare Other

## 2021-10-09 ENCOUNTER — Inpatient Hospital Stay (HOSPITAL_COMMUNITY)
Admission: EM | Admit: 2021-10-09 | Discharge: 2021-10-16 | DRG: 375 | Payer: Medicare Other | Source: Skilled Nursing Facility | Attending: Family Medicine | Admitting: Family Medicine

## 2021-10-09 ENCOUNTER — Other Ambulatory Visit: Payer: Self-pay

## 2021-10-09 ENCOUNTER — Encounter (HOSPITAL_COMMUNITY): Payer: Self-pay

## 2021-10-09 DIAGNOSIS — R159 Full incontinence of feces: Secondary | ICD-10-CM | POA: Diagnosis not present

## 2021-10-09 DIAGNOSIS — C7800 Secondary malignant neoplasm of unspecified lung: Secondary | ICD-10-CM | POA: Diagnosis present

## 2021-10-09 DIAGNOSIS — E559 Vitamin D deficiency, unspecified: Secondary | ICD-10-CM | POA: Insufficient documentation

## 2021-10-09 DIAGNOSIS — Z79899 Other long term (current) drug therapy: Secondary | ICD-10-CM

## 2021-10-09 DIAGNOSIS — Z681 Body mass index (BMI) 19 or less, adult: Secondary | ICD-10-CM | POA: Diagnosis not present

## 2021-10-09 DIAGNOSIS — C20 Malignant neoplasm of rectum: Principal | ICD-10-CM | POA: Diagnosis present

## 2021-10-09 DIAGNOSIS — D72829 Elevated white blood cell count, unspecified: Secondary | ICD-10-CM | POA: Diagnosis present

## 2021-10-09 DIAGNOSIS — L8990 Pressure ulcer of unspecified site, unspecified stage: Secondary | ICD-10-CM | POA: Insufficient documentation

## 2021-10-09 DIAGNOSIS — Z515 Encounter for palliative care: Secondary | ICD-10-CM

## 2021-10-09 DIAGNOSIS — R627 Adult failure to thrive: Secondary | ICD-10-CM | POA: Diagnosis present

## 2021-10-09 DIAGNOSIS — I700 Atherosclerosis of aorta: Secondary | ICD-10-CM | POA: Diagnosis not present

## 2021-10-09 DIAGNOSIS — Z87440 Personal history of urinary (tract) infections: Secondary | ICD-10-CM

## 2021-10-09 DIAGNOSIS — C7972 Secondary malignant neoplasm of left adrenal gland: Secondary | ICD-10-CM | POA: Diagnosis present

## 2021-10-09 DIAGNOSIS — E440 Moderate protein-calorie malnutrition: Secondary | ICD-10-CM | POA: Diagnosis present

## 2021-10-09 DIAGNOSIS — Z96641 Presence of right artificial hip joint: Secondary | ICD-10-CM | POA: Diagnosis not present

## 2021-10-09 DIAGNOSIS — R152 Fecal urgency: Secondary | ICD-10-CM | POA: Diagnosis not present

## 2021-10-09 DIAGNOSIS — F419 Anxiety disorder, unspecified: Secondary | ICD-10-CM | POA: Diagnosis present

## 2021-10-09 DIAGNOSIS — K6289 Other specified diseases of anus and rectum: Secondary | ICD-10-CM | POA: Diagnosis not present

## 2021-10-09 DIAGNOSIS — I480 Paroxysmal atrial fibrillation: Secondary | ICD-10-CM | POA: Diagnosis not present

## 2021-10-09 DIAGNOSIS — C772 Secondary and unspecified malignant neoplasm of intra-abdominal lymph nodes: Secondary | ICD-10-CM | POA: Diagnosis not present

## 2021-10-09 DIAGNOSIS — I119 Hypertensive heart disease without heart failure: Secondary | ICD-10-CM | POA: Diagnosis not present

## 2021-10-09 DIAGNOSIS — E119 Type 2 diabetes mellitus without complications: Secondary | ICD-10-CM | POA: Diagnosis not present

## 2021-10-09 DIAGNOSIS — N3010 Interstitial cystitis (chronic) without hematuria: Secondary | ICD-10-CM | POA: Diagnosis not present

## 2021-10-09 DIAGNOSIS — Z794 Long term (current) use of insulin: Secondary | ICD-10-CM

## 2021-10-09 DIAGNOSIS — E039 Hypothyroidism, unspecified: Secondary | ICD-10-CM | POA: Diagnosis not present

## 2021-10-09 DIAGNOSIS — I10 Essential (primary) hypertension: Secondary | ICD-10-CM | POA: Diagnosis present

## 2021-10-09 DIAGNOSIS — S72001D Fracture of unspecified part of neck of right femur, subsequent encounter for closed fracture with routine healing: Secondary | ICD-10-CM | POA: Diagnosis not present

## 2021-10-09 DIAGNOSIS — Z91012 Allergy to eggs: Secondary | ICD-10-CM

## 2021-10-09 DIAGNOSIS — R109 Unspecified abdominal pain: Secondary | ICD-10-CM | POA: Diagnosis not present

## 2021-10-09 DIAGNOSIS — Z823 Family history of stroke: Secondary | ICD-10-CM

## 2021-10-09 DIAGNOSIS — R110 Nausea: Secondary | ICD-10-CM | POA: Diagnosis not present

## 2021-10-09 DIAGNOSIS — Z886 Allergy status to analgesic agent status: Secondary | ICD-10-CM

## 2021-10-09 DIAGNOSIS — Z885 Allergy status to narcotic agent status: Secondary | ICD-10-CM

## 2021-10-09 DIAGNOSIS — K219 Gastro-esophageal reflux disease without esophagitis: Secondary | ICD-10-CM | POA: Diagnosis present

## 2021-10-09 DIAGNOSIS — D649 Anemia, unspecified: Secondary | ICD-10-CM | POA: Diagnosis not present

## 2021-10-09 DIAGNOSIS — Z743 Need for continuous supervision: Secondary | ICD-10-CM | POA: Diagnosis not present

## 2021-10-09 DIAGNOSIS — Z66 Do not resuscitate: Secondary | ICD-10-CM | POA: Diagnosis not present

## 2021-10-09 DIAGNOSIS — R935 Abnormal findings on diagnostic imaging of other abdominal regions, including retroperitoneum: Secondary | ICD-10-CM

## 2021-10-09 DIAGNOSIS — Z7901 Long term (current) use of anticoagulants: Secondary | ICD-10-CM | POA: Insufficient documentation

## 2021-10-09 DIAGNOSIS — Z7401 Bed confinement status: Secondary | ICD-10-CM

## 2021-10-09 DIAGNOSIS — E871 Hypo-osmolality and hyponatremia: Secondary | ICD-10-CM | POA: Diagnosis not present

## 2021-10-09 DIAGNOSIS — E46 Unspecified protein-calorie malnutrition: Secondary | ICD-10-CM | POA: Diagnosis present

## 2021-10-09 DIAGNOSIS — Z90710 Acquired absence of both cervix and uterus: Secondary | ICD-10-CM | POA: Diagnosis not present

## 2021-10-09 DIAGNOSIS — R1310 Dysphagia, unspecified: Secondary | ICD-10-CM | POA: Diagnosis present

## 2021-10-09 DIAGNOSIS — K5900 Constipation, unspecified: Secondary | ICD-10-CM | POA: Diagnosis not present

## 2021-10-09 DIAGNOSIS — Z7189 Other specified counseling: Secondary | ICD-10-CM

## 2021-10-09 DIAGNOSIS — F32A Depression, unspecified: Secondary | ICD-10-CM | POA: Diagnosis not present

## 2021-10-09 DIAGNOSIS — G893 Neoplasm related pain (acute) (chronic): Secondary | ICD-10-CM | POA: Diagnosis not present

## 2021-10-09 DIAGNOSIS — R197 Diarrhea, unspecified: Secondary | ICD-10-CM | POA: Diagnosis not present

## 2021-10-09 DIAGNOSIS — Z8261 Family history of arthritis: Secondary | ICD-10-CM

## 2021-10-09 DIAGNOSIS — R262 Difficulty in walking, not elsewhere classified: Secondary | ICD-10-CM | POA: Diagnosis present

## 2021-10-09 DIAGNOSIS — Z79890 Hormone replacement therapy (postmenopausal): Secondary | ICD-10-CM

## 2021-10-09 HISTORY — DX: Atherosclerosis of aorta: I70.0

## 2021-10-09 LAB — CBC WITH DIFFERENTIAL/PLATELET
Abs Immature Granulocytes: 0.22 10*3/uL — ABNORMAL HIGH (ref 0.00–0.07)
Basophils Absolute: 0.1 10*3/uL (ref 0.0–0.1)
Basophils Relative: 0 %
Eosinophils Absolute: 0.5 10*3/uL (ref 0.0–0.5)
Eosinophils Relative: 3 %
HCT: 34.2 % — ABNORMAL LOW (ref 36.0–46.0)
Hemoglobin: 11 g/dL — ABNORMAL LOW (ref 12.0–15.0)
Immature Granulocytes: 1 %
Lymphocytes Relative: 6 %
Lymphs Abs: 0.9 10*3/uL (ref 0.7–4.0)
MCH: 29.2 pg (ref 26.0–34.0)
MCHC: 32.2 g/dL (ref 30.0–36.0)
MCV: 90.7 fL (ref 80.0–100.0)
Monocytes Absolute: 1.1 10*3/uL — ABNORMAL HIGH (ref 0.1–1.0)
Monocytes Relative: 7 %
Neutro Abs: 12.5 10*3/uL — ABNORMAL HIGH (ref 1.7–7.7)
Neutrophils Relative %: 83 %
Platelets: 312 10*3/uL (ref 150–400)
RBC: 3.77 MIL/uL — ABNORMAL LOW (ref 3.87–5.11)
RDW: 14.6 % (ref 11.5–15.5)
WBC: 15.2 10*3/uL — ABNORMAL HIGH (ref 4.0–10.5)
nRBC: 0 % (ref 0.0–0.2)

## 2021-10-09 LAB — GLUCOSE, CAPILLARY
Glucose-Capillary: 93 mg/dL (ref 70–99)
Glucose-Capillary: 123 mg/dL — ABNORMAL HIGH (ref 70–99)
Glucose-Capillary: 111 mg/dL — ABNORMAL HIGH (ref 70–99)

## 2021-10-09 LAB — COMPREHENSIVE METABOLIC PANEL
ALT: 23 U/L (ref 0–44)
AST: 40 U/L (ref 15–41)
Albumin: 2.5 g/dL — ABNORMAL LOW (ref 3.5–5.0)
Alkaline Phosphatase: 155 U/L — ABNORMAL HIGH (ref 38–126)
Anion gap: 6 (ref 5–15)
BUN: 9 mg/dL (ref 8–23)
CO2: 24 mmol/L (ref 22–32)
Calcium: 8.1 mg/dL — ABNORMAL LOW (ref 8.9–10.3)
Chloride: 102 mmol/L (ref 98–111)
Creatinine, Ser: 0.37 mg/dL — ABNORMAL LOW (ref 0.44–1.00)
GFR, Estimated: >60 mL/min (ref >60)
Glucose, Bld: 83 mg/dL (ref 70–99)
Potassium: 4.4 mmol/L (ref 3.5–5.1)
Sodium: 132 mmol/L — ABNORMAL LOW (ref 135–145)
Total Bilirubin: 0.7 mg/dL (ref 0.3–1.2)
Total Protein: 5.6 g/dL — ABNORMAL LOW (ref 6.5–8.1)

## 2021-10-09 LAB — URINALYSIS, ROUTINE W REFLEX MICROSCOPIC
Bilirubin Urine: NEGATIVE
Glucose, UA: NEGATIVE mg/dL
Hgb urine dipstick: NEGATIVE
Ketones, ur: NEGATIVE mg/dL
Leukocytes,Ua: NEGATIVE
Nitrite: NEGATIVE
Protein, ur: NEGATIVE mg/dL
Specific Gravity, Urine: 1.01 (ref 1.005–1.030)
pH: 8 (ref 5.0–8.0)

## 2021-10-09 LAB — LIPASE, BLOOD: Lipase: 22 U/L (ref 11–51)

## 2021-10-09 MED ORDER — ACETAMINOPHEN 650 MG RE SUPP: 650.0000 mg | Freq: Four times a day (QID) | RECTAL | Status: DC | PRN

## 2021-10-09 MED ORDER — ENSURE ENLIVE PO LIQD
237.0000 mL | Freq: Two times a day (BID) | ORAL | Status: DC
  Administered 2021-10-10: 237 mL via ORAL

## 2021-10-09 MED ORDER — TRAZODONE HCL 50 MG PO TABS
150.0000 mg | ORAL_TABLET | Freq: Every day | ORAL | Status: DC
  Administered 2021-10-09 – 2021-10-12 (×4): 150 mg via ORAL
  Filled 2021-10-09: qty 3
  Filled 2021-10-09 (×2): qty 1
  Filled 2021-10-09: qty 3

## 2021-10-09 MED ORDER — FENTANYL CITRATE PF 50 MCG/ML IJ SOSY
25.0000 ug | PREFILLED_SYRINGE | Freq: Once | INTRAMUSCULAR | Status: AC
  Administered 2021-10-09: 25 ug via INTRAVENOUS
  Filled 2021-10-09: qty 1

## 2021-10-09 MED ORDER — FLUOXETINE HCL 20 MG PO CAPS
40.0000 mg | ORAL_CAPSULE | Freq: Every day | ORAL | Status: DC
  Administered 2021-10-09 – 2021-10-13 (×5): 40 mg via ORAL
  Filled 2021-10-09 (×5): qty 2

## 2021-10-09 MED ORDER — OXYCODONE HCL 5 MG PO TABS
2.5000 mg | ORAL_TABLET | ORAL | Status: DC | PRN
  Administered 2021-10-09 (×2): 2.5 mg via ORAL
  Filled 2021-10-09 (×2): qty 1

## 2021-10-09 MED ORDER — ONDANSETRON HCL 4 MG/2ML IJ SOLN
4.0000 mg | Freq: Four times a day (QID) | INTRAMUSCULAR | Status: DC | PRN
  Administered 2021-10-10: 4 mg via INTRAVENOUS

## 2021-10-09 MED ORDER — SODIUM CHLORIDE 0.9 % IV BOLUS
500.0000 mL | Freq: Once | INTRAVENOUS | Status: AC
  Administered 2021-10-09: 500 mL via INTRAVENOUS

## 2021-10-09 MED ORDER — HYDROCORTISONE ACETATE 25 MG RE SUPP
25.0000 mg | Freq: Two times a day (BID) | RECTAL | Status: DC
  Administered 2021-10-11 – 2021-10-13 (×5): 25 mg via RECTAL
  Filled 2021-10-09 (×10): qty 1

## 2021-10-09 MED ORDER — PANTOPRAZOLE SODIUM 40 MG PO TBEC
40.0000 mg | DELAYED_RELEASE_TABLET | Freq: Every day | ORAL | Status: DC
  Administered 2021-10-09 – 2021-10-14 (×6): 40 mg via ORAL
  Filled 2021-10-09 (×6): qty 1

## 2021-10-09 MED ORDER — ONDANSETRON HCL 4 MG PO TABS: 4.0000 mg | ORAL_TABLET | Freq: Four times a day (QID) | ORAL | Status: DC | PRN

## 2021-10-09 MED ORDER — IOHEXOL 300 MG/ML  SOLN
100.0000 mL | Freq: Once | INTRAMUSCULAR | Status: AC | PRN
  Administered 2021-10-09: 100 mL via INTRAVENOUS

## 2021-10-09 MED ORDER — HYDROMORPHONE HCL 1 MG/ML IJ SOLN
0.5000 mg | INTRAMUSCULAR | Status: DC | PRN
  Administered 2021-10-09 – 2021-10-12 (×10): 0.5 mg via INTRAVENOUS
  Filled 2021-10-09 (×10): qty 0.5

## 2021-10-09 MED ORDER — INSULIN DETEMIR 100 UNIT/ML ~~LOC~~ SOLN
9.0000 [IU] | Freq: Every day | SUBCUTANEOUS | Status: DC
  Administered 2021-10-10 – 2021-10-12 (×3): 9 [IU] via SUBCUTANEOUS
  Filled 2021-10-09 (×5): qty 0.09

## 2021-10-09 MED ORDER — BACLOFEN 10 MG PO TABS
10.0000 mg | ORAL_TABLET | Freq: Two times a day (BID) | ORAL | Status: DC | PRN
  Administered 2021-10-09 – 2021-10-12 (×4): 10 mg via ORAL
  Filled 2021-10-09 (×5): qty 1

## 2021-10-09 MED ORDER — FERROUS GLUCONATE 324 (38 FE) MG PO TABS
324.0000 mg | ORAL_TABLET | ORAL | Status: DC
  Administered 2021-10-09 – 2021-10-11 (×2): 324 mg via ORAL
  Filled 2021-10-09 (×2): qty 1

## 2021-10-09 MED ORDER — ACETAMINOPHEN 325 MG PO TABS
650.0000 mg | ORAL_TABLET | Freq: Four times a day (QID) | ORAL | Status: DC | PRN
  Administered 2021-10-10 – 2021-10-11 (×2): 650 mg via ORAL
  Filled 2021-10-09 (×2): qty 2

## 2021-10-09 MED ORDER — AMLODIPINE BESYLATE 5 MG PO TABS
5.0000 mg | ORAL_TABLET | Freq: Every day | ORAL | Status: DC
  Administered 2021-10-09 – 2021-10-12 (×4): 5 mg via ORAL
  Filled 2021-10-09 (×4): qty 1

## 2021-10-09 MED ORDER — NYSTATIN 100000 UNIT/GM EX CREA
1.0000 "application " | TOPICAL_CREAM | Freq: Two times a day (BID) | CUTANEOUS | Status: DC
  Administered 2021-10-09 – 2021-10-16 (×14): 1 via TOPICAL
  Filled 2021-10-09 (×2): qty 30

## 2021-10-09 MED ORDER — PANTOPRAZOLE SODIUM 40 MG PO TBEC: 40.0000 mg | DELAYED_RELEASE_TABLET | Freq: Two times a day (BID) | ORAL | Status: DC

## 2021-10-09 MED ORDER — SODIUM CHLORIDE (PF) 0.9 % IJ SOLN
INTRAMUSCULAR | Status: AC
  Filled 2021-10-09: qty 50

## 2021-10-09 NOTE — ED Triage Notes (Signed)
Patient arrived from [place] with complaints of abdominal and rectal pain for years.  ?

## 2021-10-09 NOTE — ED Provider Notes (Signed)
88yo female with complaint of rectal pain, stopped laxities a few days ago. Also abdominal pain, poor PO intake and diarrhea. ?Seen at Dr. Johney Maine' office 2 days ago and advised to go to the hospital, felt needed GI work up for repeat colonoscopy ([place]) for concern for mass on CT.  ?Awaiting labs for dehydration, call GI. ? ?Physical Exam  ?BP (!) 135/57   Pulse 60   Temp 98.2 ?F (36.8 ?C) (Oral)   Resp (!) 30   SpO2 100%  ? ?Physical Exam ? ?Procedures  ?Procedures ? ?ED Course / MDM  ?  ?Medical Decision Making ?Amount and/or Complexity of Data Reviewed ?Labs: ordered. ?Radiology: ordered. ? ?Risk ?Prescription drug management. ?Decision regarding hospitalization. ? ? ?CT returns with worsening rectal mass, extensive perirectal and pelvic retroperitoneal adenopathy, additional findings as per report. ?Discussed results with patient, son and son's wife at bedside.  ?Son relays history of prior colonoscopy done in [place], [place] earlier this year. Admission in March with plan for colonoscopy, concerned that patient completed prep while having to lie in bed all night and then her scope was canceled and she was advised to follow-up in the office in 2 months.  Worsening pain, unable to be seen earlier in GI office prompted patient's son to take her to see general surgery earlier this week who advised to return to hospital for admission and work-up. ?[facility], Vicie Mutters, PA-C, consulted, will see the patient, Dr. Rush Landmark at [facility] today. ?Discussed with Dr. Olevia Bowens with [facility] hospitalist who will consult for admission. ? ? ? ? ?  ?Tacy Learn, PA-C ?10/09/21 4098 ? ?  ?Merrily Pew, MD ?10/10/21 0455 ? ?

## 2021-10-09 NOTE — Consult Note (Signed)
? ? ? ? ? Consultation ? ?Referring Provider:   Emergency room ?Primary Care Physician:  [name], MD ?Primary Gastroenterologist:  Junita Push, has seen Yolanda Franco in the hospital in March     ?Reason for Consultation:    Rectal mass, diarrhea ? ? Impression   ? ?88 year old female with history of atrial fibrillation on Eliquis, failure to thrive with malnutrition, and skilled nursing facility recovering from hip surgery presents with worsening rectal pain, diarrhea with CT showing rectal mass with likely mets to lymph nodes, liver pulmonary and possibly bone adrenal worsening significantly in a 43-monthtimeframe ? ?07[facility] [facility] ?08/21/2021 CT abdomen pelvis asymmetric thickening along wall of distal left rectum prominent lymph nodes with fat stranding concerning for inflammation/infection/neoplasm ?10/09/2021 CT AB and Pelvis Metastatic rectal cancer as evidenced by an enlarging rectal ?mass, extensive perirectal and pelvic retroperitoneal adenopathy, ?enlarging left adrenal mass and new/enlarging hepatic and pulmonary ?metastases. Enlarging sclerotic lesion in T11, also worrisome for ?metastatic disease. ?Son, KGrayland Ormond is at bedside, patient is able to answer questions appropriately ? ?Diarrhea and fecal incontinence/rectal pain ?Had recently been on Senokot 2 pills twice daily just stopped Monday ? ?Paroxysmal atrial fibrillation (HCC) ?On Eliquis 2.5 mg BID ?Has some SOB and new CP, no leg swelling ? ?Moderate protein malnutrition ([facility] ?Albumin 10/09/2021  2.5  ?BMI body mass index is unknown because there is no height or weight on file.  ?Secondary to  poor PO intake, cancer ? ?Mild dysphagia ?Patient does have some mild dysphagia and nausea. ?Some reflux ? ?Other hospital lists ?Normocytic anemia ?Hyponatremia ?Leukocytosis ?Aortic calcification (HCC) ? ? ? ? Plan  ? ?-After long discussion with the patient and her son, they are interested more comfort care measures than  pursuing treatment secondary to age and extent of possible carcinoma. ?-Discussed doing flexible sigmoidoscopy for tissue diagnosis, could do possibly with or without sedation, would just involve enemas and not a thorough preparation.  At this time they are unsure if they want to pursue any sort of procedures, will let the family discuss and will address at later time today. ?Would suggest hospice consultation ?-At this time I think pain control and comfort care for the patient is #1 priority ?-If patient continues to have diarrhea once off the stimulants can consider C. Difficile ?Most likely related to rectal mass ?- RD consult ?-Count calories, increase protein ?-More than willing to do fleck sigmoidoscopy for tissue sampling and diagnosis if patient and family would like to pursue this ?-Start PPI once daily ?- ? ?Thank you for your kind consultation, we will continue to follow. ?       ? HPI:   ?Yolanda Franco a 88y.o. female with past medical history significant for hypertension, hypothyroidism, depression, type 2 diabetes insulin-dependent, paroxysmal atrial fibrillation on Eliquis 2.5 mg twice daily, others listed below presents for rectal pain, diarrhea, poor p.o. intake. ? ?[facility] sessile nonbleeding polyps benign in appearance multiple polyps, large piece of stool in rectum and some places of stool in sigmoid limiting exam. ?[facility]08[facility]22/2021 presented to the the ER with rectal pain and worsening constipation.   ?CT abdomen pelvis during that visit showed asymmetric thickening along wall of distal left rectum prominent lymph nodes with fat stranding concerning for inflammation/infection/neoplasm ?At that time CT of the rectum thought to be possibly due to stool burden, did better with MiraLAX prep/purge, was  post to be set up for outpatient follow-up with [facility] for repeat  colonoscopy with 2-day prep however patient failed to follow-up at scheduled appointment. ?10/07/2021 patient saw Dr. Johney Maine due to diarrhea, feces incontinence and mass in rectum.  At that time digital and anoscopic rectal exam very concerning for left anterior fixed bulky ulcerated mass.  To help facilitate issues patient was advised to go to the ER ? ?Patient presents today to the ER with worsening rectal pain, nausea, failure to thrive, poor p.o. intake, diarrhea. ?Patient's had continuing rectal pain since last admission. ?Having bowel movements loose small-volume every 30 minutes to an hour. ?Dark brown/black stool.  Patient's not on iron supplement. ?Patient was on Senokot 2 tablets twice daily recently stopped Monday evening due to diarrhea.  Has had antibiotics over the last 3 months secondary to hip and UTI. ?Patient has lower abdominal discomfort worse with a bowel movement. ?Patient's had decreased p.o. intake has nausea worse with food, dry mouth and states she can have difficulty swallowing with some dysphagia in the esophagus.  Worse with pills.  No vomiting but has had some gagging.  Has been drinking boost at home ?Patient states she has had some newer chest discomfort and shortness of breath, no leg swelling. ?Currently she has been unable to walk secondary to back pain and has been more bedbound. ? ?CT abdomen pelvis with contrast unfortunately shows irregular rectal wall thickening with enlarging perirectal adenopathy and retroperitoneal adenopathy  likely metastatic rectal cancer enlarging left adrenal mass, new enlarging hepatic and pulmonary metastasis and enlarging sclerotic lesion T11. ?Labs show leukocytosis 15.2, hemoglobin 11 to appears to be above patient's baseline, possible hemoconcentration.  Platelets 312 ?Sodium 132, BUN 9, creatinine 0.37, albumin 2.5, alkaline phosphatase 155, AST 40, ALT 23.  Paced -22 ?Remarkable urinalysis ? ?Abnormal ED labs: ?Abnormal Labs Reviewed  ?CBC WITH  DIFFERENTIAL/PLATELET - Abnormal; Notable for the following components:  ?    Result Value  ? WBC 15.2 (*)   ? RBC 3.77 (*)   ? Hemoglobin 11.0 (*)   ? HCT 34.2 (*)   ? Neutro Abs 12.5 (*)   ? Monocytes Absolute 1.1 (*)   ? Abs Immature Granulocytes 0.22 (*)   ? All other components within normal limits  ?COMPREHENSIVE METABOLIC PANEL - Abnormal; Notable for the following components:  ? Sodium 132 (*)   ? Creatinine, Ser 0.37 (*)   ? Calcium 8.1 (*)   ? Total Protein 5.6 (*)   ? Albumin 2.5 (*)   ? Alkaline Phosphatase 155 (*)   ? All other components within normal limits  ?URINALYSIS, ROUTINE W REFLEX MICROSCOPIC - Abnormal; Notable for the following components:  ? APPearance HAZY (*)   ? All other components within normal limits  ? ? ? ?Past Medical History:  ?Diagnosis Date  ? Anxiety   ? Aortic calcification ([place]) 10/09/2021  ? Depression   ? Dizzy spells 10/27/2012  ? Essential hypertension 08/03/2021  ? Heart disease   ? leaking valve  ? Hypothyroidism 08/03/2021  ? Migraine   ? Shaking spells 10/27/2012  ? Weakness 10/27/2012  ? ? ?Surgical History:  ?She  has a past surgical history that includes Vaginal hysterectomy (1980); Bunionectomy (Bilateral, 1986); Knee surgery (Right, 1991); Nose surgery (mid 70's); Back surgery (1981, 2002); and Hip Arthroplasty (Right, 08/05/2021). ?Family History:  ?Her family history includes Arthritis in an other family member; Migraines in an other family member; Other in her father; Stroke in her mother. ?  Social History:  ? reports that she has never smoked. She has never used smokeless tobacco. She reports that she does not drink alcohol and does not use drugs. ? ?Prior to Admission medications   ?Medication Sig Start Date End Date Taking? Authorizing Provider  ?acetaminophen (TYLENOL) 500 MG tablet Take 1,000 mg by mouth 2 (two) times daily. May also take 1 additional tablet every 12 hours as needed for pain   Yes [provider]  ?amLODipine (NORVASC) 5 MG tablet Take 5  mg by mouth daily.   Yes [provider]  ?apixaban (ELIQUIS) 2.5 MG TABS tablet Take 2.5 mg by mouth 2 (two) times daily.   Yes [provider]  ?baclofen (LIORESAL) 10 MG tablet [place]

## 2021-10-09 NOTE — ED Provider Notes (Signed)
?[facility] ?Provider Note ? ? ?CSN: [id number] ?Arrival date & time: 10/09/21  0435 ? ?  ? ?History ? ?Chief Complaint  ?Patient presents with  ? Abdominal Pain  ? Rectal Pain  ? ? ?Yolanda Franco is a 88 y.o. female. ? ?HPI ?88 year old female with a history of migraines, anxiety, weakness, depression, frequent UTIs, questionable colonic mass with concerns for cancer seen on CT scan in March 2023 presents to the ER with worsening abdominal pain and rectal pain.  Per chart review, patient was admitted back on March 15 of this year with concerns for possible rectal mass and a close displaced fracture of the right femoral neck s/p ORIF.  She had a CT done which showed concern for rectal inflammation/infection/neoplasm.  She had a colonoscopy done prior to this which showed numerous polyps.  She was discharged with plans to follow-up with GI for a colonoscopy once her femur fracture has healed.  Abdominal pain was thought to be due to constipation and she was placed on senna, MiraLAX and Fleet enema.  She continues to have abdominal pain and worsening rectal pain.  She was seen by Dr. Johney Maine with general surgery on 5/1 who noted that she continues to have diarrhea despite stopping her laxatives and has had poor p.o. intake over the last several weeks, confirmed by son at bedside.  She denies any rectal bleeding.  He recommended that if her diarrhea does not improve that she be evaluated in the ER.  The patient has been in a rehab facility and has progressively been feeling weaker and her p.o. intake has continued to decrease.  She states that her tramadol is no longer controlling her rectal and abdominal pain.  She denies any nausea or vomiting. ? ?The patient son stated that they would preferably be like to be seen by another provider in the [place] practice ?  ? ?Home Medications ?Prior to Admission medications   ?Medication Sig Start Date End Date Taking? Authorizing Provider   ?acetaminophen (TYLENOL) 500 MG tablet Take 1,000 mg by mouth 2 (two) times daily. May also take 1 additional tablet every 12 hours as needed for pain   Yes [provider]  ?amLODipine (NORVASC) 5 MG tablet Take 5 mg by mouth daily.   Yes [provider]  ?apixaban (ELIQUIS) 2.5 MG TABS tablet Take 2.5 mg by mouth 2 (two) times daily.   Yes [provider]  ?baclofen (LIORESAL) 10 MG tablet Take 10 mg by mouth 2 (two) times daily.   Yes [provider]  ?feeding supplement (ENSURE ENLIVE / ENSURE PLUS) LIQD Take 237 mLs by mouth 2 (two) times daily between meals. 08/07/21  Yes Shahmehdi, Valeria Batman, MD  ?ferrous gluconate (FERGON) 324 MG tablet Take 324 mg by mouth every Monday, Wednesday, and Friday.   Yes [provider]  ?FLUoxetine (PROZAC) 40 MG capsule Take 40 mg by mouth daily.   Yes [provider]  ?HYDROCORTISONE ACE, RECTAL, 30 MG SUPP Place 30 mg rectally 2 (two) times daily.   Yes [provider]  ?insulin detemir (LEVEMIR) 100 UNIT/ML injection Inject 9 Units into the skin at bedtime.   Yes [provider]  ?lidocaine (LMX) 4 % cream Apply 1 application. topically 2 (two) times daily. Coat proctocort suppository thoroughly prior to placement.   Yes [provider]  ?loratadine (CLARITIN) 10 MG tablet Take 10 mg by mouth daily.   Yes [provider]  ?Multiple Vitamin (MULTIVITAMIN WITH  MINERALS) TABS tablet Take 1 tablet by mouth daily. 08/07/21  Yes Shahmehdi, Valeria Batman, MD  ?nystatin cream (MYCOSTATIN) Apply 1 application. topically 2 (two) times daily. Apply to periarea   Yes [provider]  ?traZODone (DESYREL) 150 MG tablet Take 150 mg by mouth at bedtime.   Yes [provider]  ?Azelastine HCl 137 MCG/SPRAY SOLN Place 1 spray into the nose 2 (two) times daily. ?Patient not taking: Reported on 10/09/2021    [provider]  ?fluticasone (FLONASE) 50 MCG/ACT nasal spray Place 1 spray into  both nostrils daily. ?Patient not taking: Reported on 10/09/2021    [provider]  ?Glucosamine 500 MG CAPS Take 500 mg by mouth every evening. ?Patient not taking: Reported on 10/09/2021 01/08/19   [provider]  ?guaiFENesin (MUCINEX) 600 MG 12 hr tablet Take 600 mg by mouth 2 (two) times daily. ?Patient not taking: Reported on 08/27/2021    [provider]  ?levothyroxine (SYNTHROID, LEVOTHROID) 25 MCG tablet Take 12.5 mcg by mouth daily before breakfast. ?Patient not taking: Reported on 10/09/2021 08/23/12   [provider]  ?mirtazapine (REMERON) 15 MG tablet Take 15 mg by mouth at bedtime. ?Patient not taking: Reported on 10/09/2021    [provider]  ?senna-docusate (SENOKOT-S) 8.6-50 MG tablet Take 1 tablet by mouth 2 (two) times daily between meals as needed for moderate constipation. ?Patient not taking: Reported on 10/09/2021 08/23/21   Mercy Riding, MD  ?   ? ?Allergies    ?Aspirin, Codeine, and Eggs or egg-derived products   ? ?Review of Systems   ?Review of Systems ?Ten systems reviewed and are negative for acute change, except as noted in the HPI.   ?Physical Exam ?Updated Vital Signs ?BP 124/71   Pulse 61   Temp 98.2 ?F (36.8 ?C) (Oral)   Resp (!) 26   SpO2 99%  ?Physical Exam ?Vitals and nursing note reviewed.  ?Constitutional:   ?   General: She is not in acute distress. ?   Appearance: She is well-developed.  ?HENT:  ?   Head: Normocephalic and atraumatic.  ?Eyes:  ?   Conjunctiva/sclera: Conjunctivae normal.  ?Cardiovascular:  ?   Rate and Rhythm: Normal rate and regular rhythm.  ?   Heart sounds: No murmur heard. ?Pulmonary:  ?   Effort: Pulmonary effort is normal. No respiratory distress.  ?   Breath sounds: Normal breath sounds.  ?Abdominal:  ?   Palpations: Abdomen is soft.  ?   Tenderness: There is no abdominal tenderness.  ?   Comments: Abdomen is soft, nontender  ?Genitourinary: ?   Comments: Rectal exam performed with RN at bedside, few external  hemorrhoids noted, with constant stool leakage.  Questionable left anterior mass, however patient tolerated exam poorly and thus exam was aborted fairly quickly.  No obvious bleeding. ?Musculoskeletal:     ?   General: No swelling.  ?   Cervical back: Neck supple.  ?Skin: ?   General: Skin is warm and dry.  ?   Capillary Refill: Capillary refill takes less than 2 seconds.  ?Neurological:  ?   Mental Status: She is alert.  ?Psychiatric:     ?   Mood and Affect: Mood normal.  ? ? ?ED Results / Procedures / Treatments   ?Labs ?(all labs ordered are listed, but only abnormal results are displayed) ?Labs Reviewed  ?CBC WITH DIFFERENTIAL/PLATELET  ?COMPREHENSIVE METABOLIC PANEL  ?LIPASE, BLOOD  ?URINALYSIS, ROUTINE W REFLEX MICROSCOPIC  ? ? ?  EKG ?None ? ?Radiology ?No results found. ? ?Procedures ?Procedures  ? ? ?Medications Ordered in ED ?Medications  ?fentaNYL (SUBLIMAZE) injection 25 mcg (has no administration in time range)  ? ? ?ED Course/ Medical Decision Making/ A&P ?  ? ?                        ?Medical Decision Making ?Amount and/or Complexity of Data Reviewed ?Labs: ordered. Decision-making details documented in ED Course. ? ?88 year old female presenting with abdominal pain and rectal pain in the setting of probable rectal cancer.  On arrival, she is uncomfortable appearing, weak.  Vitals are stable.  Abdomen is soft and nontender.  Rectal exam performed with evidence of external hemorrhoids, constant small stool leakage without evidence of blood, questionable left anterior mass however patient tolerated exam poorly and thus was aborted. ? ?Plan for labs to assess hydration/nutritional status.  We will need to speak with [facility], was seen by PA but they state that they would prefer to be seen by another provider.  Suspect she will need admission for pain control and further palliation ? ?Care of the patient signed out to [facility] who will oversee the rest of the patient's workup and determine disposition  accordingly   ? ?Final Clinical Impression(s) / ED Diagnoses ?Final diagnoses:  ?None  ? ? ?Rx / DC Orders ?ED Discharge Orders   ? ? None  ? ?  ? ? ?  ?Garald Balding, PA-C ?10/09/21 3295 ? ?  ?Mesner, Corene Cornea, [place]

## 2021-10-09 NOTE — H&P (Addendum)
?History and Physical  ? ? ?Patient: Yolanda Franco WJX:[id number] DOB: 1932-09-15 ?DOA: 10/09/2021 ?DOS: the patient was seen and examined on 10/09/2021 ?PCP: [name], MD  ?Patient coming from: SNF ? ?Chief Complaint:  ?Chief Complaint  ?Patient presents with  ? Abdominal Pain  ? Rectal Pain  ? ?HPI: Yolanda Franco is a 88 y.o. female with medical history significant of anxiety, depression, aortic atherosclerosis, dizzy spells, hypertension, unspecified heart valve disease, hypothyroidism not on levothyroxine, migraine headaches, generalized weakness who was recently admitted and discharged from 08/03/2021 until 08/07/2021 due to right hip fracture, readmitted from 08/21/2021 until 08/23/2021 due to abdominal pain with abnormal CT scan abdomen showing asymmetric thickening of the distal left rectum, treated for UTI and discharged home for follow-up with GI as an outpatient.  She is returning today due to abdominal and rectal pain.  No nausea at this time.  She has lost at least 10 pounds of weight recently.  She has some diarrhea with occasional constipation.  No melena or hematochezia.  No flank pain, significant post discharge dysuria or hematuria.  She gets occasional frequency due to interstitial cystitis.  No fever, chills, sore throat, chest pain, productive cough, dyspnea, palpitations, diaphoresis or lower extremity pitting edema.  No polyuria, polydipsia, polyphagia or blurred vision. ? ?ED course: Initial vital signs were temperature 98.2 ?F, pulse 93, respiration 30, BP 140/68 mmHg O2 sat 95% on room air.  The patient received fentanyl 25 mcg and sodium chloride 500 mg IVPB. ? ?Lab work: Her urinalysis had a hazy appearance but was otherwise unremarkable.  Lipase was normal.  CBC with a white count of 15.2, hemoglobin stable 11.0 g/dL platelets 312.  CMP shows a sodium of 132 mmol/L, the rest of the electrolytes and renal function are unremarkable when calcium is corrected.  Total protein 5.6 and  albumin 2.5 g/dL.  Alkaline phosphatase 155 units/L.  Transaminases and total bilirubin were normal. ? ?Imaging: CT abdomen/pelvis with contrast now shows metastatic rectal cancer as evidenced by enlarging rectal mass with extensive perirectal and pelvic retroperitoneal adenopathy, enlarging left adrenal mass and new enlarging hepatic/pulmonary metastasis.  There is an enlarging sclerotic lesions on T11 which is also worrisome for metastatic disease.  There was also aortic atherosclerosis.  Please see images and full radiology report for further details. ? ?Review of Systems: As mentioned in the history of present illness. All other systems reviewed and are negative. ?Past Medical History:  ?Diagnosis Date  ? Anxiety   ? Aortic calcification ([place]) 10/09/2021  ? Depression   ? Dizzy spells 10/27/2012  ? Essential hypertension 08/03/2021  ? Heart disease   ? leaking valve  ? Hypothyroidism 08/03/2021  ? Migraine   ? Shaking spells 10/27/2012  ? Weakness 10/27/2012  ? ?Past Surgical History:  ?Procedure Laterality Date  ? [place], 2002  ? BUNIONECTOMY Bilateral 1986  ? HIP ARTHROPLASTY Right 08/05/2021  ? Procedure: ARTHROPLASTY BIPOLAR HIP (HEMIARTHROPLASTY);  Surgeon: Mordecai Rasmussen, MD;  Location: [facility];  Service: Orthopedics;  Laterality: Right;  ? KNEE SURGERY Right 1991  ? Cartilage  ? NOSE SURGERY  mid 70's  ? VAGINAL HYSTERECTOMY  1980  ? ?Social History:  reports that she has never smoked. She has never used smokeless tobacco. She reports that she does not drink alcohol and does not use drugs. ? ?Allergies  ?Allergen Reactions  ? Aspirin Other (See Comments)  ?  migraine  ? Codeine   ?  Causes Migraines  ?  Eggs Or Egg-Derived Products   ?  Causes Migraine ? ?Poultry per [facility]  ? ? ?Family History  ?Problem Relation Age of Onset  ? Stroke Mother   ? Other Father   ?     surgery complications  ? Migraines Other   ? Arthritis Other   ? ? ?Prior to Admission medications   ?Medication Sig Start Date End Date  Taking? Authorizing Provider  ?acetaminophen (TYLENOL) 500 MG tablet Take 1,000 mg by mouth 2 (two) times daily. May also take 1 additional tablet every 12 hours as needed for pain   Yes [provider]  ?amLODipine (NORVASC) 5 MG tablet Take 5 mg by mouth daily.   Yes [provider]  ?apixaban (ELIQUIS) 2.5 MG TABS tablet Take 2.5 mg by mouth 2 (two) times daily.   Yes [provider]  ?baclofen (LIORESAL) 10 MG tablet Take 10 mg by mouth 2 (two) times daily.   Yes [provider]  ?feeding supplement (ENSURE ENLIVE / ENSURE PLUS) LIQD Take 237 mLs by mouth 2 (two) times daily between meals. 08/07/21  Yes Shahmehdi, Valeria Batman, MD  ?ferrous gluconate (FERGON) 324 MG tablet Take 324 mg by mouth every Monday, Wednesday, and Friday.   Yes [provider]  ?FLUoxetine (PROZAC) 40 MG capsule Take 40 mg by mouth daily.   Yes [provider]  ?HYDROCORTISONE ACE, RECTAL, 30 MG SUPP Place 30 mg rectally 2 (two) times daily.   Yes [provider]  ?insulin detemir (LEVEMIR) 100 UNIT/ML injection Inject 9 Units into the skin at bedtime.   Yes [provider]  ?lidocaine (LMX) 4 % cream Apply 1 application. topically 2 (two) times daily. Coat proctocort suppository thoroughly prior to placement.   Yes [provider]  ?loratadine (CLARITIN) 10 MG tablet Take 10 mg by mouth daily.   Yes [provider]  ?Multiple Vitamin (MULTIVITAMIN WITH MINERALS) TABS tablet Take 1 tablet by mouth daily. 08/07/21  Yes Shahmehdi, Valeria Batman, MD  ?nystatin cream (MYCOSTATIN) Apply 1 application. topically 2 (two) times daily. Apply to periarea   Yes [provider]  ?traZODone (DESYREL) 150 MG tablet Take 150 mg by mouth at bedtime.   Yes [provider]  ?Azelastine HCl 137 MCG/SPRAY SOLN Place 1 spray into the nose 2 (two) times daily. ?Patient not taking: Reported on 10/09/2021    [provider]  ?fluticasone (FLONASE) 50 MCG/ACT  nasal spray Place 1 spray into both nostrils daily. ?Patient not taking: Reported on 10/09/2021    [provider]  ?Glucosamine 500 MG CAPS Take 500 mg by mouth every evening. ?Patient not taking: Reported on 10/09/2021 01/08/19   [provider]  ?guaiFENesin (MUCINEX) 600 MG 12 hr tablet Take 600 mg by mouth 2 (two) times daily. ?Patient not taking: Reported on 08/27/2021    [provider]  ?levothyroxine (SYNTHROID, LEVOTHROID) 25 MCG tablet Take 12.5 mcg by mouth daily before breakfast. ?Patient not taking: Reported on 10/09/2021 08/23/12   [provider]  ?mirtazapine (REMERON) 15 MG tablet Take 15 mg by mouth at bedtime. ?Patient not taking: Reported on 10/09/2021    [provider]  ?senna-docusate (SENOKOT-S) 8.6-50 MG tablet Take 1 tablet by mouth 2 (two) times daily between meals as needed for moderate constipation. ?Patient not taking: Reported on 10/09/2021 08/23/21   Mercy Riding, MD  ? ? ?Physical Exam: ?Vitals:  ? 10/09/21 0830 10/09/21 0900 10/09/21 0930 10/09/21 0951  ?BP: 134/62 (!) 148/64 136/72   ?  Pulse: 82 (!) 31 88   ?Resp: (!) 27 (!) 27 (!) 33   ?Temp:    98 ?F (36.7 ?C)  ?TempSrc:    Oral  ?SpO2: 97% 97% 98%   ? ?Physical Exam ?Vitals and nursing note reviewed.  ?Constitutional:   ?   Appearance: She is normal weight.  ?HENT:  ?   Head: Normocephalic.  ?   Mouth/Throat:  ?   Mouth: Mucous membranes are moist.  ?Eyes:  ?   General: No scleral icterus. ?   Pupils: Pupils are equal, round, and reactive to light.  ?Neck:  ?   Vascular: No JVD.  ?Cardiovascular:  ?   Rate and Rhythm: Normal rate. Rhythm irregularly irregular.  ?   Heart sounds: S1 normal and S2 normal.  ?Pulmonary:  ?   Effort: No respiratory distress.  ?   Breath sounds: Normal breath sounds. No wheezing or rales.  ?Abdominal:  ?   General: Bowel sounds are normal.  ?   Palpations: Abdomen is soft.  ?   Tenderness: There is abdominal tenderness in the left lower quadrant. There is no right CVA  tenderness, left CVA tenderness, guarding or rebound.  ?Musculoskeletal:  ?   Cervical back: Neck supple.  ?   Right lower leg: No edema.  ?   Left lower leg: No edema.  ?Skin: ?   General: Skin is warm and dry.  ?

## 2021-10-09 NOTE — Progress Notes (Signed)
Went back this afternoon to talk with family and patient about potentially proceeding with flex sigmoidoscopy. ?Patient states she is starting to have some pain control, Dilaudid helps more than oxycodone.  She is most concerned about being comfortable. ?At this time the patient's not interested in pursuing a fleck sigmoidoscopy, she does want to try regular food. ?Discontinued liquid diet, changed to soft diet. ?Patient has had some nausea has Zofran as needed, will add on pantoprazole once daily. ?Family requesting referral for [facility] [place], transition of care consult has been placed. ?

## 2021-10-09 NOTE — Progress Notes (Signed)
Patient family requested a referral for [facility]. MD and TOC notified.  ?

## 2021-10-09 NOTE — Progress Notes (Signed)
Patient admitted from ED in NAD. VSS. In 5/10 abdominal pain. Patient and family oriented to unit and call bell. 2 RN skin assessment completed with Musician. See flowsheets for details. ?

## 2021-10-10 DIAGNOSIS — Z66 Do not resuscitate: Secondary | ICD-10-CM | POA: Diagnosis not present

## 2021-10-10 DIAGNOSIS — K6289 Other specified diseases of anus and rectum: Secondary | ICD-10-CM | POA: Diagnosis not present

## 2021-10-10 DIAGNOSIS — Z515 Encounter for palliative care: Secondary | ICD-10-CM

## 2021-10-10 DIAGNOSIS — Z7189 Other specified counseling: Secondary | ICD-10-CM | POA: Diagnosis not present

## 2021-10-10 DIAGNOSIS — R152 Fecal urgency: Secondary | ICD-10-CM

## 2021-10-10 DIAGNOSIS — R159 Full incontinence of feces: Secondary | ICD-10-CM

## 2021-10-10 DIAGNOSIS — L8990 Pressure ulcer of unspecified site, unspecified stage: Secondary | ICD-10-CM | POA: Insufficient documentation

## 2021-10-10 LAB — COMPREHENSIVE METABOLIC PANEL
ALT: 19 U/L (ref 0–44)
AST: 38 U/L (ref 15–41)
Albumin: 2.6 g/dL — ABNORMAL LOW (ref 3.5–5.0)
Alkaline Phosphatase: 212 U/L — ABNORMAL HIGH (ref 38–126)
Anion gap: 10 (ref 5–15)
BUN: 9 mg/dL (ref 8–23)
CO2: 24 mmol/L (ref 22–32)
Calcium: 8.4 mg/dL — ABNORMAL LOW (ref 8.9–10.3)
Chloride: 100 mmol/L (ref 98–111)
Creatinine, Ser: 0.42 mg/dL — ABNORMAL LOW (ref 0.44–1.00)
GFR, Estimated: >60 mL/min (ref >60)
Glucose, Bld: 118 mg/dL — ABNORMAL HIGH (ref 70–99)
Potassium: 4.6 mmol/L (ref 3.5–5.1)
Sodium: 134 mmol/L — ABNORMAL LOW (ref 135–145)
Total Bilirubin: 0.6 mg/dL (ref 0.3–1.2)
Total Protein: 5.8 g/dL — ABNORMAL LOW (ref 6.5–8.1)

## 2021-10-10 LAB — CBC
HCT: 34.2 % — ABNORMAL LOW (ref 36.0–46.0)
Hemoglobin: 11.1 g/dL — ABNORMAL LOW (ref 12.0–15.0)
MCH: 29.4 pg (ref 26.0–34.0)
MCHC: 32.5 g/dL (ref 30.0–36.0)
MCV: 90.7 fL (ref 80.0–100.0)
Platelets: 347 10*3/uL (ref 150–400)
RBC: 3.77 MIL/uL — ABNORMAL LOW (ref 3.87–5.11)
RDW: 14.8 % (ref 11.5–15.5)
WBC: 14.2 10*3/uL — ABNORMAL HIGH (ref 4.0–10.5)
nRBC: 0 % (ref 0.0–0.2)

## 2021-10-10 LAB — GLUCOSE, CAPILLARY
Glucose-Capillary: 120 mg/dL — ABNORMAL HIGH (ref 70–99)
Glucose-Capillary: 127 mg/dL — ABNORMAL HIGH (ref 70–99)
Glucose-Capillary: 141 mg/dL — ABNORMAL HIGH (ref 70–99)
Glucose-Capillary: 126 mg/dL — ABNORMAL HIGH (ref 70–99)

## 2021-10-10 LAB — MRSA NEXT GEN BY PCR, NASAL: MRSA by PCR Next Gen: NOT DETECTED

## 2021-10-10 MED ORDER — BOOST / RESOURCE BREEZE PO LIQD CUSTOM
1.0000 | Freq: Three times a day (TID) | ORAL | Status: DC
  Administered 2021-10-10 – 2021-10-13 (×8): 1 via ORAL

## 2021-10-10 MED ORDER — PSYLLIUM 95 % PO PACK
1.0000 | PACK | Freq: Every day | ORAL | Status: DC
  Administered 2021-10-10 – 2021-10-13 (×4): 1 via ORAL
  Filled 2021-10-10 (×4): qty 1

## 2021-10-10 MED ORDER — HYDROMORPHONE HCL 2 MG PO TABS
2.0000 mg | ORAL_TABLET | ORAL | Status: DC | PRN
  Administered 2021-10-10 – 2021-10-11 (×4): 2 mg via ORAL
  Filled 2021-10-10 (×4): qty 1

## 2021-10-10 NOTE — Progress Notes (Signed)
?PROGRESS NOTE ? ? ? ?VERENISE Franco  INO:[id number] DOB: 19-Jun-1932 DOA: 10/09/2021 ?PCP: [name], MD  ? ?Brief Narrative: ?88 year old with past medical history significant for anxiety, depression, dizzy spell, hypertension, hypothyroidism, not on levothyroxine, migraine, recently admitted for right hip fracture on 08/03/2021.  Subsequently readmitted due to abdominal pain and found to have asymmetric thickening of the distal rectum, she was treated for UTI and discharged to have follow-up with GI as an outpatient.  She presented with worsening rectal pain, weight loss. ? ?CT scan show rectal mass with now evidence of likely metastatic disease.  GI was consulted for further evaluation.  Patient and family has decided to proceed with further evaluation and focus on comfort care.  Appreciate palliative care assistance with pain management ? ?Assessment & Plan: ?  ?Principal Problem: ?  Rectal mass ?Active Problems: ?  Paroxysmal atrial fibrillation ([place]) ?  Insulin-requiring or dependent type II diabetes mellitus ([place]) ?  Anxiety and depression ?  Hypothyroidism ?  Essential hypertension ?  Moderate protein malnutrition ([place]) ?  Normocytic anemia ?  Hyponatremia ?  Leukocytosis ?  Aortic calcification (HCC) ?  Pressure injury of skin ? ?1-Rectal mass with no evidence of metastatic disease ?-Patient wants to focus and quality and colon for ?-Plan to discharge home with hospice care ?-Continue with pain management, started on oral Dilaudid as needed and IV Dilaudid as needed ? ?2-Paroxysmal A-fib: Continue to hold anticoagulation, plan to proceed with comfort care.  She is also at risk for bleeding due to rectal mass ? ?3-Anxiety, depression: Continue with trazodone and fluoxetine ?4-diabetes type 2: Continue with insulin and sliding scale ?5-Hypertension: Continue with amlodipine ?6-moderate protein caloric malnutrition: Continue with Ensure ?7-leukocytosis could be reactive ? ?Pressure Injury 10/09/21 Sacrum  Right;Left;Mid Stage 1 -  Intact skin with non-blanchable redness of a localized area usually over a bony prominence. (Active)  ?10/09/21 1130  ?Location: Sacrum  ?Location Orientation: Right;Left;Mid  ?Staging: Stage 1 -  Intact skin with non-blanchable redness of a localized area usually over a bony prominence.  ?Wound Description (Comments):   ?Present on Admission: Yes  ?Dressing Type Foam - Lift dressing to assess site every shift 10/09/21 1950  ? ? ? ? ?Estimated body mass index is 19.52 kg/m? as calculated from the following: ?  Height as of this encounter: '5\' 5"'$  (1.651 m). ?  Weight as of this encounter: 53.2 kg. ? ? ?DVT prophylaxis: SCD ?Code Status: DNR ?Family Communication: Daughter in Sports coach at bedside ?Disposition Plan:  ?Status is: Observation ?The patient remains OBS appropriate and will d/c before 2 midnights. ? ? ? ?Consultants:  ?GI ?Palliative ? ?Procedures:  ?None ? ?Antimicrobials:  ? ? ?Subjective: ?She is complaining of rectal pain.  ?Received IV dilaudid this morning. Nurse will get more pain meds.  ? ?Objective: ?Vitals:  ? 10/10/21 0305 10/10/21 1042 10/10/21 1136 10/10/21 1236  ?BP: (!) 178/79 (!) 146/70 137/64 (!) 135/57  ?Pulse: (!) 56 75 78 88  ?Resp: (!) 24  18 (!) 22  ?Temp: 98.2 ?F (36.8 ?C)  98.1 ?F (36.7 ?C) 97.6 ?F (36.4 ?C)  ?TempSrc: Oral  Oral Oral  ?SpO2: 96%  96% 96%  ?Weight:      ?Height:      ? ? ?Intake/Output Summary (Last 24 hours) at 10/10/2021 1840 ?Last data filed at 10/10/2021 1829 ?Gross per 24 hour  ?Intake 120 ml  ?Output 1200 ml  ?Net -1080 ml  ? ?Filed Weights  ? 10/09/21 1100  ?  Weight: 53.2 kg  ? ? ?Examination: ? ?General exam: Appears calm and comfortable  ?Respiratory system: Clear to auscultation. Respiratory effort normal. ?Cardiovascular system: S1 & S2 heard, RRR.  ?Gastrointestinal system: Abdomen is nondistended, soft and nontender.  ?Central nervous system: Alert and oriented.  ?Extremities: no edema ? ? ?Data Reviewed: I have personally reviewed  following labs and imaging studies ? ?CBC: ?Recent Labs  ?Lab 10/09/21 ?0620 10/10/21 ?0351  ?WBC 15.2* 14.2*  ?NEUTROABS 12.5*  --   ?HGB 11.0* 11.1*  ?HCT 34.2* 34.2*  ?MCV 90.7 90.7  ?PLT 312 347  ? ?Basic Metabolic Panel: ?Recent Labs  ?Lab 10/09/21 ?0620 10/10/21 ?0351  ?NA 132* 134*  ?K 4.4 4.6  ?CL 102 100  ?CO2 24 24  ?GLUCOSE 83 118*  ?BUN 9 9  ?CREATININE 0.37* 0.42*  ?CALCIUM 8.1* 8.4*  ? ?GFR: ?Estimated Creatinine Clearance: 42.4 mL/min (A) (by C-G formula based on SCr of 0.42 mg/dL (L)). ?Liver Function Tests: ?Recent Labs  ?Lab 10/09/21 ?0620 10/10/21 ?0351  ?AST 40 38  ?ALT 23 19  ?ALKPHOS 155* 212*  ?BILITOT 0.7 0.6  ?PROT 5.6* 5.8*  ?ALBUMIN 2.5* 2.6*  ? ?Recent Labs  ?Lab 10/09/21 ?0620  ?LIPASE 22  ? ?No results for input(s): AMMONIA in the last 168 hours. ?Coagulation Profile: ?No results for input(s): INR, PROTIME in the last 168 hours. ?Cardiac Enzymes: ?No results for input(s): CKTOTAL, CKMB, CKMBINDEX, TROPONINI in the last 168 hours. ?BNP (last 3 results) ?No results for input(s): PROBNP in the last 8760 hours. ?HbA1C: ?No results for input(s): HGBA1C in the last 72 hours. ?CBG: ?Recent Labs  ?Lab 10/09/21 ?1658 10/09/21 ?2052 10/10/21 ?0730 10/10/21 ?1133 10/10/21 ?1622  ?GLUCAP 123* 111* 120* 127* 141*  ? ?Lipid Profile: ?No results for input(s): CHOL, HDL, LDLCALC, TRIG, CHOLHDL, LDLDIRECT in the last 72 hours. ?Thyroid Function Tests: ?No results for input(s): TSH, T4TOTAL, FREET4, T3FREE, THYROIDAB in the last 72 hours. ?Anemia Panel: ?No results for input(s): VITAMINB12, FOLATE, FERRITIN, TIBC, IRON, RETICCTPCT in the last 72 hours. ?Sepsis Labs: ?No results for input(s): PROCALCITON, LATICACIDVEN in the last 168 hours. ? ?Recent Results (from the past 240 hour(s))  ?MRSA Next Gen by PCR, Nasal     Status: None  ? Collection Time: 10/09/21  7:34 PM  ? Specimen: Nasal Mucosa; Nasal Swab  ?Result Value Ref Range Status  ? MRSA by PCR Next Gen NOT DETECTED NOT DETECTED Final  ?  Comment:  (NOTE) ?The GeneXpert MRSA Assay (FDA approved for NASAL specimens only), ?is one component of a comprehensive MRSA colonization surveillance ?program. It is not intended to diagnose MRSA infection nor to guide ?or monitor treatment for MRSA infections. ?Test performance is not FDA approved in patients less than 2 years ?old. ?Performed at [facility], [address] ?[place], [place] [address] ?  ?  ? ? ? ? ? ?Radiology Studies: ?CT Abdomen Pelvis W Contrast ? ?Result Date: 10/09/2021 ?CLINICAL DATA:  Increasing abdominal and rectal pain. Possible rectal mass on previous CT abdomen pelvis. Mild leukocytosis. * Tracking Code: BO * EXAM: CT ABDOMEN AND PELVIS WITH CONTRAST TECHNIQUE: Multidetector CT imaging of the abdomen and pelvis was performed using the standard protocol following bolus administration of intravenous contrast. RADIATION DOSE REDUCTION: This exam was performed according to the departmental dose-optimization program which includes automated exposure control, adjustment of the mA and/or kV according to patient size and/or use of iterative reconstruction technique. CONTRAST:  115m OMNIPAQUE IOHEXOL 300 MG/ML  SOLN  COMPARISON:  08/21/2021. FINDINGS: Lower chest: New or enlarging basilar pulmonary nodules measure up to 5 mm in anterior lingula (5/9). Dependent atelectasis bilaterally, left greater than right. No pleural fluid. Atherosclerotic calcification of the aorta. Heart is enlarged. No pericardial effusion. Hepatobiliary: Marked increase in number of mildly hypodense lesions throughout the liver with an index nodule in the dome measuring 1.7 cm (2/9). Gallbladder is unremarkable. No biliary ductal dilatation. Pancreas: Negative. Spleen: Negative. Adrenals/Urinary Tract: Right adrenal gland is unremarkable. Enlarging left adrenal nodule measures 2.0 x 2.2 cm (2/27), previously 1.4 x 1.4 cm. Subcentimeter low-attenuation lesions in the kidneys are too small to characterize. No  follow-up necessary. Kidneys are otherwise unremarkable. Ureters are decompressed. Bladder is partially obscured by streak artifact from a right hip arthroplasty. Stomach/Bowel: Stomach, small bowel, appendix an

## 2021-10-10 NOTE — Consult Note (Signed)
?Consultation Note ?Date: 10/10/2021  ? ?Patient Name: Yolanda Franco  ?DOB: [birth year]  MRN: [id number]  Age / Sex: 88 y.o., female  ?PCP: [name], MD ?Referring Physician: Elmarie Shiley, MD ? ?Reason for Consultation: Establishing goals of care ? ?HPI/Patient Profile: 88 y.o. female  with past medical history of  anxiety, depression, aortic atherosclerosis, dizzy spells, hypertension, unspecified heart valve disease, hypothyroidism not on levothyroxine, migraine headaches, and generalized weakness admitted on 10/09/2021 with abdominal pain and rectal pain. Recently admitted and discharged from 08/03/2021 until 08/07/2021 due to right hip fracture, readmitted from 08/21/2021 until 08/23/2021 due to abdominal pain with abnormal CT scan abdomen showing asymmetric thickening of the distal left rectum, treated for UTI and discharged home for follow-up with GI as an outpatient.  This admission patient CT of abdomen and pelvis reveals likely metastatic rectal cancer as evidenced by enlarging rectal mass with extensive perirectal and pelvic retroperitoneal adenopathy, enlarging left adrenal mass, and new enlarging hepatic and pulmonary mets.  There was also an enlarging sclerotic lesion on T11.  Patient met with GI and made decision to focus on comfort and avoid aggressive medical interventions.  Patient has had difficulty with symptom management.  PMT consulted to discuss goals of care. ? ?Clinical Assessment and Goals of Care: ?I have reviewed medical records including EPIC notes, labs and imaging, received report from nurse, assessed the patient and then met with patient to discuss diagnosis prognosis, GOC, EOL wishes, disposition and options. ? ?I introduced Palliative Medicine as specialized medical care for people living with serious illness. It focuses on providing relief from the symptoms and stress of a serious illness. The goal is to improve quality of life for  both the patient and the family. ? ? We discussed patient's current illness and what it means in the larger context of patient's on-going co-morbidities.  Natural disease trajectory and expectations at EOL were discussed.  Patient is aware of her metastatic cancer and confirms her desire to focus on comfort and avoid aggressive medical interventions. ? ?We reviewed her symptoms-patient tells me pain is well controlled with IV Dilaudid.  She tells me she does not tolerate pills. We discussed trialing some liquid dilaudid for her to take when she discharges from hospital. ? ?Patient tells me her most distressing symptom is her bowel incontinence - she tells me it is very small amounts but comes on rapidly before she is able to get to bed pan and therefore has incontinent episode. Discussed with patient, GI PA Estill Bamberg, and patient's DIL trialing psyllium to bulk up stools in an attempt to lessen incontinent episodes - all agree to trial.  ? ?Encouraged patient/family to consider DNR/DNI status understanding evidenced based poor outcomes in similar hospitalized patients, as the cause of the arrest is likely associated with chronic/terminal disease rather than a reversible acute cardio-pulmonary event. Patient and family agree to change to DNR.  ? ?Hospice services outpatient were explained and offered. Patient is interested in hospice care at her ALF - family agrees. Discussed with hospice liaison - patient and family request [facility].  ? ?Questions and concerns were addressed. The family was encouraged to call with questions or concerns. ? ?Primary Decision Maker ?PATIENT ?  ? ?SUMMARY OF RECOMMENDATIONS   ?Conitnue prn IV Dilaudid, trial liquid p.o. Dilaudid ?Trial psyllium to bulk up stools and lessen incontinent episodes as these are very distressing to patient ?CODE STATUS changed to DNR ?Patient would like to DC back to ALF with hospice  involvement ? ?Code Status/Advance Care Planning: ?DNR ? ?   ? ?Primary Diagnoses: ?Present on Admission: ? Rectal mass ? Paroxysmal atrial fibrillation (HCC) ? Anxiety and depression ? Hypothyroidism ? Essential hypertension ? Moderate protein malnutrition ([place]) ? Normocytic anemia ? Hyponatremia ? Leukocytosis ? Aortic calcification (HCC) ? ? ?I have reviewed the medical record, interviewed the patient and family, and examined the patient. The following aspects are pertinent. ? ?Past Medical History:  ?Diagnosis Date  ? Anxiety   ? Aortic calcification ([place]) 10/09/2021  ? Depression   ? Dizzy spells 10/27/2012  ? Essential hypertension 08/03/2021  ? Heart disease   ? leaking valve  ? Hypothyroidism 08/03/2021  ? Migraine   ? Shaking spells 10/27/2012  ? Weakness 10/27/2012  ? ?Social History  ? ?Socioeconomic History  ? Marital status: Married  ?  Spouse name: Jeneen Rinks  ? Number of children: 3  ? Years of education: 11th  ? Highest education level: Not on file  ?Occupational History  ? Occupation: Retired  ?Tobacco Use  ? Smoking status: Never  ? Smokeless tobacco: Never  ?Substance and Sexual Activity  ? Alcohol use: No  ? Drug use: No  ? Sexual activity: Not on file  ?Other Topics Concern  ? Not on file  ?Social History Narrative  ? Pt lives at home with niece, Anne Ng.Caffeine Use: Quit in 1994  ? ?Social Determinants of Health  ? ?Financial Resource Strain: Not on file  ?Food Insecurity: Not on file  ?Transportation Needs: Not on file  ?Physical Activity: Not on file  ?Stress: Not on file  ?Social Connections: Not on file  ? ?Family History  ?Problem Relation Age of Onset  ? Stroke Mother   ? Other Father   ?     surgery complications  ? Migraines Other   ? Arthritis Other   ? ?Scheduled Meds: ? amLODipine  5 mg Oral Daily  ? feeding supplement  1 Container Oral TID BM  ? feeding supplement  237 mL Oral BID BM  ? ferrous gluconate  324 mg Oral Q M,W,F  ? FLUoxetine  40 mg Oral Daily  ? hydrocortisone  25 mg Rectal BID  ? insulin detemir  9 Units Subcutaneous QHS  ? nystatin  cream  1 application. Topical BID  ? pantoprazole  40 mg Oral Daily  ? psyllium  1 packet Oral Daily  ? traZODone  150 mg Oral QHS  ? ?Continuous Infusions: ?PRN Meds:.acetaminophen **OR** acetaminophen, baclofen, HYDROmorphone (DILAUDID) injection, HYDROmorphone, ondansetron **OR** ondansetron (ZOFRAN) IV ?Allergies  ?Allergen Reactions  ? Aspirin Other (See Comments)  ?  migraine  ? Codeine   ?  Causes Migraines  ? Eggs Or Egg-Derived Products   ?  Causes Migraine ? ?Poultry per [facility]  ? ?Review of Systems  ?Constitutional:  Positive for activity change, appetite change and fatigue.  ?Gastrointestinal:  Positive for abdominal pain.  ?     Incontinent episodes  ? ?Physical Exam ?Constitutional:   ?   General: She is not in acute distress. ?   Appearance: She is ill-appearing.  ?Pulmonary:  ?   Effort: Pulmonary effort is normal.  ?Skin: ?   General: Skin is warm and dry.  ?Neurological:  ?   Mental Status: She is alert and oriented to person, place, and time.  ? ? ?Vital Signs: BP (!) 135/57 (BP Location: Left Arm)   Pulse 88   Temp 97.6 ?F (36.4 ?C) (Oral)   Resp (!) 22  Ht 5' 5"  (1.651 m)   Wt 53.2 kg   SpO2 96%   BMI 19.52 kg/m?  ?Pain Scale: 0-10 ?  ?Pain Score: 7  ? ? ?SpO2: SpO2: 96 % ?O2 Device:SpO2: 96 % ?O2 Flow Rate: .  ? ?IO: Intake/output summary:  ?Intake/Output Summary (Last 24 hours) at 10/10/2021 1423 ?Last data filed at 10/10/2021 0300 ?Gross per 24 hour  ?Intake 120 ml  ?Output 550 ml  ?Net -430 ml  ? ? ?LBM: Last BM Date : 10/09/21 ?Baseline Weight: Weight: 53.2 kg ?Most recent weight: Weight: 53.2 kg     ?Palliative Assessment/Data: PPS 50% ? ? ? ? ?*Please note that this is a verbal dictation therefore any spelling or grammatical errors are due to the "[place] [facility]" system interpretation. ? ? ?Juel Burrow, DNP, AGNP-C ?Palliative Medicine Team ?[phone] ?Pager: [phone] ? ?

## 2021-10-10 NOTE — Progress Notes (Signed)
The patient is complaining of abdominal pain and nausea this am. Will administered analgesic and antiemetic to assist with symptoms. Will hold am medications until symptoms have resolved.  ?

## 2021-10-10 NOTE — Progress Notes (Signed)
Pt transferred to Rm 1607 on 6th floor. Pt and family of pt updated on room change.  ?Family member, Amy, expressed concern about pt being discharged back to ALF with hospice care and would like to coordinate with medical staff so that pt would have pain medicine ready to use once at ALF. Rn explained plan of care and reassured family of goals for pt care.  ? ? ?

## 2021-10-11 DIAGNOSIS — Z515 Encounter for palliative care: Secondary | ICD-10-CM | POA: Diagnosis not present

## 2021-10-11 DIAGNOSIS — R109 Unspecified abdominal pain: Secondary | ICD-10-CM | POA: Diagnosis present

## 2021-10-11 DIAGNOSIS — N3010 Interstitial cystitis (chronic) without hematuria: Secondary | ICD-10-CM | POA: Diagnosis present

## 2021-10-11 DIAGNOSIS — Z96641 Presence of right artificial hip joint: Secondary | ICD-10-CM | POA: Diagnosis present

## 2021-10-11 DIAGNOSIS — Z66 Do not resuscitate: Secondary | ICD-10-CM | POA: Diagnosis not present

## 2021-10-11 DIAGNOSIS — I700 Atherosclerosis of aorta: Secondary | ICD-10-CM | POA: Diagnosis present

## 2021-10-11 DIAGNOSIS — C7972 Secondary malignant neoplasm of left adrenal gland: Secondary | ICD-10-CM | POA: Diagnosis present

## 2021-10-11 DIAGNOSIS — G893 Neoplasm related pain (acute) (chronic): Secondary | ICD-10-CM | POA: Diagnosis not present

## 2021-10-11 DIAGNOSIS — I480 Paroxysmal atrial fibrillation: Secondary | ICD-10-CM | POA: Diagnosis present

## 2021-10-11 DIAGNOSIS — R159 Full incontinence of feces: Secondary | ICD-10-CM | POA: Diagnosis not present

## 2021-10-11 DIAGNOSIS — K5900 Constipation, unspecified: Secondary | ICD-10-CM | POA: Diagnosis present

## 2021-10-11 DIAGNOSIS — E440 Moderate protein-calorie malnutrition: Secondary | ICD-10-CM | POA: Diagnosis not present

## 2021-10-11 DIAGNOSIS — F419 Anxiety disorder, unspecified: Secondary | ICD-10-CM | POA: Diagnosis present

## 2021-10-11 DIAGNOSIS — C20 Malignant neoplasm of rectum: Secondary | ICD-10-CM | POA: Diagnosis present

## 2021-10-11 DIAGNOSIS — K6289 Other specified diseases of anus and rectum: Secondary | ICD-10-CM | POA: Diagnosis not present

## 2021-10-11 DIAGNOSIS — Z7189 Other specified counseling: Secondary | ICD-10-CM | POA: Diagnosis not present

## 2021-10-11 DIAGNOSIS — D649 Anemia, unspecified: Secondary | ICD-10-CM | POA: Diagnosis present

## 2021-10-11 DIAGNOSIS — C772 Secondary and unspecified malignant neoplasm of intra-abdominal lymph nodes: Secondary | ICD-10-CM | POA: Diagnosis present

## 2021-10-11 DIAGNOSIS — R197 Diarrhea, unspecified: Secondary | ICD-10-CM | POA: Diagnosis present

## 2021-10-11 DIAGNOSIS — Z90710 Acquired absence of both cervix and uterus: Secondary | ICD-10-CM | POA: Diagnosis not present

## 2021-10-11 DIAGNOSIS — F32A Depression, unspecified: Secondary | ICD-10-CM | POA: Diagnosis present

## 2021-10-11 DIAGNOSIS — Z681 Body mass index (BMI) 19 or less, adult: Secondary | ICD-10-CM | POA: Diagnosis not present

## 2021-10-11 DIAGNOSIS — E039 Hypothyroidism, unspecified: Secondary | ICD-10-CM | POA: Diagnosis present

## 2021-10-11 DIAGNOSIS — I119 Hypertensive heart disease without heart failure: Secondary | ICD-10-CM | POA: Diagnosis present

## 2021-10-11 DIAGNOSIS — C7800 Secondary malignant neoplasm of unspecified lung: Secondary | ICD-10-CM | POA: Diagnosis present

## 2021-10-11 DIAGNOSIS — E871 Hypo-osmolality and hyponatremia: Secondary | ICD-10-CM | POA: Diagnosis present

## 2021-10-11 DIAGNOSIS — E119 Type 2 diabetes mellitus without complications: Secondary | ICD-10-CM | POA: Diagnosis present

## 2021-10-11 DIAGNOSIS — E46 Unspecified protein-calorie malnutrition: Secondary | ICD-10-CM | POA: Diagnosis present

## 2021-10-11 LAB — GLUCOSE, CAPILLARY
Glucose-Capillary: 67 mg/dL — ABNORMAL LOW (ref 70–99)
Glucose-Capillary: 101 mg/dL — ABNORMAL HIGH (ref 70–99)
Glucose-Capillary: 202 mg/dL — ABNORMAL HIGH (ref 70–99)
Glucose-Capillary: 214 mg/dL — ABNORMAL HIGH (ref 70–99)
Glucose-Capillary: 133 mg/dL — ABNORMAL HIGH (ref 70–99)

## 2021-10-11 MED ORDER — LIP MEDEX EX OINT
TOPICAL_OINTMENT | CUTANEOUS | Status: DC | PRN
  Filled 2021-10-11: qty 7

## 2021-10-11 MED ORDER — HYDROMORPHONE HCL 4 MG PO TABS
4.0000 mg | ORAL_TABLET | ORAL | Status: DC | PRN
Start: 2021-10-11 — End: 2021-10-13
  Administered 2021-10-11 – 2021-10-12 (×6): 4 mg via ORAL
  Filled 2021-10-11 (×7): qty 1

## 2021-10-11 MED ORDER — ALUM & MAG HYDROXIDE-SIMETH 200-200-20 MG/5ML PO SUSP
30.0000 mL | ORAL | Status: DC | PRN
Start: 2021-10-11 — End: 2021-10-16
  Administered 2021-10-11: 30 mL via ORAL
  Filled 2021-10-11: qty 30

## 2021-10-11 NOTE — Progress Notes (Addendum)
?                                                   ?Daily Progress Note  ? ?Patient Name: Yolanda Franco       Date: 10/11/2021 ?DOB: [birth year]  Age: 88 y.o. MRN#: [id number] ?Attending Physician: Elmarie Shiley, MD ?Primary Care Physician: [name], MD ?Admit Date: 10/09/2021 ? ?Reason for Consultation/Follow-up: Establishing goals of care ? ?Subjective: ?Pain better controlled with current regimen ?Reports less fecal incontinent episodes overnight ? ?Length of Stay: 0 ? ?Current Medications: ?Scheduled Meds:  ? amLODipine  5 mg Oral Daily  ? feeding supplement  1 Container Oral TID BM  ? feeding supplement  237 mL Oral BID BM  ? ferrous gluconate  324 mg Oral Q M,W,F  ? FLUoxetine  40 mg Oral Daily  ? hydrocortisone  25 mg Rectal BID  ? insulin detemir  9 Units Subcutaneous QHS  ? nystatin cream  1 application. Topical BID  ? pantoprazole  40 mg Oral Daily  ? psyllium  1 packet Oral Daily  ? traZODone  150 mg Oral QHS  ? ? ?Continuous Infusions: ? ? ?PRN Meds: ?acetaminophen **OR** acetaminophen, baclofen, HYDROmorphone (DILAUDID) injection, HYDROmorphone, lip balm, ondansetron **OR** ondansetron (ZOFRAN) IV ? ?Physical Exam ?Constitutional:   ?   General: She is not in acute distress. ?   Appearance: She is ill-appearing.  ?Pulmonary:  ?   Effort: Pulmonary effort is normal.  ?Skin: ?   General: Skin is warm and dry.  ?Neurological:  ?   Mental Status: She is alert.  ?         ? ?Vital Signs: BP 139/66 (BP Location: Left Arm)   Pulse 91   Temp 97.6 ?F (36.4 ?C) (Oral)   Resp 16   Ht 5' 5"  (1.651 m)   Wt 53.2 kg   SpO2 96%   BMI 19.52 kg/m?  ?SpO2: SpO2: 96 % ?O2 Device: O2 Device: Room Air ?O2 Flow Rate:   ? ?Intake/output summary:  ?Intake/Output Summary (Last 24 hours) at 10/11/2021 1027 ?Last data filed at 10/10/2021 1900 ?Gross per 24 hour  ?Intake 120 ml   ?Output 750 ml  ?Net -630 ml  ? ?LBM: Last BM Date : 10/10/21 ?Baseline Weight: Weight: 53.2 kg ?Most recent weight: Weight: 53.2 kg ? ?     ?Palliative Assessment/Data: PPS 50% ? ? ? ?Flowsheet Rows   ? ?Flowsheet Row Most Recent Value  ?Intake Tab   ?Referral Department Hospitalist  ?Unit at Time of Referral Cardiac/Telemetry Unit  ?Palliative Care Primary Diagnosis Cancer  ?Date Notified 10/09/21  ?Palliative Care Type New Palliative care  ?Reason for referral Clarify Goals of Care  ?Date of Admission 10/09/21  ?Date first seen by Palliative Care 10/10/21  ?# of days Palliative referral response time 1 Day(s)  ?# of days IP prior to Palliative referral 0  ?Clinical Assessment   ?Psychosocial & Spiritual Assessment   ?Palliative Care Outcomes   ? ?  ? ? ?Patient Active Problem List  ? Diagnosis Date Noted  ? Pressure injury of skin 10/10/2021  ? Rectal mass 10/09/2021  ? Chronic anticoagulation 10/09/2021  ? Vitamin D deficiency 10/09/2021  ? Moderate protein malnutrition ([place]) 10/09/2021  ? Normocytic anemia 10/09/2021  ? Hyponatremia 10/09/2021  ? Leukocytosis 10/09/2021  ? Aortic calcification (  [place]) 10/09/2021  ? Drug-induced constipation   ? Abdominal pain 08/21/2021  ? UTI (urinary tract infection) 08/21/2021  ? Insulin-requiring or dependent type II diabetes mellitus ([place]) 08/21/2021  ? Interstitial cystitis 08/21/2021  ? Colon polyps 08/21/2021  ? Abnormal computed tomography angiography (CTA) of abdomen and pelvis 08/21/2021  ? Closed displaced fracture of right femoral neck ([place]) 08/03/2021  ? Paroxysmal atrial fibrillation ([place]) 08/03/2021  ? Anxiety and depression 08/03/2021  ? Hypothyroidism 08/03/2021  ? Essential hypertension 08/03/2021  ? Dizzy spells 10/27/2012  ? Shaking spells 10/27/2012  ? Weakness 10/27/2012  ? Rheumatoid arthritis ([place]) 09/10/2011  ? ? ?Palliative Care Assessment & Plan  ? ?HPI: ?88 y.o. female  with past medical history of  anxiety, depression, aortic atherosclerosis, dizzy  spells, hypertension, unspecified heart valve disease, hypothyroidism not on levothyroxine, migraine headaches, and generalized weakness admitted on 10/09/2021 with abdominal pain and rectal pain. Recently admitted and discharged from 08/03/2021 until 08/07/2021 due to right hip fracture, readmitted from 08/21/2021 until 08/23/2021 due to abdominal pain with abnormal CT scan abdomen showing asymmetric thickening of the distal left rectum, treated for UTI and discharged home for follow-up with GI as an outpatient.  This admission patient CT of abdomen and pelvis reveals likely metastatic rectal cancer as evidenced by enlarging rectal mass with extensive perirectal and pelvic retroperitoneal adenopathy, enlarging left adrenal mass, and new enlarging hepatic and pulmonary mets.  There was also an enlarging sclerotic lesion on T11.  Patient met with GI and made decision to focus on comfort and avoid aggressive medical interventions.  Patient has had difficulty with symptom management.  PMT consulted to discuss goals of care. ? ?Assessment: ?Patient reports better pain control -has used oral Dilaudid and feels that it is effective.  Fecal incontinent episodes also and per patient. ?She remains committed to plan to focus on comfort and transition back to her ALF with hospice services. ? ?Addendum: Called back later as patient reports pain is not controlled.  We discussed increasing her dose of oral Dilaudid and she is agreeable.  She is also complaining of bladder fullness and has been unable to urinate.  Requested nurse check bladder scan. ? ?Recommendations/Plan: ?Continue as needed p.o. Dilaudid -IV available as needed (increased dose to 4 mg PO) ?Continue psyllium ?Back to ALF with hospice ?Maintain DNR ?Check bladder scan - need for cath? ? ?Goals of Care and Additional Recommendations: ?Limitations on Scope of Treatment: Avoid Hospitalization, Initiate Comfort Feeding, No Chemotherapy, No Diagnostics, No Radiation, and No  Surgical Procedures ? ?Code Status: ?DNR ? ?Prognosis: ?Poor prognosis related to metastatic rectal cancer, poor p.o. intake ? ?Discharge Planning: ?ALF with hospice ? ?Care plan was discussed with patient ? ?Thank you for allowing the Palliative Medicine Team to assist in the care of this patient. ? ?*Please note that this is a verbal dictation therefore any spelling or grammatical errors are due to the "[place]" system interpretation. ? ?Juel Burrow, DNP, AGNP-C ?Palliative Medicine Team ?Team Phone # [phone]  ?Pager [phone] ? ?

## 2021-10-11 NOTE — Progress Notes (Signed)
Inpatient Diabetes Program Recommendations ? ?AACE/ADA: New Consensus Statement on Inpatient Glycemic Control (2015) ? ?Target Ranges:  Prepandial:   less than 140 mg/dL ?     Peak postprandial:   less than 180 mg/dL (1-2 hours) ?     Critically ill patients:  140 - 180 mg/dL  ? ?Lab Results  ?Component Value Date  ? GLUCAP 202 (H) 10/11/2021  ? HGBA1C 5.2 08/04/2021  ? ? ?Review of Glycemic Control ? ?Diabetes history: DM2 ?Outpatient Diabetes medications: Levemir 9 units QD ?Current orders for Inpatient glycemic control: Levemir 9 QHS ? ?Needs s/s. ? ?Inpatient Diabetes Program Recommendations:   ? ?Add Novolog 0-9 units Q6H ? ?Avoid hypoglycemia ? ?Thank you. ?Lorenda Peck, RD, LDN, CDE ?Inpatient Diabetes Coordinator ?[phone]  ? ? ? ? ?

## 2021-10-11 NOTE — TOC Progression Note (Addendum)
Transition of Care (TOC) - Progression Note  ? ? ?Patient Details  ?Name: ARILYNN BLAKENEY ?MRN: [id number] ?Date of Birth: [birth year] ? ?Transition of Care (TOC) CM/SW Contact  ?Larita Deremer, Marjie Skiff, RN ?Phone Number: ?10/11/2021, 11:10 AM ? ?Clinical Narrative:    ?Spoke with [place] at [employer]. She confirms that pt can return to ALF over the weekend. Juanita's cell number to use over the weekend is [phone]. Pt will need a script for narcotics. Hospice made aware likely weekend discharge. Will call family as well.  ? ?5 Addendum: Spoke with daughter in law Amy to alert of possible weekend dc. She is agreeable as long and pain is under control. ? ? ?  ? Readmission Risk Interventions ? ?  08/22/2021  ? 10:58 AM  ?Readmission Risk Prevention Plan  ?Transportation Screening Complete  ?PCP or Specialist Appt within 5-7 Days Complete  ?Home Care Screening Complete  ? ? ?

## 2021-10-11 NOTE — NC FL2 (Signed)
?[place] MEDICAID FL2 LEVEL OF CARE SCREENING TOOL  ?  ? ?IDENTIFICATION  ?Patient Name: ?Yolanda Franco Birthdate: [birth year] Sex: female Admission Date (Current Location): ?10/09/2021  ?[place] Number: ? [place] ?  Facility and Address:  ?[facility],  [address] [place], [place] ?     Provider Number: ?[id number]  ?Attending Physician Name and Address:  ?Elmarie Shiley, MD ? Relative Name and Phone Number:  ?  ?   ?Current Level of Care: ?Hospital Recommended Level of Care: ?Assisted Living Facility, Other (Comment) (with hospice care through [facility] [place]) Prior Approval Number: ?  ? ?Date Approved/Denied: ?  PASRR Number: ?[id number] A ? ?Discharge Plan: ?SNF ?  ? ?Current Diagnoses: ?Patient Active Problem List  ? Diagnosis Date Noted  ? Pressure injury of skin 10/10/2021  ? Rectal mass 10/09/2021  ? Chronic anticoagulation 10/09/2021  ? Vitamin D deficiency 10/09/2021  ? Moderate protein malnutrition ([place]) 10/09/2021  ? Normocytic anemia 10/09/2021  ? Hyponatremia 10/09/2021  ? Leukocytosis 10/09/2021  ? Aortic calcification ([place]) 10/09/2021  ? Drug-induced constipation   ? Abdominal pain 08/21/2021  ? UTI (urinary tract infection) 08/21/2021  ? Insulin-requiring or dependent type II diabetes mellitus ([place]) 08/21/2021  ? Interstitial cystitis 08/21/2021  ? Colon polyps 08/21/2021  ? Abnormal computed tomography angiography (CTA) of abdomen and pelvis 08/21/2021  ? Closed displaced fracture of right femoral neck ([place]) 08/03/2021  ? Paroxysmal atrial fibrillation ([place]) 08/03/2021  ? Anxiety and depression 08/03/2021  ? Hypothyroidism 08/03/2021  ? Essential hypertension 08/03/2021  ? Dizzy spells 10/27/2012  ? Shaking spells 10/27/2012  ? Weakness 10/27/2012  ? Rheumatoid arthritis ([place]) 09/10/2011  ? ? ?Orientation RESPIRATION BLADDER Height & Weight   ?  ?Self, Time, Situation, Place ? Normal Continent Weight: 53.2 kg ?Height:  '5\' 5"'$  (165.1 cm)  ?BEHAVIORAL  SYMPTOMS/MOOD NEUROLOGICAL BOWEL NUTRITION STATUS  ?    Continent Diet (regular)  ?AMBULATORY STATUS COMMUNICATION OF NEEDS Skin   ?Limited Assist Verbally Normal ?  ?  ?  ?    ?     ?     ? ? ?Personal Care Assistance Level of Assistance  ?Bathing, Feeding, Dressing Bathing Assistance: Limited assistance ?Feeding assistance: Limited assistance ?Dressing Assistance: Limited assistance ?   ? ?Functional Limitations Info  ?Sight, Hearing, Speech Sight Info: Adequate ?Hearing Info: Adequate ?Speech Info: Adequate  ? ? ?SPECIAL CARE FACTORS FREQUENCY  ?    ?  ?  ?  ?  ?  ?  ?   ? ? ?Contractures Contractures Info: Not present  ? ? ?Additional Factors Info  ?Code Status Code Status Info: DNR ?  ?  ?  ?  ?   ? ?Current Medications (10/11/2021):  This is the current hospital active medication list ?Current Facility-Administered Medications  ?Medication Dose Route Frequency Provider Last Rate Last Admin  ? acetaminophen (TYLENOL) tablet 650 mg  650 mg Oral Q6H PRN Reubin Milan, MD   650 mg at 10/10/21 2215  ? Or  ? acetaminophen (TYLENOL) suppository 650 mg  650 mg Rectal Q6H PRN Reubin Milan, MD      ? amLODipine (NORVASC) tablet 5 mg  5 mg Oral Daily Reubin Milan, MD   5 mg at 10/11/21 2202  ? baclofen (LIORESAL) tablet 10 mg  10 mg Oral BID PRN Reubin Milan, MD   10 mg at 10/10/21 1811  ? feeding supplement (BOOST / RESOURCE BREEZE) liquid 1 Container  1 Container Oral TID BM Philis Pique, NP   1 Container at 10/11/21 [phone]  ? feeding supplement (ENSURE ENLIVE / ENSURE PLUS) liquid 237 mL  237 mL Oral BID BM Reubin Milan, MD   237 mL at 10/10/21 1100  ? ferrous gluconate (FERGON) tablet 324 mg  324 mg Oral Q M,W,F Reubin Milan, MD   324 mg at 10/09/21 1222  ? FLUoxetine (PROZAC) capsule 40 mg  40 mg Oral Daily Reubin Milan, MD   40 mg at 10/11/21 6962  ? hydrocortisone (ANUSOL-HC) suppository 25 mg  25 mg Rectal BID Reubin Milan, MD      ? HYDROmorphone  (DILAUDID) injection 0.5 mg  0.5 mg Intravenous Q2H PRN Reubin Milan, MD   0.5 mg at 10/10/21 1816  ? HYDROmorphone (DILAUDID) tablet 2 mg  2 mg Oral Q3H PRN Philis Pique, NP   2 mg at 10/11/21 9528  ? insulin detemir (LEVEMIR) injection 9 Units  9 Units Subcutaneous QHS Reubin Milan, MD   9 Units at 10/10/21 2144  ? lip balm (CARMEX) ointment   Topical PRN Elmarie Shiley, MD   Given at 10/11/21 0944  ? nystatin cream (MYCOSTATIN) 1 application.  1 application. Topical BID Reubin Milan, MD   1 application. at 10/11/21 0946  ? ondansetron (ZOFRAN) tablet 4 mg  4 mg Oral Q6H PRN Reubin Milan, MD      ? Or  ? ondansetron [facility]) injection 4 mg  4 mg Intravenous Q6H PRN Reubin Milan, MD   4 mg at 10/10/21 1034  ? pantoprazole (PROTONIX) EC tablet 40 mg  40 mg Oral Daily Vladimir Crofts, PA-C   40 mg at 10/11/21 4132  ? psyllium (HYDROCIL/METAMUCIL) 1 packet  1 packet Oral Daily Philis Pique, NP   1 packet at 10/11/21 [phone]  ? traZODone (DESYREL) tablet 150 mg  150 mg Oral QHS Reubin Milan, MD   150 mg at 10/10/21 2143  ? ? ? ?Discharge Medications: ?Please see discharge summary for a list of discharge medications. ? ?Relevant Imaging Results: ? ?Relevant Lab Results: ? ? ?Additional Information ?SSN: [id number]. Pt reports she received COVID vaccines. ? ?Leeroy Cha, RN ? ? ? ? ?

## 2021-10-11 NOTE — Progress Notes (Signed)
?PROGRESS NOTE ? ? ? ?Yolanda Franco  ZOX:[id number] DOB: 05-07-1933 DOA: 10/09/2021 ?PCP: [name], MD  ? ?Brief Narrative: ?88 year old with past medical history significant for anxiety, depression, dizzy spell, hypertension, hypothyroidism, not on levothyroxine, migraine, recently admitted for right hip fracture on 08/03/2021.  Subsequently readmitted due to abdominal pain and found to have asymmetric thickening of the distal rectum, she was treated for UTI and discharged to have follow-up with GI as an outpatient.  She presented with worsening rectal pain, weight loss. ? ?CT scan show rectal mass with now evidence of likely metastatic disease.  GI was consulted for further evaluation.  Patient and family has decided to proceed with further evaluation and focus on comfort care.  Appreciate palliative care assistance with pain management ? ?Assessment & Plan: ?  ?Principal Problem: ?  Rectal mass ?Active Problems: ?  Paroxysmal atrial fibrillation ([place]) ?  Insulin-requiring or dependent type II diabetes mellitus ([place]) ?  Anxiety and depression ?  Hypothyroidism ?  Essential hypertension ?  Moderate protein malnutrition ([place]) ?  Normocytic anemia ?  Hyponatremia ?  Leukocytosis ?  Aortic calcification ([facility]) ?  Pressure injury of skin ? ?1-Rectal mass with no evidence of metastatic disease ?-Patient wants to focus and quality and colon for ?-Plan to discharge home with hospice care ?-Continue with pain management, started on oral Dilaudid as needed and IV Dilaudid as needed ?Patient continue to complain of pain. I spoke with palliative plan to increase pain medication doses. Remain in hospital to adjust pain medications. Plan to discharge to ALF with hospice.  ? ?2-Paroxysmal A-fib: Continue to hold anticoagulation, plan to proceed with comfort care.  She is also at risk for bleeding due to rectal mass ? ?3-Anxiety, depression: Continue with trazodone and fluoxetine ?4-Diabetes type 2: Continue with insulin and  sliding scale ?5-Hypertension: Continue with amlodipine ?6-moderate protein caloric malnutrition: Continue with Ensure ?7-leukocytosis could be reactive ? ?Pressure Injury 10/09/21 Sacrum Right;Left;Mid Stage 1 -  Intact skin with non-blanchable redness of a localized area usually over a bony prominence. (Active)  ?10/09/21 1130  ?Location: Sacrum  ?Location Orientation: Right;Left;Mid  ?Staging: Stage 1 -  Intact skin with non-blanchable redness of a localized area usually over a bony prominence.  ?Wound Description (Comments):   ?Present on Admission: Yes  ?Dressing Type Foam - Lift dressing to assess site every shift 10/10/21 2217  ? ? ? ? ?Estimated body mass index is 19.52 kg/m? as calculated from the following: ?  Height as of this encounter: '5\' 5"'$  (1.651 m). ?  Weight as of this encounter: 53.2 kg. ? ? ?DVT prophylaxis: SCD ?Code Status: DNR ?Family Communication: Daughter in law at bedside 5/04 ?Disposition Plan:  ?Status is: Observation ?The patient remains OBS appropriate and will d/c before 2 midnights. ? ? ? ?Consultants:  ?GI ?Palliative ? ?Procedures:  ?None ? ?Antimicrobials:  ? ? ?Subjective: ?She received pain medication. Continue to have pain after she received meds. She doesn't feel ready to go to ALF today. She is still having significant amount of pain.  ? ?Objective: ?Vitals:  ? 10/10/21 2215 10/11/21 0216 10/11/21 0604 10/11/21 0941  ?BP: (!) 152/66 (!) 115/55 136/61 139/66  ?Pulse: 88 83 89 91  ?Resp: '18 18 18 16  '$ ?Temp: 98.2 ?F (36.8 ?C) 97.9 ?F (36.6 ?C) 98.1 ?F (36.7 ?C) 97.6 ?F (36.4 ?C)  ?TempSrc: Oral Oral Oral Oral  ?SpO2: 93% 91% 93% 96%  ?Weight:      ?Height:      ? ? ?  Intake/Output Summary (Last 24 hours) at 10/11/2021 1439 ?Last data filed at 10/10/2021 1900 ?Gross per 24 hour  ?Intake 120 ml  ?Output 750 ml  ?Net -630 ml  ? ? ?Filed Weights  ? 10/09/21 1100  ?Weight: 53.2 kg  ? ? ?Examination: ? ?General exam: NAD ?Respiratory system: CTA ?Cardiovascular system: S 1, S 2  RRR ?Gastrointestinal system: Bs present, soft, nt ?Central nervous system: alert ?Extremities: no edema ? ? ?Data Reviewed: I have personally reviewed following labs and imaging studies ? ?CBC: ?Recent Labs  ?Lab 10/09/21 ?0620 10/10/21 ?0351  ?WBC 15.2* 14.2*  ?NEUTROABS 12.5*  --   ?HGB 11.0* 11.1*  ?HCT 34.2* 34.2*  ?MCV 90.7 90.7  ?PLT 312 347  ? ? ?Basic Metabolic Panel: ?Recent Labs  ?Lab 10/09/21 ?0620 10/10/21 ?0351  ?NA 132* 134*  ?K 4.4 4.6  ?CL 102 100  ?CO2 24 24  ?GLUCOSE 83 118*  ?BUN 9 9  ?CREATININE 0.37* 0.42*  ?CALCIUM 8.1* 8.4*  ? ? ?GFR: ?Estimated Creatinine Clearance: 42.4 mL/min (A) (by C-G formula based on SCr of 0.42 mg/dL (L)). ?Liver Function Tests: ?Recent Labs  ?Lab 10/09/21 ?0620 10/10/21 ?0351  ?AST 40 38  ?ALT 23 19  ?ALKPHOS 155* 212*  ?BILITOT 0.7 0.6  ?PROT 5.6* 5.8*  ?ALBUMIN 2.5* 2.6*  ? ? ?Recent Labs  ?Lab 10/09/21 ?0620  ?LIPASE 22  ? ? ?No results for input(s): AMMONIA in the last 168 hours. ?Coagulation Profile: ?No results for input(s): INR, PROTIME in the last 168 hours. ?Cardiac Enzymes: ?No results for input(s): CKTOTAL, CKMB, CKMBINDEX, TROPONINI in the last 168 hours. ?BNP (last 3 results) ?No results for input(s): PROBNP in the last 8760 hours. ?HbA1C: ?No results for input(s): HGBA1C in the last 72 hours. ?CBG: ?Recent Labs  ?Lab 10/10/21 ?1622 10/10/21 ?2014 10/11/21 ?0729 10/11/21 ?6144 10/11/21 ?1158  ?GLUCAP 141* 126* 67* 101* 202*  ? ? ?Lipid Profile: ?No results for input(s): CHOL, HDL, LDLCALC, TRIG, CHOLHDL, LDLDIRECT in the last 72 hours. ?Thyroid Function Tests: ?No results for input(s): TSH, T4TOTAL, FREET4, T3FREE, THYROIDAB in the last 72 hours. ?Anemia Panel: ?No results for input(s): VITAMINB12, FOLATE, FERRITIN, TIBC, IRON, RETICCTPCT in the last 72 hours. ?Sepsis Labs: ?No results for input(s): PROCALCITON, LATICACIDVEN in the last 168 hours. ? ?Recent Results (from the past 240 hour(s))  ?MRSA Next Gen by PCR, Nasal     Status: None  ? Collection  Time: 10/09/21  7:34 PM  ? Specimen: Nasal Mucosa; Nasal Swab  ?Result Value Ref Range Status  ? MRSA by PCR Next Gen NOT DETECTED NOT DETECTED Final  ?  Comment: (NOTE) ?The GeneXpert MRSA Assay (FDA approved for NASAL specimens only), ?is one component of a comprehensive MRSA colonization surveillance ?program. It is not intended to diagnose MRSA infection nor to guide ?or monitor treatment for MRSA infections. ?Test performance is not FDA approved in patients less than 2 years ?old. ?Performed at [facility], [address] ?[place], [place] [address] ?  ? ?  ? ? ? ? ? ?Radiology Studies: ?No results found. ? ? ? ? ? ?Scheduled Meds: ? amLODipine  5 mg Oral Daily  ? feeding supplement  1 Container Oral TID BM  ? feeding supplement  237 mL Oral BID BM  ? ferrous gluconate  324 mg Oral Q M,W,F  ? FLUoxetine  40 mg Oral Daily  ? hydrocortisone  25 mg Rectal BID  ? insulin detemir  9 Units Subcutaneous QHS  ?  nystatin cream  1 application. Topical BID  ? pantoprazole  40 mg Oral Daily  ? psyllium  1 packet Oral Daily  ? traZODone  150 mg Oral QHS  ? ?Continuous Infusions: ? ? LOS: 0 days  ? ? ?Time spent: 35 minutes ? ? ?Elmarie Shiley, MD ?[facility] ? ? ?If 7PM-7AM, please contact night-coverage ?[email or web address] ? ?10/11/2021, 2:39 PM   ?

## 2021-10-11 NOTE — NC FL2 (Deleted)
?[place] MEDICAID FL2 LEVEL OF CARE SCREENING TOOL  ?  ? ?IDENTIFICATION  ?Patient Name: ?Yolanda Franco Birthdate: May 30, 1933 Sex: female Admission Date (Current Location): ?10/09/2021  ?[place] Number: ? [place] ?  Facility and Address:  ?[facility],  [place], [place] ?     Provider Number: ?[id number]  ?Attending Physician Name and Address:  ?Elmarie Shiley, MD ? Relative Name and Phone Number:  ?  ?   ?Current Level of Care: ?Hospital Recommended Level of Care: ?[place], Other (Comment) (hospice care through [facility]) Prior Approval Number: ?  ? ?Date Approved/Denied: ?  PASRR Number: ?[id number] A ? ?Discharge Plan: ?SNF ?  ? ?Current Diagnoses: ?Patient Active Problem List  ? Diagnosis Date Noted  ? Pressure injury of skin 10/10/2021  ? Rectal mass 10/09/2021  ? Chronic anticoagulation 10/09/2021  ? Vitamin D deficiency 10/09/2021  ? Moderate protein malnutrition ([place]) 10/09/2021  ? Normocytic anemia 10/09/2021  ? Hyponatremia 10/09/2021  ? Leukocytosis 10/09/2021  ? Aortic calcification ([place]) 10/09/2021  ? Drug-induced constipation   ? Abdominal pain 08/21/2021  ? UTI (urinary tract infection) 08/21/2021  ? Insulin-requiring or dependent type II diabetes mellitus ([place]) 08/21/2021  ? Interstitial cystitis 08/21/2021  ? Colon polyps 08/21/2021  ? Abnormal computed tomography angiography (CTA) of abdomen and pelvis 08/21/2021  ? Closed displaced fracture of right femoral neck ([place]) 08/03/2021  ? Paroxysmal atrial fibrillation ([place]) 08/03/2021  ? Anxiety and depression 08/03/2021  ? Hypothyroidism 08/03/2021  ? Essential hypertension 08/03/2021  ? Dizzy spells 10/27/2012  ? Shaking spells 10/27/2012  ? Weakness 10/27/2012  ? Rheumatoid arthritis ([place]) 09/10/2011  ? ? ?Orientation RESPIRATION BLADDER Height & Weight   ?  ?Self, Time, Situation, Place ? Normal Continent Weight: 53.2 kg ?Height:  '5\' 5"'$  (165.1 cm)  ?BEHAVIORAL SYMPTOMS/MOOD  NEUROLOGICAL BOWEL NUTRITION STATUS  ?    Continent Diet (regular)  ?AMBULATORY STATUS COMMUNICATION OF NEEDS Skin   ?Limited Assist Verbally Normal ?  ?  ?  ?    ?     ?     ? ? ?Personal Care Assistance Level of Assistance  ?Bathing, Feeding, Dressing Bathing Assistance: Limited assistance ?Feeding assistance: Limited assistance ?Dressing Assistance: Limited assistance ?   ? ?Functional Limitations Info  ?Sight, Hearing, Speech Sight Info: Adequate ?Hearing Info: Adequate ?Speech Info: Adequate  ? ? ?SPECIAL CARE FACTORS FREQUENCY  ?    ?  ?  ?  ?  ?  ?  ?   ? ? ?Contractures Contractures Info: Not present  ? ? ?Additional Factors Info  ?Code Status Code Status Info: DNR ?  ?  ?  ?  ?   ? ?Current Medications (10/11/2021):  This is the current hospital active medication list ?Current Facility-Administered Medications  ?Medication Dose Route Frequency Provider Last Rate Last Admin  ? acetaminophen (TYLENOL) tablet 650 mg  650 mg Oral Q6H PRN Reubin Milan, MD   650 mg at 10/10/21 2215  ? Or  ? acetaminophen (TYLENOL) suppository 650 mg  650 mg Rectal Q6H PRN Reubin Milan, MD      ? amLODipine (NORVASC) tablet 5 mg  5 mg Oral Daily Reubin Milan, MD   5 mg at 10/11/21 3818  ? baclofen (LIORESAL) tablet 10 mg  10 mg Oral BID PRN Reubin Milan, MD   10 mg at 10/10/21 1811  ? feeding supplement (BOOST / RESOURCE BREEZE) liquid 1 Container  1 Container  Oral TID BM Philis Pique, NP   1 Container at 10/10/21 2215  ? feeding supplement (ENSURE ENLIVE / ENSURE PLUS) liquid 237 mL  237 mL Oral BID BM Reubin Milan, MD   237 mL at 10/10/21 1100  ? ferrous gluconate (FERGON) tablet 324 mg  324 mg Oral Q M,W,F Reubin Milan, MD   324 mg at 10/09/21 1222  ? FLUoxetine (PROZAC) capsule 40 mg  40 mg Oral Daily Reubin Milan, MD   40 mg at 10/11/21 5465  ? hydrocortisone (ANUSOL-HC) suppository 25 mg  25 mg Rectal BID Reubin Milan, MD      ? HYDROmorphone (DILAUDID) injection  0.5 mg  0.5 mg Intravenous Q2H PRN Reubin Milan, MD   0.5 mg at 10/10/21 1816  ? HYDROmorphone (DILAUDID) tablet 2 mg  2 mg Oral Q3H PRN Philis Pique, NP   2 mg at 10/11/21 0354  ? insulin detemir (LEVEMIR) injection 9 Units  9 Units Subcutaneous QHS Reubin Milan, MD   9 Units at 10/10/21 2144  ? lip balm (CARMEX) ointment   Topical PRN Elmarie Shiley, MD   Given at 10/11/21 0944  ? nystatin cream (MYCOSTATIN) 1 application.  1 application. Topical BID Reubin Milan, MD   1 application. at 10/10/21 2215  ? ondansetron (ZOFRAN) tablet 4 mg  4 mg Oral Q6H PRN Reubin Milan, MD      ? Or  ? ondansetron [facility]) injection 4 mg  4 mg Intravenous Q6H PRN Reubin Milan, MD   4 mg at 10/10/21 1034  ? pantoprazole (PROTONIX) EC tablet 40 mg  40 mg Oral Daily Vladimir Crofts, PA-C   40 mg at 10/11/21 6568  ? psyllium (HYDROCIL/METAMUCIL) 1 packet  1 packet Oral Daily Philis Pique, NP   1 packet at 10/11/21 [phone]  ? traZODone (DESYREL) tablet 150 mg  150 mg Oral QHS Reubin Milan, MD   150 mg at 10/10/21 2143  ? ? ? ?Discharge Medications: ?Please see discharge summary for a list of discharge medications. ? ?Relevant Imaging Results: ? ?Relevant Lab Results: ? ? ?Additional Information ?SSN: [id number]. Pt reports she received COVID vaccines. ? ?Leeroy Cha, RN ? ? ? ? ?

## 2021-10-11 NOTE — TOC Progression Note (Addendum)
Transition of Care (TOC) - Progression Note  ? ? ?Patient Details  ?Name: Yolanda Franco ?MRN: [id number] ?Date of Birth: 08-18-1932 ? ?Transition of Care (TOC) CM/SW Contact  ?Leeroy Cha, RN ?Phone Number: ?10/11/2021, 9:34 AM ? ?Clinical Narrative:    ?[place] of the hospice called last night after hours.  Patient has been accepted into program. Plan is to return to [facility]. ?Fl2 sent to [facility] in [place]. ? ?  ?  ? ?Expected Discharge Plan and Services ?  ?  ?  ?  ?  ?                ?  ?  ?  ?  ?  ?  ?  ?  ?  ?  ? ? ?Social Determinants of Health (SDOH) Interventions ?  ? ?Readmission Risk Interventions ? ?  08/22/2021  ? 10:58 AM  ?Readmission Risk Prevention Plan  ?Transportation Screening Complete  ?PCP or Specialist Appt within 5-7 Days Complete  ?Home Care Screening Complete  ? ? ?

## 2021-10-12 DIAGNOSIS — G893 Neoplasm related pain (acute) (chronic): Secondary | ICD-10-CM | POA: Diagnosis not present

## 2021-10-12 DIAGNOSIS — K6289 Other specified diseases of anus and rectum: Secondary | ICD-10-CM | POA: Diagnosis not present

## 2021-10-12 DIAGNOSIS — Z515 Encounter for palliative care: Secondary | ICD-10-CM | POA: Diagnosis not present

## 2021-10-12 LAB — GLUCOSE, CAPILLARY
Glucose-Capillary: 88 mg/dL (ref 70–99)
Glucose-Capillary: 132 mg/dL — ABNORMAL HIGH (ref 70–99)
Glucose-Capillary: 115 mg/dL — ABNORMAL HIGH (ref 70–99)
Glucose-Capillary: 101 mg/dL — ABNORMAL HIGH (ref 70–99)

## 2021-10-12 MED ORDER — FENTANYL 25 MCG/HR TD PT72
1.0000 | MEDICATED_PATCH | TRANSDERMAL | Status: DC
  Administered 2021-10-12: 1 via TRANSDERMAL
  Filled 2021-10-12: qty 1

## 2021-10-12 MED ORDER — HYDROMORPHONE 1 MG/ML IV SOLN
INTRAVENOUS | Status: DC
  Administered 2021-10-12: 0.5 mg via INTRAVENOUS
  Administered 2021-10-12: 30 mg via INTRAVENOUS
  Administered 2021-10-13: 0.1 mg via INTRAVENOUS

## 2021-10-12 MED ORDER — LORAZEPAM 2 MG/ML IJ SOLN
1.0000 mg | INTRAMUSCULAR | Status: DC | PRN
  Administered 2021-10-12 – 2021-10-14 (×4): 1 mg via INTRAVENOUS
  Filled 2021-10-12 (×4): qty 1

## 2021-10-12 MED ORDER — NALOXONE HCL 0.4 MG/ML IJ SOLN: 0.4000 mg | INTRAMUSCULAR | Status: DC | PRN

## 2021-10-12 MED ORDER — HYOSCYAMINE SULFATE 0.125 MG PO TBDP
0.1250 mg | ORAL_TABLET | Freq: Four times a day (QID) | ORAL | Status: DC | PRN
  Filled 2021-10-12: qty 1

## 2021-10-12 MED ORDER — DIPHENHYDRAMINE HCL 50 MG/ML IJ SOLN: 12.5000 mg | Freq: Four times a day (QID) | INTRAMUSCULAR | Status: DC | PRN

## 2021-10-12 MED ORDER — SODIUM CHLORIDE 0.9% FLUSH: 9.0000 mL | INTRAVENOUS | Status: DC | PRN

## 2021-10-12 MED ORDER — HYDROMORPHONE 1 MG/ML IV SOLN
INTRAVENOUS | Status: DC
  Filled 2021-10-12: qty 30

## 2021-10-12 MED ORDER — DIPHENHYDRAMINE HCL 12.5 MG/5ML PO ELIX: 12.5000 mg | ORAL_SOLUTION | Freq: Four times a day (QID) | ORAL | Status: DC | PRN

## 2021-10-12 MED ORDER — DILTIAZEM GEL 2 %
Freq: Two times a day (BID) | CUTANEOUS | Status: DC | PRN
  Filled 2021-10-12: qty 30

## 2021-10-12 MED ORDER — HYDROMORPHONE HCL 1 MG/ML IJ SOLN
0.5000 mg | INTRAMUSCULAR | Status: AC
  Administered 2021-10-12: 0.5 mg via INTRAVENOUS
  Filled 2021-10-12: qty 0.5

## 2021-10-12 MED ORDER — HYDROMORPHONE HCL 1 MG/ML IJ SOLN: 1.0000 mg | INTRAMUSCULAR | Status: DC | PRN

## 2021-10-12 MED ORDER — NIFEDIPINE 10 MG PO CAPS
10.0000 mg | ORAL_CAPSULE | Freq: Two times a day (BID) | ORAL | Status: DC
Start: 2021-10-13 — End: 2021-10-12

## 2021-10-12 NOTE — Progress Notes (Addendum)
?                                                   ?Daily Progress Note  ? ?Patient Name: Yolanda Franco       Date: 10/12/2021 ?DOB: Nov 21, 1932  Age: 88 y.o. MRN#: [id number] ?Attending Physician: Elmarie Shiley, MD ?Primary Care Physician: [name], MD ?Admit Date: 10/09/2021 ? ?Reason for Consultation/Follow-up: Establishing goals of care ? ?Subjective: ?I saw and examined Yolanda Franco today.  She continues to have pain in her rectum reports she feels it has been worse overnight. ? ?She describes the pain as sharp and squeezing in her rectum/colon.  It is very sharp when it onsets and lasts for a few seconds.  It would then go away but later returned.  She does think she gets some relief from pain medication, but is not quick enough to relieve pain whenever it sets on.  Feels that if she keeps up on it, however, she has less spasm overall. ? ?Reviewed her MAR and she has had 22 mg of oral Dilaudid in the last 24 hours which is equivalent to 88 mg of oral morphine. ? ?Length of Stay: 1 ? ?Current Medications: ?Scheduled Meds:  ? amLODipine  5 mg Oral Daily  ? feeding supplement  1 Container Oral TID BM  ? feeding supplement  237 mL Oral BID BM  ? fentaNYL  1 patch Transdermal Q72H  ? ferrous gluconate  324 mg Oral Q M,W,F  ? FLUoxetine  40 mg Oral Daily  ? hydrocortisone  25 mg Rectal BID  ? insulin detemir  9 Units Subcutaneous QHS  ? nystatin cream  1 application. Topical BID  ? pantoprazole  40 mg Oral Daily  ? psyllium  1 packet Oral Daily  ? traZODone  150 mg Oral QHS  ? ? ?Continuous Infusions: ? ? ?PRN Meds: ?acetaminophen **OR** acetaminophen, alum & mag hydroxide-simeth, baclofen, HYDROmorphone (DILAUDID) injection, HYDROmorphone, lip balm, ondansetron **OR** ondansetron (ZOFRAN) IV ? ?Physical Exam ?Constitutional:   ?   General: She is not in acute distress. ?    Appearance: She is ill-appearing.  ?Pulmonary:  ?   Effort: Pulmonary effort is normal.  ?Skin: ?   General: Skin is warm and dry.  ?Neurological:  ?   Mental Status: She is alert.  ?         ? ?Vital Signs: BP 116/77 (BP Location: Left Arm)   Pulse 70   Temp 98 ?F (36.7 ?C) (Oral)   Resp 18   Ht _0  (1.651 m)   Wt 53.2 kg   SpO2 (!) 89%   BMI 19.52 kg/m?  ?SpO2: SpO2: (!) 89 % ?O2 Device: O2 Device: Room Air ?O2 Flow Rate:   ? ?Intake/output summary:  ?Intake/Output Summary (Last 24 hours) at 10/12/2021 1014 ?Last data filed at 10/12/2021 0500 ?Gross per 24 hour  ?Intake 240 ml  ?Output 1350 ml  ?Net -1110 ml  ? ? ?LBM: Last BM Date : 10/09/21 ?Baseline Weight: Weight: 53.2 kg ?Most recent weight: Weight: 53.2 kg ? ?     ?Palliative Assessment/Data: PPS 50% ? ? ? ?Flowsheet Rows   ? ?Flowsheet Row Most Recent Value  ?Intake Tab   ?Referral Department Hospitalist  ?Unit at Time of Referral Cardiac/Telemetry Unit  ?Palliative Care Primary Diagnosis Cancer  ?Date  Notified 10/09/21  ?Palliative Care Type New Palliative care  ?Reason for referral Clarify Goals of Care  ?Date of Admission 10/09/21  ?Date first seen by Palliative Care 10/10/21  ?# of days Palliative referral response time 1 Day(s)  ?# of days IP prior to Palliative referral 0  ?Clinical Assessment   ?Psychosocial & Spiritual Assessment   ?Palliative Care Outcomes   ? ?  ? ? ?Patient Active Problem List  ? Diagnosis Date Noted  ? Abdominal pain 08/21/2021  ? UTI (urinary tract infection) 08/21/2021  ? Abnormal computed tomography angiography (CTA) of abdomen and pelvis 08/21/2021  ? Insulin-requiring or dependent type II diabetes mellitus ([place]) 08/21/2021  ? Colon polyps 08/21/2021  ? Paroxysmal atrial fibrillation ([place]) 08/03/2021  ? Pressure injury of skin 10/10/2021  ? Rectal mass 10/09/2021  ? Chronic anticoagulation 10/09/2021  ? Vitamin D deficiency 10/09/2021  ? Moderate protein malnutrition ([place]) 10/09/2021  ? Normocytic anemia 10/09/2021  ?  Hyponatremia 10/09/2021  ? Leukocytosis 10/09/2021  ? Aortic calcification ([place]) 10/09/2021  ? Drug-induced constipation   ? Interstitial cystitis 08/21/2021  ? Closed displaced fracture of right femoral neck ([place]) 08/03/2021  ? Anxiety and depression 08/03/2021  ? Hypothyroidism 08/03/2021  ? Essential hypertension 08/03/2021  ? Dizzy spells 10/27/2012  ? Shaking spells 10/27/2012  ? Weakness 10/27/2012  ? Rheumatoid arthritis ([place]) 09/10/2011  ? ? ?Palliative Care Assessment & Plan  ? ?HPI: ?88 y.o. female  with past medical history of  anxiety, depression, aortic atherosclerosis, dizzy spells, hypertension, unspecified heart valve disease, hypothyroidism not on levothyroxine, migraine headaches, and generalized weakness admitted on 10/09/2021 with abdominal pain and rectal pain. Recently admitted and discharged from 08/03/2021 until 08/07/2021 due to right hip fracture, readmitted from 08/21/2021 until 08/23/2021 due to abdominal pain with abnormal CT scan abdomen showing asymmetric thickening of the distal left rectum, treated for UTI and discharged home for follow-up with GI as an outpatient.  This admission patient CT of abdomen and pelvis reveals likely metastatic rectal cancer as evidenced by enlarging rectal mass with extensive perirectal and pelvic retroperitoneal adenopathy, enlarging left adrenal mass, and new enlarging hepatic and pulmonary mets.  There was also an enlarging sclerotic lesion on T11.  Patient met with GI and made decision to focus on comfort and avoid aggressive medical interventions.  Patient has had difficulty with symptom management.  PMT consulted to discuss goals of care. ? ?Assessment: ?Pain, cancer related: In talking with her today, it sounds to me as though she is having rectal spasm related to her cancer.  Unfortunately, this can be very difficult type of pain to treat.  She does think that having regular pain medication seems to help so we will plan for addition of fentanyl patch at  25 mcg/h.  I also talked with her about trial of B&O suppository, however, it appears this has been discontinued by the manufacturer.  Will there trial diltiazem 2% gel topically.  If she has no improvement with fentanyl patch, I think we should do a trial of nifedipine 10 mg twice daily.  Blocks can also be effective for this type of pain, and I can reach out and discuss with IR on Monday if we are not having success with medication management of pain. ? ?Goals of Care and Additional Recommendations: ?Limitations on Scope of Treatment: Avoid Hospitalization, Initiate Comfort Feeding, No Chemotherapy, No Diagnostics, No Radiation, and No Surgical Procedures ? ?Code Status: ?DNR ? ?Prognosis: ?Poor prognosis related to metastatic rectal cancer, poor p.o.  intake ? ?Discharge Planning: ?ALF with hospice ? ?Care plan was discussed with patient ? ?Thank you for allowing the Palliative Medicine Team to assist in the care of this patient. ? ?Micheline Rough, MD ?[facility] ?[phone] ? ? ?

## 2021-10-12 NOTE — Progress Notes (Addendum)
Palliative care brief note ? ?Notified by RN that Ms. Yolanda Franco is still in significant pain. ? ?Have been discussing potential options throughout the day with pharmacy.  Appreciate input greatly. ? ?On examining her this evening, she is miserable and restless in bed.  Crying and moaning and stating she just wants pain to stop. ? ?I talked with her and family about management of malignant rectal pain and tenesmus.  She and family both want to focus on making sure she is comfortable and out of pain moving forward.  We discussed initiation of a PCA as she is still miserable despite multiple doses of IV Dilaudid.  Discussed with family regarding complex nature of management of this type of pain. ? ?- Primary goal moving forward is full comfort. ?- Plan for PCA with Dilaudid 0.3 mg with 12-minute lockout.  I did not add a basal rate at this time as we already started her on a fentanyl patch earlier today.  We may need to consider this, however, if pain is not controlled with bolus only dosing. ?-Addition of Ativan as needed for anxiety.  There appears to be a significant anxiety component driving her total pain picture. ?- Will plan to reassess in a.m. regarding blood pressure and heart rate.  Norvasc has been held with consideration for starting either low-dose nifedipine or diltiazem as some case studies report improvement in the symptoms with either diltiazem or nifedipine.   ? [email or web address] ?- Comfort care order placed as this is primary goal moving forward. ? ?Micheline Rough, MD ?[facility] ?[phone] ? ?

## 2021-10-12 NOTE — Progress Notes (Signed)
?PROGRESS NOTE ? ? ? ?Yolanda Franco  DSK:[id number] DOB: 1932/09/17 DOA: 10/09/2021 ?PCP: [name], MD  ? ?Brief Narrative: ?88 year old with past medical history significant for anxiety, depression, dizzy spell, hypertension, hypothyroidism, not on levothyroxine, migraine, recently admitted for right hip fracture on 08/03/2021.  Subsequently readmitted due to abdominal pain and found to have asymmetric thickening of the distal rectum, she was treated for UTI and discharged to have follow-up with GI as an outpatient.  She presented with worsening rectal pain, weight loss. ? ?CT scan show rectal mass with now evidence of likely metastatic disease.  GI was consulted for further evaluation.  Patient and family has decided to proceed with further evaluation and focus on comfort care.  Appreciate palliative care assistance with pain management ? ?Assessment & Plan: ?  ?Principal Problem: ?  Rectal mass ?Active Problems: ?  Paroxysmal atrial fibrillation ([place]) ?  Insulin-requiring or dependent type II diabetes mellitus ([place]) ?  Anxiety and depression ?  Hypothyroidism ?  Essential hypertension ?  Moderate protein malnutrition ([place]) ?  Normocytic anemia ?  Hyponatremia ?  Leukocytosis ?  Aortic calcification ([facility]) ?  Pressure injury of skin ? ?1-Rectal mass with no evidence of metastatic disease ?-Patient wants to focus on  quality of care.  ?-Plan to discharge ALF  with hospice care.  ?-Continue with pain management, started on oral Dilaudid as needed and IV Dilaudid as needed ?-Appreciate Dr [name] help. Fentanyl patch, Cardizem gel.  ? ?2-Paroxysmal A-fib: Continue to hold anticoagulation, plan to proceed with comfort care.  She is also at risk for bleeding due to rectal mass ? ?3-Anxiety, depression: Continue with trazodone and fluoxetine ?4-Diabetes type 2: Continue with insulin and sliding scale ?5-Hypertension: Continue with amlodipine ?6-moderate protein caloric malnutrition: Continue with  Ensure ?7-leukocytosis could be reactive ? ?Pressure Injury 10/09/21 Sacrum Right;Left;Mid Stage 1 -  Intact skin with non-blanchable redness of a localized area usually over a bony prominence. (Active)  ?10/09/21 1130  ?Location: Sacrum  ?Location Orientation: Right;Left;Mid  ?Staging: Stage 1 -  Intact skin with non-blanchable redness of a localized area usually over a bony prominence.  ?Wound Description (Comments):   ?Present on Admission: Yes  ?Dressing Type Foam - Lift dressing to assess site every shift 10/11/21 2140  ? ? ? ? ?Estimated body mass index is 19.52 kg/m? as calculated from the following: ?  Height as of this encounter: '5\' 5"'$  (1.651 m). ?  Weight as of this encounter: 53.2 kg. ? ? ?DVT prophylaxis: SCD ?Code Status: DNR ?Family Communication: Daughter in law at bedside 5/04 ?Disposition Plan:  ?Status is: Observation ?The patient will require care spanning > 2 midnights and should be moved to inpatient because: adjusting medication for pain controlled.  ? ? ? ?Consultants:  ?GI ?Palliative ? ?Procedures:  ?None ? ?Antimicrobials:  ? ? ?Subjective: ?She continue to have pain, not controlled by pain meds.  ? ? ?Objective: ?Vitals:  ? 10/11/21 0941 10/11/21 1446 10/11/21 2132 10/12/21 0520  ?BP: 139/66 (!) 145/77 (!) 118/59 116/77  ?Pulse: 91 92 66 70  ?Resp: '16 16  18  '$ ?Temp: 97.6 ?F (36.4 ?C) 97.6 ?F (36.4 ?C) 97.7 ?F (36.5 ?C) 98 ?F (36.7 ?C)  ?TempSrc: Oral Oral Oral Oral  ?SpO2: 96% 93% 92% (!) 89%  ?Weight:      ?Height:      ? ? ?Intake/Output Summary (Last 24 hours) at 10/12/2021 1408 ?Last data filed at 10/12/2021 0500 ?Gross per 24 hour  ?Intake 240  ml  ?Output 1350 ml  ?Net -1110 ml  ? ? ?Filed Weights  ? 10/09/21 1100  ?Weight: 53.2 kg  ? ? ?Examination: ? ?General exam: NAD ?Respiratory system: CTA ?Cardiovascular system: S 1, S 2 RRR ?Gastrointestinal system: BS present, soft, nt ?Central nervous system: Alert ?Extremities: no edema ? ? ?Data Reviewed: I have personally reviewed following  labs and imaging studies ? ?CBC: ?Recent Labs  ?Lab 10/09/21 ?0620 10/10/21 ?0351  ?WBC 15.2* 14.2*  ?NEUTROABS 12.5*  --   ?HGB 11.0* 11.1*  ?HCT 34.2* 34.2*  ?MCV 90.7 90.7  ?PLT 312 347  ? ? ?Basic Metabolic Panel: ?Recent Labs  ?Lab 10/09/21 ?0620 10/10/21 ?0351  ?NA 132* 134*  ?K 4.4 4.6  ?CL 102 100  ?CO2 24 24  ?GLUCOSE 83 118*  ?BUN 9 9  ?CREATININE 0.37* 0.42*  ?CALCIUM 8.1* 8.4*  ? ? ?GFR: ?Estimated Creatinine Clearance: 42.4 mL/min (A) (by C-G formula based on SCr of 0.42 mg/dL (L)). ?Liver Function Tests: ?Recent Labs  ?Lab 10/09/21 ?0620 10/10/21 ?0351  ?AST 40 38  ?ALT 23 19  ?ALKPHOS 155* 212*  ?BILITOT 0.7 0.6  ?PROT 5.6* 5.8*  ?ALBUMIN 2.5* 2.6*  ? ? ?Recent Labs  ?Lab 10/09/21 ?0620  ?LIPASE 22  ? ? ?No results for input(s): AMMONIA in the last 168 hours. ?Coagulation Profile: ?No results for input(s): INR, PROTIME in the last 168 hours. ?Cardiac Enzymes: ?No results for input(s): CKTOTAL, CKMB, CKMBINDEX, TROPONINI in the last 168 hours. ?BNP (last 3 results) ?No results for input(s): PROBNP in the last 8760 hours. ?HbA1C: ?No results for input(s): HGBA1C in the last 72 hours. ?CBG: ?Recent Labs  ?Lab 10/11/21 ?1158 10/11/21 ?1701 10/11/21 ?2134 10/12/21 ?0804 10/12/21 ?1225  ?GLUCAP 202* 214* 133* 88 132*  ? ? ?Lipid Profile: ?No results for input(s): CHOL, HDL, LDLCALC, TRIG, CHOLHDL, LDLDIRECT in the last 72 hours. ?Thyroid Function Tests: ?No results for input(s): TSH, T4TOTAL, FREET4, T3FREE, THYROIDAB in the last 72 hours. ?Anemia Panel: ?No results for input(s): VITAMINB12, FOLATE, FERRITIN, TIBC, IRON, RETICCTPCT in the last 72 hours. ?Sepsis Labs: ?No results for input(s): PROCALCITON, LATICACIDVEN in the last 168 hours. ? ?Recent Results (from the past 240 hour(s))  ?MRSA Next Gen by PCR, Nasal     Status: None  ? Collection Time: 10/09/21  7:34 PM  ? Specimen: Nasal Mucosa; Nasal Swab  ?Result Value Ref Range Status  ? MRSA by PCR Next Gen NOT DETECTED NOT DETECTED Final  ?  Comment:  (NOTE) ?The GeneXpert MRSA Assay (FDA approved for NASAL specimens only), ?is one component of a comprehensive MRSA colonization surveillance ?program. It is not intended to diagnose MRSA infection nor to guide ?or monitor treatment for MRSA infections. ?Test performance is not FDA approved in patients less than 2 years ?old. ?Performed at [facility] [address] ?[place], [place] [address] ?  ? ?  ? ? ? ? ? ?Radiology Studies: ?No results found. ? ? ? ? ? ?Scheduled Meds: ? amLODipine  5 mg Oral Daily  ? feeding supplement  1 Container Oral TID BM  ? feeding supplement  237 mL Oral BID BM  ? fentaNYL  1 patch Transdermal Q72H  ? ferrous gluconate  324 mg Oral Q M,W,F  ? FLUoxetine  40 mg Oral Daily  ? hydrocortisone  25 mg Rectal BID  ? insulin detemir  9 Units Subcutaneous QHS  ? nystatin cream  1 application. Topical BID  ? pantoprazole  40  mg Oral Daily  ? psyllium  1 packet Oral Daily  ? traZODone  150 mg Oral QHS  ? ?Continuous Infusions: ? ? LOS: 1 day  ? ? ?Time spent: 35 minutes ? ? ?Elmarie Shiley, MD ?[facility] ? ? ?If 7PM-7AM, please contact night-coverage ?[email or web address] ? ?10/12/2021, 2:08 PM   ?

## 2021-10-13 DIAGNOSIS — Z515 Encounter for palliative care: Secondary | ICD-10-CM | POA: Diagnosis not present

## 2021-10-13 DIAGNOSIS — Z7189 Other specified counseling: Secondary | ICD-10-CM | POA: Diagnosis not present

## 2021-10-13 DIAGNOSIS — G893 Neoplasm related pain (acute) (chronic): Secondary | ICD-10-CM | POA: Diagnosis not present

## 2021-10-13 DIAGNOSIS — K6289 Other specified diseases of anus and rectum: Secondary | ICD-10-CM | POA: Diagnosis not present

## 2021-10-13 LAB — GLUCOSE, CAPILLARY
Glucose-Capillary: 70 mg/dL (ref 70–99)
Glucose-Capillary: 85 mg/dL (ref 70–99)
Glucose-Capillary: 84 mg/dL (ref 70–99)

## 2021-10-13 MED ORDER — GLYCOPYRROLATE 0.2 MG/ML IJ SOLN: 0.2000 mg | INTRAMUSCULAR | Status: DC | PRN

## 2021-10-13 MED ORDER — HYDROMORPHONE HCL 1 MG/ML IJ SOLN: 0.5000 mg | INTRAMUSCULAR | Status: DC | PRN

## 2021-10-13 MED ORDER — HALOPERIDOL 0.5 MG PO TABS
0.5000 mg | ORAL_TABLET | ORAL | Status: DC | PRN
Start: 2021-10-13 — End: 2021-10-16

## 2021-10-13 MED ORDER — GLYCOPYRROLATE 1 MG PO TABS
1.0000 mg | ORAL_TABLET | ORAL | Status: DC | PRN
Start: 2021-10-13 — End: 2021-10-16

## 2021-10-13 MED ORDER — HALOPERIDOL LACTATE 2 MG/ML PO CONC
0.5000 mg | ORAL | Status: DC | PRN
  Filled 2021-10-13: qty 5

## 2021-10-13 MED ORDER — BIOTENE DRY MOUTH MT LIQD: 15.0000 mL | OROMUCOSAL | Status: DC | PRN

## 2021-10-13 MED ORDER — HYDROMORPHONE 1 MG/ML IV SOLN
INTRAVENOUS | Status: DC
  Administered 2021-10-13: 1.5 mg via INTRAVENOUS
  Administered 2021-10-13: 0.901 mg via INTRAVENOUS
  Administered 2021-10-13: 2.1 mg via INTRAVENOUS
  Administered 2021-10-14 (×2): 1.49 mg via INTRAVENOUS
  Administered 2021-10-14: 2.27 mg via INTRAVENOUS
  Administered 2021-10-14: 2.68 mg via INTRAVENOUS
  Administered 2021-10-14: 2.45 mg via INTRAVENOUS

## 2021-10-13 MED ORDER — POLYVINYL ALCOHOL 1.4 % OP SOLN: 1.0000 [drp] | Freq: Four times a day (QID) | OPHTHALMIC | Status: DC | PRN

## 2021-10-13 MED ORDER — DILTIAZEM HCL 30 MG PO TABS
30.0000 mg | ORAL_TABLET | Freq: Four times a day (QID) | ORAL | Status: DC
  Administered 2021-10-13 – 2021-10-14 (×4): 30 mg via ORAL
  Filled 2021-10-13 (×4): qty 1

## 2021-10-13 MED ORDER — HALOPERIDOL LACTATE 5 MG/ML IJ SOLN
0.5000 mg | INTRAMUSCULAR | Status: DC | PRN
Start: 2021-10-13 — End: 2021-10-16

## 2021-10-13 NOTE — Progress Notes (Signed)
?PROGRESS NOTE ? ? ? ?Yolanda Franco  HGD:[id number] DOB: 04/28/1933 DOA: 10/09/2021 ?PCP: [name], MD  ? ?Brief Narrative: ?88 year old with past medical history significant for anxiety, depression, dizzy spell, hypertension, hypothyroidism, not on levothyroxine, migraine, recently admitted for right hip fracture on 08/03/2021.  Subsequently readmitted due to abdominal pain and found to have asymmetric thickening of the distal rectum, she was treated for UTI and discharged to have follow-up with GI as an outpatient.  She presented with worsening rectal pain, weight loss. ? ?CT scan show rectal mass with now evidence of likely metastatic disease.  GI was consulted for further evaluation.  Patient and family has decided to proceed with further evaluation and focus on comfort care.  Appreciate palliative care assistance with pain management ? ?Patient continue to have uncontrollable pain. Plan is for full comfort care. Started on PCA pump with basal Bolus.  ? ?Assessment & Plan: ?  ?Principal Problem: ?  Rectal mass ?Active Problems: ?  Paroxysmal atrial fibrillation ([place]) ?  Insulin-requiring or dependent type II diabetes mellitus ([place]) ?  Anxiety and depression ?  Hypothyroidism ?  Essential hypertension ?  Moderate protein malnutrition ([place]) ?  Normocytic anemia ?  Hyponatremia ?  Leukocytosis ?  Aortic calcification ([facility]) ?  Pressure injury of skin ? ?1-Rectal mass with no evidence of metastatic disease ?-Patient wants to focus on  quality of care.  ?-Appreciate Dr [name] help. Fentanyl patch, Cardizem oral. Dilaudid PCA pump.  ?Plan for full comfort care.  ?She will required residential hospice placement, when bed available.  ? ?2-Paroxysmal A-fib: Continue to hold anticoagulation, plan to proceed with comfort care.  She is also at risk for bleeding due to rectal mass ? ?3-Anxiety, depression: Continue with trazodone and fluoxetine ?4-Diabetes type 2: Comfort care , discontinue insulin.  ?5-Hypertension;  discontinue Norvasc.  ?6-moderate protein caloric malnutrition: Continue with Ensure ?7-leukocytosis could be reactive ? ?Pressure Injury 10/09/21 Sacrum Right;Left;Mid Stage 1 -  Intact skin with non-blanchable redness of a localized area usually over a bony prominence. (Active)  ?10/09/21 1130  ?Location: Sacrum  ?Location Orientation: Right;Left;Mid  ?Staging: Stage 1 -  Intact skin with non-blanchable redness of a localized area usually over a bony prominence.  ?Wound Description (Comments):   ?Present on Admission: Yes  ?Dressing Type Foam - Lift dressing to assess site every shift 10/12/21 2015  ? ? ? ? ?Estimated body mass index is 19.52 kg/m? as calculated from the following: ?  Height as of this encounter: '5\' 5"'$  (1.651 m). ?  Weight as of this encounter: 53.2 kg. ? ? ?DVT prophylaxis: SCD ?Code Status: DNR ?Family Communication: Daughter in law at bedside 5/04. Dr [name] has been discussing care with family.  ?Disposition Plan:  ?Status is: Observation ?The patient will require care spanning > 2 midnights and should be moved to inpatient because: adjusting medication for pain controlled.  ? ? ? ?Consultants:  ?GI ?Palliative ? ?Procedures:  ?None ? ?Antimicrobials:  ? ? ?Subjective: ?She is in pain, she forgets to press PCA pump.  ? ?Objective: ?Vitals:  ? 10/13/21 0336 10/13/21 0459 10/13/21 0800 10/13/21 1252  ?BP:  122/74    ?Pulse:  84    ?Resp:  '18 16 14  '$ ?Temp:  (!) 97.4 ?F (36.3 ?C)    ?TempSrc:  Oral    ?SpO2: 93% 90% (!) 0% (!) 0%  ?Weight:      ?Height:      ? ? ?Intake/Output Summary (Last 24 hours) at  10/13/2021 1406 ?Last data filed at 10/13/2021 1210 ?Gross per 24 hour  ?Intake 190.6 ml  ?Output 1400 ml  ?Net -1209.4 ml  ? ? ?Filed Weights  ? 10/09/21 1100  ?Weight: 53.2 kg  ? ? ?Examination: ? ?General exam: In pain  ?Respiratory system: CTA ?Cardiovascular system: S 1, S 2 RRR ?Gastrointestinal system: BS present, soft,. nt ?Central nervous system: Alert ?Extremities: No edema ? ? ?Data  Reviewed: I have personally reviewed following labs and imaging studies ? ?CBC: ?Recent Labs  ?Lab 10/09/21 ?0620 10/10/21 ?0351  ?WBC 15.2* 14.2*  ?NEUTROABS 12.5*  --   ?HGB 11.0* 11.1*  ?HCT 34.2* 34.2*  ?MCV 90.7 90.7  ?PLT 312 347  ? ? ?Basic Metabolic Panel: ?Recent Labs  ?Lab 10/09/21 ?0620 10/10/21 ?0351  ?NA 132* 134*  ?K 4.4 4.6  ?CL 102 100  ?CO2 24 24  ?GLUCOSE 83 118*  ?BUN 9 9  ?CREATININE 0.37* 0.42*  ?CALCIUM 8.1* 8.4*  ? ? ?GFR: ?Estimated Creatinine Clearance: 42.4 mL/min (A) (by C-G formula based on SCr of 0.42 mg/dL (L)). ?Liver Function Tests: ?Recent Labs  ?Lab 10/09/21 ?0620 10/10/21 ?0351  ?AST 40 38  ?ALT 23 19  ?ALKPHOS 155* 212*  ?BILITOT 0.7 0.6  ?PROT 5.6* 5.8*  ?ALBUMIN 2.5* 2.6*  ? ? ?Recent Labs  ?Lab 10/09/21 ?0620  ?LIPASE 22  ? ? ?No results for input(s): AMMONIA in the last 168 hours. ?Coagulation Profile: ?No results for input(s): INR, PROTIME in the last 168 hours. ?Cardiac Enzymes: ?No results for input(s): CKTOTAL, CKMB, CKMBINDEX, TROPONINI in the last 168 hours. ?BNP (last 3 results) ?No results for input(s): PROBNP in the last 8760 hours. ?HbA1C: ?No results for input(s): HGBA1C in the last 72 hours. ?CBG: ?Recent Labs  ?Lab 10/12/21 ?1225 10/12/21 ?1630 10/12/21 ?2133 10/13/21 ?0732 10/13/21 ?1204  ?GLUCAP 132* 115* 101* 70 85  ? ? ?Lipid Profile: ?No results for input(s): CHOL, HDL, LDLCALC, TRIG, CHOLHDL, LDLDIRECT in the last 72 hours. ?Thyroid Function Tests: ?No results for input(s): TSH, T4TOTAL, FREET4, T3FREE, THYROIDAB in the last 72 hours. ?Anemia Panel: ?No results for input(s): VITAMINB12, FOLATE, FERRITIN, TIBC, IRON, RETICCTPCT in the last 72 hours. ?Sepsis Labs: ?No results for input(s): PROCALCITON, LATICACIDVEN in the last 168 hours. ? ?Recent Results (from the past 240 hour(s))  ?MRSA Next Gen by PCR, Nasal     Status: None  ? Collection Time: 10/09/21  7:34 PM  ? Specimen: Nasal Mucosa; Nasal Swab  ?Result Value Ref Range Status  ? MRSA by PCR Next Gen  NOT DETECTED NOT DETECTED Final  ?  Comment: (NOTE) ?The GeneXpert MRSA Assay (FDA approved for NASAL specimens only), ?is one component of a comprehensive MRSA colonization surveillance ?program. It is not intended to diagnose MRSA infection nor to guide ?or monitor treatment for MRSA infections. ?Test performance is not FDA approved in patients less than 2 years ?old. ?Performed at [facility], [address] ?[place], [place] [address] ?  ? ?  ? ? ? ? ? ?Radiology Studies: ?No results found. ? ? ? ? ? ?Scheduled Meds: ? diltiazem  30 mg Oral Q6H  ? fentaNYL  1 patch Transdermal Q72H  ? HYDROmorphone   Intravenous Q4H  ? nystatin cream  1 application. Topical BID  ? pantoprazole  40 mg Oral Daily  ? ?Continuous Infusions: ? ? LOS: 2 days  ? ? ?Time spent: 35 minutes ? ? ?Elmarie Shiley, MD ?[facility] ? ? ?If 7PM-7AM,  please contact night-coverage ?[email or web address] ? ?10/13/2021, 2:06 PM   ?

## 2021-10-13 NOTE — Progress Notes (Signed)
?                                                   ?Daily Progress Note  ? ?Patient Name: Yolanda Franco       Date: 10/13/2021 ?DOB: 11-08-1932  Age: 88 y.o. MRN#: [id number] ?Attending Physician: Elmarie Shiley, MD ?Primary Care Physician: [name], MD ?Admit Date: 10/09/2021 ? ?Reason for Consultation/Follow-up: Establishing goals of care ? ?Subjective: ?I saw and examined Yolanda Franco early this morning.  Family not present at that time.  She reports continuing to have pain saying she only wants to be out of pain. ? ?I then met with her and family again and we discussed continued clinical course and the fact that she continues to be "miserable."  We discussed moving forward and Yolanda Franco tells me that the only thing she wants is to be out of pain.  She has indicated that she is tired and does not want to live in her current miserable state. ? ?We discussed initiation of continuous infusion of medication to work to relieve her pain.  Family expressed that they called and discussed with someone from hospice of the [place] and family is hopeful she can transition to residential hospice for end-of-life care. ? ?Length of Stay: 2 ? ?Current Medications: ?Scheduled Meds:  ? diltiazem  30 mg Oral Q6H  ? fentaNYL  1 patch Transdermal Q72H  ? HYDROmorphone   Intravenous Q4H  ? nystatin cream  1 application. Topical BID  ? pantoprazole  40 mg Oral Daily  ? ? ?Continuous Infusions: ? ? ?PRN Meds: ?acetaminophen **OR** acetaminophen, alum & mag hydroxide-simeth, antiseptic oral rinse, baclofen, [START ON 10/14/2021] diltiazem, glycopyrrolate **OR** glycopyrrolate **OR** glycopyrrolate, haloperidol **OR** haloperidol **OR** haloperidol lactate, HYDROmorphone (DILAUDID) injection, lip balm, LORazepam, [DISCONTINUED] ondansetron **OR** ondansetron (ZOFRAN) IV, polyvinyl alcohol ?  General: Frail chronically ill-appearing, restless and agitated ?CV: Tachycardic ?Lungs: Diminished ? ?Vital Signs: BP 110/66 (BP Location: Right Arm)   Pulse 97   Temp 98.3 ?F (36.8 ?C) (Axillary)   Resp 16   Ht 5' 5"  (1.651 m)   Wt 53.2 kg   SpO2 (!) 0%   BMI 19.52 kg/m?  ?SpO2: SpO2: (!) 0 % ?O2 Device: O2 Device: Nasal Cannula ?O2 Flow Rate: O2 Flow Rate (L/min): 2 L/min ? ?Intake/output summary:  ?Intake/Output Summary (Last 24 hours) at 10/13/2021 1846 ?Last data filed at 10/13/2021 1425 ?Gross per 24 hour  ?Intake 70.6 ml  ?Output 650 ml  ?Net -579.4 ml  ? ? ?LBM: Last BM Date : 10/09/21 ?Baseline Weight: Weight: 53.2 kg ?Most recent weight: Weight: 53.2 kg ? ?     ?Palliative Assessment/Data: PPS 30% ? ? ? ?Flowsheet Rows   ? ?Flowsheet Row Most Recent Value  ?Intake Tab   ?Referral Department Hospitalist  ?Unit at Time of Referral Cardiac/Telemetry Unit  ?Palliative Care Primary Diagnosis Cancer  ?Date Notified 10/09/21  ?Palliative Care Type New Palliative care  ?Reason for referral Clarify Goals of Care  ?Date of Admission 10/09/21  ?Date first seen by Palliative Care 10/10/21  ?# of days Palliative referral response time 1 Day(s)  ?# of days IP prior to Palliative referral 0  ?Clinical Assessment   ?Psychosocial & Spiritual Assessment   ?Palliative Care Outcomes   ? ?  ? ? ?Patient Active Problem List  ?  Diagnosis Date Noted  ? Abdominal pain 08/21/2021  ? UTI (urinary tract infection) 08/21/2021  ? Abnormal computed tomography angiography (CTA) of abdomen and pelvis 08/21/2021  ? Insulin-requiring or dependent type II diabetes mellitus ([place]) 08/21/2021  ? Colon polyps 08/21/2021  ? Paroxysmal atrial fibrillation ([place]) 08/03/2021  ? Pressure injury of skin 10/10/2021  ? Rectal mass 10/09/2021  ? Chronic anticoagulation 10/09/2021  ? Vitamin D deficiency 10/09/2021  ? Moderate protein malnutrition ([place]) 10/09/2021  ? Normocytic anemia 10/09/2021  ? Hyponatremia 10/09/2021  ? Leukocytosis 10/09/2021  ?  Aortic calcification ([place]) 10/09/2021  ? Drug-induced constipation   ? Interstitial cystitis 08/21/2021  ? Closed displaced fracture of right femoral neck ([place]) 08/03/2021  ? Anxiety and depression 08/03/2021  ? Hypothyroidism 08/03/2021  ? Essential hypertension 08/03/2021  ? Dizzy spells 10/27/2012  ? Shaking spells 10/27/2012  ? Weakness 10/27/2012  ? Rheumatoid arthritis ([place]) 09/10/2011  ? ? ?Palliative Care Assessment & Plan  ? ?HPI: ?88 y.o. female  with past medical history of  anxiety, depression, aortic atherosclerosis, dizzy spells, hypertension, unspecified heart valve disease, hypothyroidism not on levothyroxine, migraine headaches, and generalized weakness admitted on 10/09/2021 with abdominal pain and rectal pain. Recently admitted and discharged from 08/03/2021 until 08/07/2021 due to right hip fracture, readmitted from 08/21/2021 until 08/23/2021 due to abdominal pain with abnormal CT scan abdomen showing asymmetric thickening of the distal left rectum, treated for UTI and discharged home for follow-up with GI as an outpatient.  This admission patient CT of abdomen and pelvis reveals likely metastatic rectal cancer as evidenced by enlarging rectal mass with extensive perirectal and pelvic retroperitoneal adenopathy, enlarging left adrenal mass, and new enlarging hepatic and pulmonary mets.  There was also an enlarging sclerotic lesion on T11.  Patient met with GI and made decision to focus on comfort and avoid aggressive medical interventions.  Patient has had difficulty with symptom management.  PMT consulted to discuss goals of care. ? ?Assessment: ?- Pain, cancer related: PCA reviewed and she has used 3.5 mg last 24 hours but still reports poor pain control.  We will plan for initiation of basal rate of 0.3 mg/h.  Trial of diltiazem oral to see if this benefits her at all in regard to rectal pain and cramping. ?- Goals of care: Overall, she still continues to have a high symptom burden and appears to  be declining quickly.  Primary goal moving forward is her comfort and family is hopeful she can transition to residential hospice for aggressive symptom management and likely end-of-life care. ? ?Goals of Care and Additional Recommendations: ?Limitations on Scope of Treatment: Avoid Hospitalization, Initiate Comfort Feeding, No Chemotherapy, No Diagnostics, No Radiation, and No Surgical Procedures ? ?Code Status: ?DNR ? ?Prognosis: ?Poor prognosis related to metastatic rectal cancer, poor p.o. intake ? ?Discharge Planning: ?Residential hospice.  She continues to decline and is not taking in any meaningful nutrition at this point.  Her symptom burden is so high that she is largely somnolent when she has adequate symptom management.  She needs aggressive symptom management needs and I believe she is approaching end-of-life.  She would best be served by residential hospice if it can be arranged. ? ?Care plan was discussed with patient ? ?Thank you for allowing the Palliative Medicine Team to assist in the care of this patient. ? ?Micheline Rough, MD ?[facility] ?[phone] ? ? ?

## 2021-10-14 ENCOUNTER — Telehealth: Payer: Self-pay | Admitting: Orthopedic Surgery

## 2021-10-14 DIAGNOSIS — K6289 Other specified diseases of anus and rectum: Secondary | ICD-10-CM | POA: Diagnosis not present

## 2021-10-14 DIAGNOSIS — Z7189 Other specified counseling: Secondary | ICD-10-CM | POA: Diagnosis not present

## 2021-10-14 DIAGNOSIS — Z515 Encounter for palliative care: Secondary | ICD-10-CM | POA: Diagnosis not present

## 2021-10-14 DIAGNOSIS — G893 Neoplasm related pain (acute) (chronic): Secondary | ICD-10-CM | POA: Diagnosis not present

## 2021-10-14 LAB — GLUCOSE, CAPILLARY
Glucose-Capillary: 99 mg/dL (ref 70–99)
Glucose-Capillary: 88 mg/dL (ref 70–99)
Glucose-Capillary: 107 mg/dL — ABNORMAL HIGH (ref 70–99)

## 2021-10-14 MED ORDER — HYDROMORPHONE BOLUS VIA INFUSION
0.5000 mg | INTRAVENOUS | Status: DC | PRN
  Administered 2021-10-16: 0.5 mg via INTRAVENOUS
  Filled 2021-10-14: qty 1

## 2021-10-14 MED ORDER — SODIUM CHLORIDE 0.9 % IV SOLN
0.5000 mg/h | INTRAVENOUS | Status: DC
  Administered 2021-10-14 – 2021-10-16 (×2): 0.5 mg/h via INTRAVENOUS
  Filled 2021-10-14 (×2): qty 2.5

## 2021-10-14 MED ORDER — LORAZEPAM 2 MG/ML IJ SOLN: 1.0000 mg | INTRAMUSCULAR | Status: DC | PRN

## 2021-10-14 MED ORDER — LORAZEPAM 2 MG/ML IJ SOLN
1.0000 mg | Freq: Four times a day (QID) | INTRAMUSCULAR | Status: DC
  Administered 2021-10-14 – 2021-10-16 (×7): 1 mg via INTRAVENOUS
  Filled 2021-10-14 (×7): qty 1

## 2021-10-14 NOTE — Progress Notes (Signed)
?PROGRESS NOTE ? ? ? ?Yolanda Franco  VZC:[id number] DOB: [birth year] DOA: 10/09/2021 ?PCP: [name], MD  ? ?Brief Narrative: ?88 year old with past medical history significant for anxiety, depression, dizzy spell, hypertension, hypothyroidism, not on levothyroxine, migraine, recently admitted for right hip fracture on 08/03/2021.  Subsequently readmitted due to abdominal pain and found to have asymmetric thickening of the distal rectum, she was treated for UTI and discharged to have follow-up with GI as an outpatient.  She presented with worsening rectal pain, weight loss. ? ?CT scan show rectal mass with now evidence of likely metastatic disease.  GI was consulted for further evaluation.  Patient and family has decided to proceed with further evaluation and focus on comfort care.  Appreciate palliative care assistance with pain management ? ?Patient continue to have uncontrollable pain. Plan is for full comfort care. Started on PCA pump with basal Bolus.  ? ?Transition to comfort care.  ? ?Assessment & Plan: ?  ?Principal Problem: ?  Rectal mass ?Active Problems: ?  Paroxysmal atrial fibrillation ([place]) ?  Insulin-requiring or dependent type II diabetes mellitus ([place]) ?  Anxiety and depression ?  Hypothyroidism ?  Essential hypertension ?  Moderate protein malnutrition ([place]) ?  Normocytic anemia ?  Hyponatremia ?  Leukocytosis ?  Aortic calcification ([facility]) ?  Pressure injury of skin ? ?1-Rectal mass with no evidence of metastatic disease ?-Patient wants to focus on  quality of care.  ?-Appreciate Dr [name] help. Fentanyl patch, Cardizem oral. Dilaudid PCA pump.  ?Plan for full comfort care.  ?She will required residential hospice placement, when bed available.  ?Sleepy this am.  ? ?2-Paroxysmal A-fib: Continue to hold anticoagulation, plan to proceed with comfort care.  She is also at risk for bleeding due to rectal mass ? ?3-Anxiety, depression: Continue with trazodone and fluoxetine ?4-Diabetes type 2:  Comfort care , discontinue insulin.  ?5-Hypertension; discontinue Norvasc.  ?6-moderate protein caloric malnutrition: Continue with Ensure ?7-leukocytosis could be reactive ? ?Pressure Injury 10/09/21 Sacrum Right;Left;Mid Stage 1 -  Intact skin with non-blanchable redness of a localized area usually over a bony prominence. (Active)  ?10/09/21 1130  ?Location: Sacrum  ?Location Orientation: Right;Left;Mid  ?Staging: Stage 1 -  Intact skin with non-blanchable redness of a localized area usually over a bony prominence.  ?Wound Description (Comments):   ?Present on Admission: Yes  ?Dressing Type Foam - Lift dressing to assess site every shift 10/13/21 2009  ? ? ? ? ?Estimated body mass index is 19.52 kg/m? as calculated from the following: ?  Height as of this encounter: '5\' 5"'$  (1.651 m). ?  Weight as of this encounter: 53.2 kg. ? ? ?DVT prophylaxis: SCD ?Code Status: DNR ?Family Communication: Daughter in law at bedside 5/04. Dr [name] has been discussing care with family.  ?Disposition Plan:  ?Status is: Observation ?The patient will require care spanning > 2 midnights and should be moved to inpatient because: Awaiting residential Hospice placement.  ? ? ? ?Consultants:  ?GI ?Palliative ? ?Procedures:  ?None ? ?Antimicrobials:  ? ? ?Subjective: ?She is sleepy.  ? ?Objective: ?Vitals:  ? 10/14/21 0024 10/14/21 0031 10/14/21 1287 10/14/21 0612  ?BP: 128/63   113/64  ?Pulse: 97   83  ?Resp: '14 14 16   '$ ?Temp: (!) 97.5 ?F (36.4 ?C)     ?TempSrc: Oral     ?SpO2: 99%  98%   ?Weight:      ?Height:      ? ? ?Intake/Output Summary (Last 24 hours) at  10/14/2021 1459 ?Last data filed at 10/14/2021 0500 ?Gross per 24 hour  ?Intake --  ?Output 200 ml  ?Net -200 ml  ? ? ?Filed Weights  ? 10/09/21 1100  ?Weight: 53.2 kg  ? ? ?Examination: ? ?General exam: NAD ?Respiratory system: CTA ?Cardiovascular system: S 1, S 2 RRR ?Gastrointestinal system: BS present, soft nt ?Central nervous system: sleepy ?Extremities: no edema ? ? ?Data  Reviewed: I have personally reviewed following labs and imaging studies ? ?CBC: ?Recent Labs  ?Lab 10/09/21 ?0620 10/10/21 ?0351  ?WBC 15.2* 14.2*  ?NEUTROABS 12.5*  --   ?HGB 11.0* 11.1*  ?HCT 34.2* 34.2*  ?MCV 90.7 90.7  ?PLT 312 347  ? ? ?Basic Metabolic Panel: ?Recent Labs  ?Lab 10/09/21 ?0620 10/10/21 ?0351  ?NA 132* 134*  ?K 4.4 4.6  ?CL 102 100  ?CO2 24 24  ?GLUCOSE 83 118*  ?BUN 9 9  ?CREATININE 0.37* 0.42*  ?CALCIUM 8.1* 8.4*  ? ? ?GFR: ?Estimated Creatinine Clearance: 42.4 mL/min (A) (by C-G formula based on SCr of 0.42 mg/dL (L)). ?Liver Function Tests: ?Recent Labs  ?Lab 10/09/21 ?0620 10/10/21 ?0351  ?AST 40 38  ?ALT 23 19  ?ALKPHOS 155* 212*  ?BILITOT 0.7 0.6  ?PROT 5.6* 5.8*  ?ALBUMIN 2.5* 2.6*  ? ? ?Recent Labs  ?Lab 10/09/21 ?0620  ?LIPASE 22  ? ? ?No results for input(s): AMMONIA in the last 168 hours. ?Coagulation Profile: ?No results for input(s): INR, PROTIME in the last 168 hours. ?Cardiac Enzymes: ?No results for input(s): CKTOTAL, CKMB, CKMBINDEX, TROPONINI in the last 168 hours. ?BNP (last 3 results) ?No results for input(s): PROBNP in the last 8760 hours. ?HbA1C: ?No results for input(s): HGBA1C in the last 72 hours. ?CBG: ?Recent Labs  ?Lab 10/13/21 ?0732 10/13/21 ?1204 10/13/21 ?2128 10/14/21 ?7425 10/14/21 ?1213  ?GLUCAP 70 85 84 99 88  ? ? ?Lipid Profile: ?No results for input(s): CHOL, HDL, LDLCALC, TRIG, CHOLHDL, LDLDIRECT in the last 72 hours. ?Thyroid Function Tests: ?No results for input(s): TSH, T4TOTAL, FREET4, T3FREE, THYROIDAB in the last 72 hours. ?Anemia Panel: ?No results for input(s): VITAMINB12, FOLATE, FERRITIN, TIBC, IRON, RETICCTPCT in the last 72 hours. ?Sepsis Labs: ?No results for input(s): PROCALCITON, LATICACIDVEN in the last 168 hours. ? ?Recent Results (from the past 240 hour(s))  ?MRSA Next Gen by PCR, Nasal     Status: None  ? Collection Time: 10/09/21  7:34 PM  ? Specimen: Nasal Mucosa; Nasal Swab  ?Result Value Ref Range Status  ? MRSA by PCR Next Gen NOT  DETECTED NOT DETECTED Final  ?  Comment: (NOTE) ?The GeneXpert MRSA Assay (FDA approved for NASAL specimens only), ?is one component of a comprehensive MRSA colonization surveillance ?program. It is not intended to diagnose MRSA infection nor to guide ?or monitor treatment for MRSA infections. ?Test performance is not FDA approved in patients less than 2 years ?old. ?Performed at [facility], [address] ?[place], [place] [address] ?  ? ?  ? ? ? ? ? ?Radiology Studies: ?No results found. ? ? ? ? ? ?Scheduled Meds: ? diltiazem  30 mg Oral Q6H  ? fentaNYL  1 patch Transdermal Q72H  ? HYDROmorphone   Intravenous Q4H  ? nystatin cream  1 application. Topical BID  ? pantoprazole  40 mg Oral Daily  ? ?Continuous Infusions: ? ? LOS: 3 days  ? ? ?Time spent: 35 minutes ? ? ?Elmarie Shiley, MD ?[facility] ? ? ?If 7PM-7AM, please contact night-coverage ?  [email or web address] ? ?10/14/2021, 2:59 PM   ?

## 2021-10-14 NOTE — Telephone Encounter (Signed)
Patient daughter in law called to cancel [facility] appt.  She has terminal cancer and is on a drip in the hospital, and it is only a matter of days and she wanted to go ahead and let the doctors know and cancel all her appts.  ?

## 2021-10-14 NOTE — Progress Notes (Signed)
? ?  This pt is being followed for d/c plan- We are aware that there is a shift to go to the Hospice [facility] in [facility] now due to her decline and increased sx management needs. We have had out MD Dr. Beverlyn Roux review and she does agree pt is GIP appropriate and we have offered bed to family and they are in agreement. However we do not have the availability at this time to accommodate her. We have offered a bed at our [place] branch family is thinking about this and will let me know. I have updated TOC.  ? ?Webb Silversmith RN [phone]  ?

## 2021-10-14 NOTE — Care Management Important Message (Signed)
Important Message ? ?Patient Details Hospice Patient. ?Name: Yolanda Franco ?MRN: [id number] ?Date of Birth: Dec 10, 1932 ? ? ?Medicare Important Message Given:    ? ? ? ? ?Kerin Salen ?10/14/2021, 10:29 AM ?

## 2021-10-14 NOTE — Progress Notes (Signed)
?                                                   ?Daily Progress Note  ? ?Patient Name: Yolanda Franco       Date: 10/14/2021 ?DOB: [birth year]  Age: 88 y.o. MRN#: [id number] ?Attending Physician: Elmarie Shiley, MD ?Primary Care Physician: [name], MD ?Admit Date: 10/09/2021 ? ?Reason for Consultation/Follow-up: Establishing goals of care ? ?Subjective:   ?I saw and examined Ms. Yolanda Franco early this morning.  Family not present at that time.  Awakened but was restless and moaned in pain. ? ?Met with family and discussed conversion from PCA to drip with RN boluses.  Discussed that I anticipate prognosis at this point to likely be hours to days. ? ?Length of Stay: 3 ? ?Current Medications: ?Scheduled Meds:  ? fentaNYL  1 patch Transdermal Q72H  ? LORazepam  1 mg Intravenous Q6H  ? nystatin cream  1 application. Topical BID  ? ? ?Continuous Infusions: ? HYDROmorphone 0.5 mg/hr (10/14/21 1826)  ? ? ?PRN Meds: ?acetaminophen **OR** acetaminophen, alum & mag hydroxide-simeth, antiseptic oral rinse, baclofen, diltiazem, glycopyrrolate **OR** glycopyrrolate **OR** glycopyrrolate, haloperidol **OR** haloperidol **OR** haloperidol lactate, HYDROmorphone, lip balm, LORazepam, [DISCONTINUED] ondansetron **OR** ondansetron (ZOFRAN) IV, polyvinyl alcohol ? General: Frail chronically ill-appearing, restless and agitated ?CV: Tachycardic ?Lungs: Diminished ? ?Vital Signs: BP 113/64 (BP Location: Left Arm)   Pulse 83   Temp (!) 97.5 ?F (36.4 ?C) (Oral)   Resp 16   Ht _0  (1.651 m)   Wt 53.2 kg   SpO2 98%   BMI 19.52 kg/m?  ?SpO2: SpO2: 98 % ?O2 Device: O2 Device: Nasal Cannula ?O2 Flow Rate: O2 Flow Rate (L/min): 2 L/min ? ?Intake/output summary:  ?Intake/Output Summary (Last 24 hours) at 10/14/2021 1911 ?Last data filed at 10/14/2021 1237 ?Gross per 24 hour  ?Intake 0 ml  ?Output  200 ml  ?Net -200 ml  ? ?LBM: Last BM Date : 10/13/21 ?Baseline Weight: Weight: 53.2 kg ?Most recent weight: Weight: 53.2 kg ? ?     ?Palliative Assessment/Data: PPS 30% ? ? ? ?Flowsheet Rows   ? ?Flowsheet Row Most Recent Value  ?Intake Tab   ?Referral Department Hospitalist  ?Unit at Time of Referral Cardiac/Telemetry Unit  ?Palliative Care Primary Diagnosis Cancer  ?Date Notified 10/09/21  ?Palliative Care Type New Palliative care  ?Reason for referral Clarify Goals of Care  ?Date of Admission 10/09/21  ?Date first seen by Palliative Care 10/10/21  ?# of days Palliative referral response time 1 Day(s)  ?# of days IP prior to Palliative referral 0  ?Clinical Assessment   ?Psychosocial & Spiritual Assessment   ?Palliative Care Outcomes   ? ?  ? ? ?Patient Active Problem List  ? Diagnosis Date Noted  ? Abdominal pain 08/21/2021  ? UTI (urinary tract infection) 08/21/2021  ? Abnormal computed tomography angiography (CTA) of abdomen and pelvis 08/21/2021  ? Insulin-requiring or dependent type II diabetes mellitus ([place]) 08/21/2021  ? Colon polyps 08/21/2021  ? Paroxysmal atrial fibrillation ([place]) 08/03/2021  ? Pressure injury of skin 10/10/2021  ? Rectal mass 10/09/2021  ? Chronic anticoagulation 10/09/2021  ? Vitamin D deficiency 10/09/2021  ? Moderate protein malnutrition ([place]) 10/09/2021  ? Normocytic anemia 10/09/2021  ? Hyponatremia 10/09/2021  ? Leukocytosis 10/09/2021  ? Aortic calcification ([place]) 10/09/2021  ?  Drug-induced constipation   ? Interstitial cystitis 08/21/2021  ? Closed displaced fracture of right femoral neck ([place]) 08/03/2021  ? Anxiety and depression 08/03/2021  ? Hypothyroidism 08/03/2021  ? Essential hypertension 08/03/2021  ? Dizzy spells 10/27/2012  ? Shaking spells 10/27/2012  ? Weakness 10/27/2012  ? Rheumatoid arthritis ([place]) 09/10/2011  ? ? ?Palliative Care Assessment & Plan  ? ?HPI: ?88 y.o. female  with past medical history of  anxiety, depression, aortic atherosclerosis, dizzy spells,  hypertension, unspecified heart valve disease, hypothyroidism not on levothyroxine, migraine headaches, and generalized weakness admitted on 10/09/2021 with abdominal pain and rectal pain. Recently admitted and discharged from 08/03/2021 until 08/07/2021 due to right hip fracture, readmitted from 08/21/2021 until 08/23/2021 due to abdominal pain with abnormal CT scan abdomen showing asymmetric thickening of the distal left rectum, treated for UTI and discharged home for follow-up with GI as an outpatient.  This admission patient CT of abdomen and pelvis reveals likely metastatic rectal cancer as evidenced by enlarging rectal mass with extensive perirectal and pelvic retroperitoneal adenopathy, enlarging left adrenal mass, and new enlarging hepatic and pulmonary mets.  There was also an enlarging sclerotic lesion on T11.  Patient met with GI and made decision to focus on comfort and avoid aggressive medical interventions.  Patient has had difficulty with symptom management.  PMT consulted to discuss goals of care. ? ?Assessment: ?- Pain, cancer related:  She has continued periods of distress but is not really able to effectively utilize PCA at this point.  Has had 11.42m in the last 24 hours total.  Will convert to dilaudid continuous infusion at 0.533mper hour with additional bolus every 30 minutes as needed. ?- Anxiety/agitation: has responded will to Ativan.  Will schedule every 6 hours with additional dose every 3 hours as needed for breakthrough symptoms. ?- Goals of care: Overall, she still continues to have a high symptom burden and appears to be declining quickly.  Primary goal moving forward is her comfort and family is hopeful she can transition to residential hospice for aggressive symptom management and likely end-of-life care. ?- Discussed limited prognosis with family.  Continue to assess appropriateness for transfer to residential hospice daily. ? ?Goals of Care and Additional Recommendations: ?Limitations  on Scope of Treatment: Avoid Hospitalization, Initiate Comfort Feeding, No Chemotherapy, No Diagnostics, No Radiation, and No Surgical Procedures ? ?Code Status: ?DNR ? ?Prognosis: ?Poor prognosis related to metastatic rectal cancer, poor p.o. intake ? ?Discharge Planning: ?Residential hospice.  She continues to decline and is not taking in any meaningful nutrition at this point.  Her symptom burden is so high that she is somnolent when she has adequate symptom management.  She needs aggressive symptom management needs and I believe she is approaching end-of-life.  She would best be served by residential hospice if it can be arranged. ? ?Care plan was discussed with family ? ?Thank you for allowing the Palliative Medicine Team to assist in the care of this patient. ? ?GeMicheline RoughMD ?[facility] ?[phone] ? ?

## 2021-10-15 DIAGNOSIS — Z515 Encounter for palliative care: Secondary | ICD-10-CM

## 2021-10-15 DIAGNOSIS — K6289 Other specified diseases of anus and rectum: Secondary | ICD-10-CM | POA: Diagnosis not present

## 2021-10-15 DIAGNOSIS — Z7189 Other specified counseling: Secondary | ICD-10-CM

## 2021-10-15 MED ORDER — ACETAMINOPHEN 650 MG RE SUPP
650.0000 mg | Freq: Four times a day (QID) | RECTAL | 0 refills | Status: DC | PRN
Start: 2021-10-15 — End: 2023-01-09

## 2021-10-15 MED ORDER — FENTANYL 25 MCG/HR TD PT72: 1.0000 | MEDICATED_PATCH | TRANSDERMAL | 0 refills | Status: DC

## 2021-10-15 MED ORDER — SODIUM CHLORIDE 0.9 % IV SOLN: 0.5000 mg/h | INTRAVENOUS | 0 refills | Status: DC

## 2021-10-15 MED ORDER — GLYCOPYRROLATE 1 MG PO TABS: 1.0000 mg | ORAL_TABLET | ORAL | 0 refills | Status: DC | PRN

## 2021-10-15 MED ORDER — DILTIAZEM GEL 2 %: 1.0000 "application " | Freq: Two times a day (BID) | CUTANEOUS | 0 refills | Status: DC | PRN

## 2021-10-15 NOTE — TOC Transition Note (Signed)
Transition of Care (TOC) - CM/SW Discharge Note ? ? ?Patient Details  ?Name: Yolanda Franco ?MRN: [id number] ?Date of Birth: 1932-08-03 ? ?Transition of Care (TOC) CM/SW Contact:  ?Bhavya Eschete, Marjie Skiff, RN ?Phone Number: ?10/15/2021, 11:34 AM ? ? ?Clinical Narrative:    ? ?Pt to dc to [facility] today via [facility]. [facility] scheduled for 4pm as the PCA will be delivered to the hospice home between 3:30-4pm. Yellow DNR on the chart for transport. RN to call report to [phone]. ? ?  ?  ? ?Readmission Risk Interventions ? ?  08/22/2021  ? 10:58 AM  ?Readmission Risk Prevention Plan  ?Transportation Screening Complete  ?PCP or Specialist Appt within 5-7 Days Complete  ?Home Care Screening Complete  ? ? ? ? ? ?

## 2021-10-15 NOTE — Progress Notes (Signed)
?                                                   ?Daily Progress Note  ? ?Patient Name: Yolanda Franco       Date: 10/15/2021 ?DOB: 1933-03-16  Age: 88 y.o. MRN#: [id number] ?Attending Physician: Elmarie Shiley, MD ?Primary Care Physician: [name], MD ?Admit Date: 10/09/2021 ? ?Reason for Consultation/Follow-up: Establishing goals of care ? ?Subjective:   ?I saw and examined Yolanda Franco  this morning.   ?Discussed with family at bedside about current signs and symptoms.  ? ?Length of Stay: 4 ? ?Current Medications: ?Scheduled Meds:  ? LORazepam  1 mg Intravenous Q6H  ? nystatin cream  1 application. Topical BID  ? ? ?Continuous Infusions: ? HYDROmorphone 0.5 mg/hr (10/14/21 1826)  ? ? ?PRN Meds: ?acetaminophen **OR** acetaminophen, alum & mag hydroxide-simeth, antiseptic oral rinse, baclofen, diltiazem, glycopyrrolate **OR** glycopyrrolate **OR** glycopyrrolate, haloperidol **OR** haloperidol **OR** haloperidol lactate, HYDROmorphone, lip balm, LORazepam, [DISCONTINUED] ondansetron **OR** ondansetron (ZOFRAN) IV, polyvinyl alcohol ? General: Frail chronically ill-appearing, awakens and looks at son in room.  ?CV: Tachycardic ?Lungs: Diminished ? ?Vital Signs: BP (!) 155/69 (BP Location: Left Arm)   Pulse 99   Temp 97.6 ?F (36.4 ?C) (Oral)   Resp 14   Ht 5' 5"  (1.651 m)   Wt 53.2 kg   SpO2 97%   BMI 19.52 kg/m?  ?SpO2: SpO2: 97 % ?O2 Device: O2 Device: Nasal Cannula ?O2 Flow Rate: O2 Flow Rate (L/min): 2 L/min ? ?Intake/output summary:  ?Intake/Output Summary (Last 24 hours) at 10/15/2021 1124 ?Last data filed at 10/15/2021 1039 ?Gross per 24 hour  ?Intake 0 ml  ?Output --  ?Net 0 ml  ? ?LBM: Last BM Date : 10/13/21 ?Baseline Weight: Weight: 53.2 kg ?Most recent weight: Weight: 53.2 kg ? ?     ?Palliative Assessment/Data: PPS 30% ? ? ? ?Flowsheet Rows   ? ?Flowsheet  Row Most Recent Value  ?Intake Tab   ?Referral Department Hospitalist  ?Unit at Time of Referral Cardiac/Telemetry Unit  ?Palliative Care Primary Diagnosis Cancer  ?Date Notified 10/09/21  ?Palliative Care Type New Palliative care  ?Reason for referral Clarify Goals of Care  ?Date of Admission 10/09/21  ?Date first seen by Palliative Care 10/10/21  ?# of days Palliative referral response time 1 Day(s)  ?# of days IP prior to Palliative referral 0  ?Clinical Assessment   ?Psychosocial & Spiritual Assessment   ?Palliative Care Outcomes   ? ?  ? ? ?Patient Active Problem List  ? Diagnosis Date Noted  ? Pressure injury of skin 10/10/2021  ? Rectal mass 10/09/2021  ? Chronic anticoagulation 10/09/2021  ? Vitamin D deficiency 10/09/2021  ? Moderate protein malnutrition ([place]) 10/09/2021  ? Normocytic anemia 10/09/2021  ? Hyponatremia 10/09/2021  ? Leukocytosis 10/09/2021  ? Aortic calcification ([place]) 10/09/2021  ? Drug-induced constipation   ? Abdominal pain 08/21/2021  ? UTI (urinary tract infection) 08/21/2021  ? Insulin-requiring or dependent type II diabetes mellitus ([place]) 08/21/2021  ? Interstitial cystitis 08/21/2021  ? Colon polyps 08/21/2021  ? Abnormal computed tomography angiography (CTA) of abdomen and pelvis 08/21/2021  ? Closed displaced fracture of right femoral neck ([place]) 08/03/2021  ? Paroxysmal atrial fibrillation ([place]) 08/03/2021  ? Anxiety and depression 08/03/2021  ? Hypothyroidism 08/03/2021  ? Essential hypertension 08/03/2021  ?  Dizzy spells 10/27/2012  ? Shaking spells 10/27/2012  ? Weakness 10/27/2012  ? Rheumatoid arthritis ([place]) 09/10/2011  ? ? ?Palliative Care Assessment & Plan  ? ?HPI: ?88 y.o. female  with past medical history of  anxiety, depression, aortic atherosclerosis, dizzy spells, hypertension, unspecified heart valve disease, hypothyroidism not on levothyroxine, migraine headaches, and generalized weakness admitted on 10/09/2021 with abdominal pain and rectal pain. Recently admitted and  discharged from 08/03/2021 until 08/07/2021 due to right hip fracture, readmitted from 08/21/2021 until 08/23/2021 due to abdominal pain with abnormal CT scan abdomen showing asymmetric thickening of the distal left rectum, treated for UTI and discharged home for follow-up with GI as an outpatient.  This admission patient CT of abdomen and pelvis reveals likely metastatic rectal cancer as evidenced by enlarging rectal mass with extensive perirectal and pelvic retroperitoneal adenopathy, enlarging left adrenal mass, and new enlarging hepatic and pulmonary mets.  There was also an enlarging sclerotic lesion on T11.  Patient met with GI and made decision to focus on comfort and avoid aggressive medical interventions.  Patient has had difficulty with symptom management.  PMT consulted to discuss goals of care. ? ?Assessment: ?- Dilaudid infusion ?Residential hospice.  ?  ?Goals of Care and Additional Recommendations: ?Limitations on Scope of Treatment: Avoid Hospitalization, Initiate Comfort Feeding, No Chemotherapy, No Diagnostics, No Radiation, and No Surgical Procedures ? ?Code Status: ?DNR ? ?Prognosis: ?Poor prognosis related to metastatic rectal cancer, poor p.o. intake ? ?Discharge Planning: ?Residential hospice.    ? ?Care plan was discussed with family at bedside.  ? ?Thank you for allowing the Palliative Medicine Team to assist in the care of this patient. ? ?Loistine Chance  , MD ?[place] Team ?[phone] ? ? ? ? ?

## 2021-10-15 NOTE — Discharge Summary (Signed)
Physician Discharge Summary  ?TIONNE CARELLI JSE:[id number] DOB: Jun 26, 1932 DOA: 10/09/2021 ? ?PCP: [name], MD ? ?Admit date: 10/09/2021 ?Discharge date: 10/15/2021 ? ?Admitted From: ALF ?Disposition:  Residential Hospice ? ?Recommendations for Outpatient Follow-up:  ?Comfort care.  ? ?Discharge Condition: Guarded ?CODE STATUS:DNR ?Diet recommendation: Comfort feeding.  ? ?Brief/Interim Summary: ?88 year old with past medical history significant for anxiety, depression, dizzy spell, hypertension, hypothyroidism, not on levothyroxine, migraine, recently admitted for right hip fracture on 08/03/2021.  Subsequently readmitted due to abdominal pain and found to have asymmetric thickening of the distal rectum, she was treated for UTI and discharged to have follow-up with GI as an outpatient.  She presented with worsening rectal pain, weight loss. ?  ?CT scan show rectal mass with now evidence of likely metastatic disease.  GI was consulted for further evaluation.  Patient and family has decided to proceed with further evaluation and focus on comfort care.  Appreciate palliative care assistance with pain management ?  ?Patient continue to have uncontrollable pain. Plan is for full comfort care. Started on PCA pump with basal Bolus.  ?  ?Transition to comfort care.  ? ?1-Rectal mass with no evidence of metastatic disease ?-Patient wants to focus on  quality of care.  ?-Appreciate Dr [name] help. Fentanyl patch, Cardizem oral. Dilaudid continuous infusion.  ?Plan for full comfort care.  ?She will required residential hospice placement, when bed available.  ?Sleepy this am.  ?  ?2-Paroxysmal A-fib: Continue to hold anticoagulation, plan to proceed with comfort care.  She is also at risk for bleeding due to rectal mass ?  ?3-Anxiety, depression: Continue with trazodone and fluoxetine ?4-Diabetes type 2: Comfort care , discontinue insulin.  ?5-Hypertension; discontinue Norvasc.  ?6-moderate protein caloric malnutrition:  Continue with Ensure ?7-leukocytosis could be reactive ? ? ? ? ?Discharge Diagnoses:  ?Principal Problem: ?  Rectal mass ?Active Problems: ?  Paroxysmal atrial fibrillation ([place]) ?  Insulin-requiring or dependent type II diabetes mellitus ([place]) ?  Anxiety and depression ?  Hypothyroidism ?  Essential hypertension ?  Moderate protein malnutrition ([place]) ?  Normocytic anemia ?  Hyponatremia ?  Leukocytosis ?  Aortic calcification ([facility]) ?  Pressure injury of skin ? ? ? ?Discharge Instructions ? ?Discharge Instructions   ? ? Diet - low sodium heart healthy   Complete by: As directed ?  ? Increase activity slowly   Complete by: As directed ?  ? No wound care   Complete by: As directed ?  ? ?  ? ?Allergies as of 10/15/2021   ? ?   Reactions  ? Aspirin Other (See Comments)  ? migraine  ? Codeine   ? Causes Migraines  ? Eggs Or Egg-derived Products   ? Causes Migraine ?Poultry per [facility]  ? ?  ? ?  ?Medication List  ?  ? ?STOP taking these medications   ? ?acetaminophen 500 MG tablet ?Commonly known as: TYLENOL ?Replaced by: acetaminophen 650 MG suppository ?  ?amLODipine 5 MG tablet ?Commonly known as: NORVASC ?  ?apixaban 2.5 MG Tabs tablet ?Commonly known as: ELIQUIS ?  ?Azelastine HCl 137 MCG/SPRAY Soln ?  ?baclofen 10 MG tablet ?Commonly known as: LIORESAL ?  ?feeding supplement Liqd ?  ?ferrous gluconate 324 MG tablet ?Commonly known as: FERGON ?  ?FLUoxetine 40 MG capsule ?Commonly known as: PROZAC ?  ?fluticasone 50 MCG/ACT nasal spray ?Commonly known as: FLONASE ?  ?Glucosamine 500 MG Caps ?  ?guaiFENesin 600 MG 12 hr tablet ?Commonly known as: [place] ?  ?  HYDROCORTISONE ACE (RECTAL) 30 MG Supp ?  ?insulin detemir 100 UNIT/ML injection ?Commonly known as: LEVEMIR ?  ?levothyroxine 25 MCG tablet ?Commonly known as: SYNTHROID ?  ?lidocaine 4 % cream ?Commonly known as: LMX ?  ?loratadine 10 MG tablet ?Commonly known as: CLARITIN ?  ?mirtazapine 15 MG tablet ?Commonly known as: REMERON ?  ?multivitamin with minerals Tabs  tablet ?  ?nystatin cream ?Commonly known as: MYCOSTATIN ?  ?senna-docusate 8.6-50 MG tablet ?Commonly known as: Senokot-S ?  ?traZODone 150 MG tablet ?Commonly known as: DESYREL ?  ? ?  ? ?TAKE these medications   ? ?acetaminophen 650 MG suppository ?Commonly known as: TYLENOL ?Place 1 suppository (650 mg total) rectally every 6 (six) hours as needed for mild pain (or Fever >/= 101). ?Replaces: acetaminophen 500 MG tablet ?  ?diltiazem 2 % Gel ?Apply 1 application. topically 2 (two) times daily as needed (anal spasm). ?  ?fentaNYL 25 MCG/HR ?Commonly known as: [place] ?Place 1 patch onto the skin every 3 (three) days. ?  ?glycopyrrolate 1 MG tablet ?Commonly known as: ROBINUL ?Take 1 tablet (1 mg total) by mouth every 4 (four) hours as needed (excessive secretions). ?  ?HYDROmorphone 25 mg in sodium chloride 0.9 % 47.5 mL ?Inject 0.5 mg/hr into the vein continuous. ?  ? ?  ? ? ?Allergies  ?Allergen Reactions  ? Aspirin Other (See Comments)  ?  migraine  ? Codeine   ?  Causes Migraines  ? Eggs Or Egg-Derived Products   ?  Causes Migraine ? ?Poultry per [facility]  ? ? ?Consultations: ?Palliative ?GI ? ? ?Procedures/Studies: ?CT Abdomen Pelvis W Contrast ? ?Result Date: 10/09/2021 ?CLINICAL DATA:  Increasing abdominal and rectal pain. Possible rectal mass on previous CT abdomen pelvis. Mild leukocytosis. * Tracking Code: BO * EXAM: CT ABDOMEN AND PELVIS WITH CONTRAST TECHNIQUE: Multidetector CT imaging of the abdomen and pelvis was performed using the standard protocol following bolus administration of intravenous contrast. RADIATION DOSE REDUCTION: This exam was performed according to the departmental dose-optimization program which includes automated exposure control, adjustment of the mA and/or kV according to patient size and/or use of iterative reconstruction technique. CONTRAST:  178m OMNIPAQUE IOHEXOL 300 MG/ML  SOLN COMPARISON:  08/21/2021. FINDINGS: Lower chest: New or enlarging basilar pulmonary nodules measure  up to 5 mm in anterior lingula (5/9). Dependent atelectasis bilaterally, left greater than right. No pleural fluid. Atherosclerotic calcification of the aorta. Heart is enlarged. No pericardial effusion. Hepatobiliary: Marked increase in number of mildly hypodense lesions throughout the liver with an index nodule in the dome measuring 1.7 cm (2/9). Gallbladder is unremarkable. No biliary ductal dilatation. Pancreas: Negative. Spleen: Negative. Adrenals/Urinary Tract: Right adrenal gland is unremarkable. Enlarging left adrenal nodule measures 2.0 x 2.2 cm (2/27), previously 1.4 x 1.4 cm. Subcentimeter low-attenuation lesions in the kidneys are too small to characterize. No follow-up necessary. Kidneys are otherwise unremarkable. Ureters are decompressed. Bladder is partially obscured by streak artifact from a right hip arthroplasty. Stomach/Bowel: Stomach, small bowel, appendix and majority of the colon are unremarkable. Progressive irregular and eccentric rectal wall thickening, difficult to accurately measure. New and enlarging perirectal adenopathy, measuring up to 1.3 cm (2/72), previously 8 mm. Extensive perirectal edema and soft tissue thickening. Vascular/Lymphatic: Atherosclerotic calcification of the aorta. Similar small abdominal retroperitoneal lymph nodes. Enlarging pelvic retroperitoneal lymph nodes. Index proximal left common iliac lymph node measures 9 mm (2/48), previously 5 mm. Adenopathy extends both iliac chains, left greater than right, measuring up to 1.4 cm in  the left internal iliac station (2/66), previously 9 mm. Reproductive: Hysterectomy.  No adnexal mass. Other: No free fluid. Mesenteries and peritoneum are otherwise unremarkable. Left hemidiaphragm is elevated, as before. Musculoskeletal: Right hip arthroplasty. Degenerative and postoperative changes in the spine. Dextroconvex scoliosis of the lumbar spine. Enlargement of a sclerotic lesion in the T11 vertebral body. IMPRESSION: 1.  Metastatic rectal cancer as evidenced by an enlarging rectal mass, extensive perirectal and pelvic retroperitoneal adenopathy, enlarging left adrenal mass and new/enlarging hepatic and pulmonary metastases. Enlarg

## 2021-10-16 NOTE — Discharge Summary (Signed)
Physician Discharge Summary  ?Yolanda Franco:[id number] DOB: 1932-08-01 DOA: 10/09/2021 ?Patient seen and examined and seems to be in some amount of discomfort but is redirectable and is getting IV pain meds as well as Ativan-she is stable at this time for discharge to freestanding hospice facility-greatly appreciate Dr. Rowe Pavy input ?PCP: Yolanda Rad, MD ? ?Admit date: 10/09/2021 ?Discharge date: 10/16/2021 ? ?Admitted From: ALF ?Disposition:  Residential Hospice ? ?Recommendations for Outpatient Follow-up:  ?Comfort care.  ? ?Discharge Condition: Guarded ?CODE STATUS:DNR ?Diet recommendation: Comfort feeding.  ? ?Brief/Interim Summary: ?88 year old known anxiety, depression, dizzy spell, hypertension, hypothyroidism, not on levothyroxine, migraine, recently admitted for right hip fracture on 08/03/2021.  Subsequently readmitted due to abdominal pain and found to have asymmetric thickening of the distal rectum, she was treated for UTI and discharged to have follow-up with GI as an outpatient.  She presented with worsening rectal pain, weight loss. ?  ?CT scan show rectal mass with now evidence of likely metastatic disease.  GI was consulted for further evaluation.  Patient and family has decided to proceed with further evaluation and focus on comfort care.  Appreciate palliative care assistance with pain management ?  ?Patient continue to have uncontrollable pain. Plan is for full comfort care. Started on PCA pump with basal Bolus.  ?  ?Transition to comfort care.  ? ?1-Rectal mass with no evidence of metastatic disease ?-Patient wants to focus on  quality of care.  ?-Appreciate Dr [name] help. Fentanyl patch, Cardizem oral. Dilaudid continuous infusion.  ?Plan for full comfort care.  ?She will required residential hospice placement, when bed available.  ?Sleepy this am.  ?  ?2-Paroxysmal A-fib: Continue to hold anticoagulation, plan to proceed with comfort care.  She is also at risk for bleeding due to rectal  mass ?  ?3-Anxiety, depression: Continue with trazodone and fluoxetine ? ?4-Diabetes type 2: Comfort care , discontinue insulin.  ? ?5-Hypertension; discontinue Norvasc.  ? ?6-moderate protein caloric malnutrition: Continue with Ensure ? ?7-leukocytosis could be reactive ? ? ? ? ?Discharge Diagnoses:  ?Principal Problem: ?  Rectal mass ?Active Problems: ?  Paroxysmal atrial fibrillation (Yolanda Franco) ?  Insulin-requiring or dependent type II diabetes mellitus (Yolanda Franco) ?  Anxiety and depression ?  Hypothyroidism ?  Essential hypertension ?  Moderate protein malnutrition (Yolanda Franco) ?  Normocytic anemia ?  Hyponatremia ?  Leukocytosis ?  Aortic calcification ([facility]) ?  Pressure injury of skin ?  Palliative care by specialist ?  Goals of care, counseling/discussion ? ? ? ?Discharge Instructions ? ?Discharge Instructions   ? ? Diet - low sodium heart healthy   Complete by: As directed ?  ? Increase activity slowly   Complete by: As directed ?  ? No wound care   Complete by: As directed ?  ? ?  ? ?Allergies as of 10/16/2021   ? ?   Reactions  ? Aspirin Other (See Comments)  ? migraine  ? Codeine   ? Causes Migraines  ? Eggs Or Egg-derived Products   ? Causes Migraine ?Poultry per [facility]  ? ?  ? ?  ?Medication List  ?  ? ?STOP taking these medications   ? ?acetaminophen 500 MG tablet ?Commonly known as: TYLENOL ?Replaced by: acetaminophen 650 MG suppository ?  ?amLODipine 5 MG tablet ?Commonly known as: NORVASC ?  ?apixaban 2.5 MG Tabs tablet ?Commonly known as: ELIQUIS ?  ?Azelastine HCl 137 MCG/SPRAY Soln ?  ?baclofen 10 MG tablet ?Commonly known as: LIORESAL ?  ?feeding supplement Liqd ?  ?  ferrous gluconate 324 MG tablet ?Commonly known as: FERGON ?  ?FLUoxetine 40 MG capsule ?Commonly known as: PROZAC ?  ?fluticasone 50 MCG/ACT nasal spray ?Commonly known as: FLONASE ?  ?Glucosamine 500 MG Caps ?  ?guaiFENesin 600 MG 12 hr tablet ?Commonly known as: [place] ?  ?HYDROCORTISONE ACE (RECTAL) 30 MG Supp ?  ?insulin detemir 100 UNIT/ML  injection ?Commonly known as: LEVEMIR ?  ?levothyroxine 25 MCG tablet ?Commonly known as: SYNTHROID ?  ?lidocaine 4 % cream ?Commonly known as: LMX ?  ?loratadine 10 MG tablet ?Commonly known as: CLARITIN ?  ?mirtazapine 15 MG tablet ?Commonly known as: REMERON ?  ?multivitamin with minerals Tabs tablet ?  ?nystatin cream ?Commonly known as: MYCOSTATIN ?  ?senna-docusate 8.6-50 MG tablet ?Commonly known as: Senokot-S ?  ?traZODone 150 MG tablet ?Commonly known as: DESYREL ?  ? ?  ? ?TAKE these medications   ? ?acetaminophen 650 MG suppository ?Commonly known as: TYLENOL ?Place 1 suppository (650 mg total) rectally every 6 (six) hours as needed for mild pain (or Fever >/= 101). ?Replaces: acetaminophen 500 MG tablet ?  ?diltiazem 2 % Gel ?Apply 1 application. topically 2 (two) times daily as needed (anal spasm). ?  ?fentaNYL 25 MCG/HR ?Commonly known as: [place] ?Place 1 patch onto the skin every 3 (three) days. ?  ?glycopyrrolate 1 MG tablet ?Commonly known as: ROBINUL ?Take 1 tablet (1 mg total) by mouth every 4 (four) hours as needed (excessive secretions). ?  ?HYDROmorphone 25 mg in sodium chloride 0.9 % 47.5 mL ?Inject 0.5 mg/hr into the vein continuous. ?  ? ?  ? ? ?Allergies  ?Allergen Reactions  ? Aspirin Other (See Comments)  ?  migraine  ? Codeine   ?  Causes Migraines  ? Eggs Or Egg-Derived Products   ?  Causes Migraine ? ?Poultry per [facility]  ? ? ?Consultations: ?Palliative ?GI ? ? ?Procedures/Studies: ?CT Abdomen Pelvis W Contrast ? ?Result Date: 10/09/2021 ?CLINICAL DATA:  Increasing abdominal and rectal pain. Possible rectal mass on previous CT abdomen pelvis. Mild leukocytosis. * Tracking Code: BO * EXAM: CT ABDOMEN AND PELVIS WITH CONTRAST TECHNIQUE: Multidetector CT imaging of the abdomen and pelvis was performed using the standard protocol following bolus administration of intravenous contrast. RADIATION DOSE REDUCTION: This exam was performed according to the departmental dose-optimization program  which includes automated exposure control, adjustment of the mA and/or kV according to patient size and/or use of iterative reconstruction technique. CONTRAST:  146m OMNIPAQUE IOHEXOL 300 MG/ML  SOLN COMPARISON:  08/21/2021. FINDINGS: Lower chest: New or enlarging basilar pulmonary nodules measure up to 5 mm in anterior lingula (5/9). Dependent atelectasis bilaterally, left greater than right. No pleural fluid. Atherosclerotic calcification of the aorta. Heart is enlarged. No pericardial effusion. Hepatobiliary: Marked increase in number of mildly hypodense lesions throughout the liver with an index nodule in the dome measuring 1.7 cm (2/9). Gallbladder is unremarkable. No biliary ductal dilatation. Pancreas: Negative. Spleen: Negative. Adrenals/Urinary Tract: Right adrenal gland is unremarkable. Enlarging left adrenal nodule measures 2.0 x 2.2 cm (2/27), previously 1.4 x 1.4 cm. Subcentimeter low-attenuation lesions in the kidneys are too small to characterize. No follow-up necessary. Kidneys are otherwise unremarkable. Ureters are decompressed. Bladder is partially obscured by streak artifact from a right hip arthroplasty. Stomach/Bowel: Stomach, small bowel, appendix and majority of the colon are unremarkable. Progressive irregular and eccentric rectal wall thickening, difficult to accurately measure. New and enlarging perirectal adenopathy, measuring up to 1.3 cm (2/72), previously 8 mm. Extensive perirectal edema  and soft tissue thickening. Vascular/Lymphatic: Atherosclerotic calcification of the aorta. Similar small abdominal retroperitoneal lymph nodes. Enlarging pelvic retroperitoneal lymph nodes. Index proximal left common iliac lymph node measures 9 mm (2/48), previously 5 mm. Adenopathy extends both iliac chains, left greater than right, measuring up to 1.4 cm in the left internal iliac station (2/66), previously 9 mm. Reproductive: Hysterectomy.  No adnexal mass. Other: No free fluid. Mesenteries and  peritoneum are otherwise unremarkable. Left hemidiaphragm is elevated, as before. Musculoskeletal: Right hip arthroplasty. Degenerative and postoperative changes in the spine. Dextroconvex scoliosis of the lumbar spin

## 2021-10-16 NOTE — Progress Notes (Signed)
?                                                   ?Daily Progress Note  ? ?Patient Name: Yolanda Franco       Date: 10/16/2021 ?DOB: 11/01/1932  Age: 88 y.o. MRN#: [id number] ?Attending Physician: Nita Sells, MD ?Primary Care Physician: [name], MD ?Admit Date: 10/09/2021 ? ?Reason for Consultation/Follow-up: Establishing goals of care ? ?Subjective:   ?I saw and examined Yolanda Franco  this morning.   ?Discussed with family at bedside about current signs and symptoms.  ?She awakens when her name is called, hands and feet still warm to touch, no coolness, no mottling. She is on Dilaudid infusion, she has episodic pain and discomfort.  ? ?Length of Stay: 5 ? ?Current Medications: ?Scheduled Meds:  ? LORazepam  1 mg Intravenous Q6H  ? nystatin cream  1 application. Topical BID  ? ? ?Continuous Infusions: ? HYDROmorphone 0.5 mg/hr (10/16/21 0649)  ? ? ?PRN Meds: ?acetaminophen **OR** acetaminophen, alum & mag hydroxide-simeth, antiseptic oral rinse, baclofen, diltiazem, glycopyrrolate **OR** glycopyrrolate **OR** glycopyrrolate, haloperidol **OR** haloperidol **OR** haloperidol lactate, HYDROmorphone, lip balm, LORazepam, [DISCONTINUED] ondansetron **OR** ondansetron (ZOFRAN) IV, polyvinyl alcohol ? General: Frail chronically ill-appearing, awakens when name is called.  ?CV: Tachycardic ?Lungs: Diminished ? ?Vital Signs: BP 128/69 (BP Location: Left Arm)   Pulse 78   Temp 98.7 ?F (37.1 ?C) (Oral)   Resp 14   Ht 5' 5"  (1.651 m)   Wt 53.2 kg   SpO2 98%   BMI 19.52 kg/m?  ?SpO2: SpO2: 98 % ?O2 Device: O2 Device: Nasal Cannula ?O2 Flow Rate: O2 Flow Rate (L/min): 2 L/min ? ?Intake/output summary:  ?Intake/Output Summary (Last 24 hours) at 10/16/2021 0913 ?Last data filed at 10/16/2021 0800 ?Gross per 24 hour  ?Intake 37.49 ml  ?Output 351 ml  ?Net -313.51 ml  ? ?LBM:  Last BM Date : 10/15/21 ?Baseline Weight: Weight: 53.2 kg ?Most recent weight: Weight: 53.2 kg ? ?     ?Palliative Assessment/Data: PPS 30% ? ? ? ?Flowsheet Rows   ? ?Flowsheet Row Most Recent Value  ?Intake Tab   ?Referral Department Hospitalist  ?Unit at Time of Referral Cardiac/Telemetry Unit  ?Palliative Care Primary Diagnosis Cancer  ?Date Notified 10/09/21  ?Palliative Care Type New Palliative care  ?Reason for referral Clarify Goals of Care  ?Date of Admission 10/09/21  ?Date first seen by Palliative Care 10/10/21  ?# of days Palliative referral response time 1 Day(s)  ?# of days IP prior to Palliative referral 0  ?Clinical Assessment   ?Psychosocial & Spiritual Assessment   ?Palliative Care Outcomes   ? ?  ? ? ?Patient Active Problem List  ? Diagnosis Date Noted  ? Palliative care by specialist   ? Goals of care, counseling/discussion   ? Pressure injury of skin 10/10/2021  ? Rectal mass 10/09/2021  ? Chronic anticoagulation 10/09/2021  ? Vitamin D deficiency 10/09/2021  ? Moderate protein malnutrition ([place]) 10/09/2021  ? Normocytic anemia 10/09/2021  ? Hyponatremia 10/09/2021  ? Leukocytosis 10/09/2021  ? Aortic calcification ([place]) 10/09/2021  ? Drug-induced constipation   ? Abdominal pain 08/21/2021  ? UTI (urinary tract infection) 08/21/2021  ? Insulin-requiring or dependent type II diabetes mellitus ([place]) 08/21/2021  ? Interstitial cystitis 08/21/2021  ? Colon polyps 08/21/2021  ? Abnormal computed  tomography angiography (CTA) of abdomen and pelvis 08/21/2021  ? Closed displaced fracture of right femoral neck ([place]) 08/03/2021  ? Paroxysmal atrial fibrillation ([place]) 08/03/2021  ? Anxiety and depression 08/03/2021  ? Hypothyroidism 08/03/2021  ? Essential hypertension 08/03/2021  ? Dizzy spells 10/27/2012  ? Shaking spells 10/27/2012  ? Weakness 10/27/2012  ? Rheumatoid arthritis ([place]) 09/10/2011  ? ? ?Palliative Care Assessment & Plan  ? ?HPI: ?88 y.o. female  with past medical history of  anxiety,  depression, aortic atherosclerosis, dizzy spells, hypertension, unspecified heart valve disease, hypothyroidism not on levothyroxine, migraine headaches, and generalized weakness admitted on 10/09/2021 with abdominal pain and rectal pain. Recently admitted and discharged from 08/03/2021 until 08/07/2021 due to right hip fracture, readmitted from 08/21/2021 until 08/23/2021 due to abdominal pain with abnormal CT scan abdomen showing asymmetric thickening of the distal left rectum, treated for UTI and discharged home for follow-up with GI as an outpatient.  This admission patient CT of abdomen and pelvis reveals likely metastatic rectal cancer as evidenced by enlarging rectal mass with extensive perirectal and pelvic retroperitoneal adenopathy, enlarging left adrenal mass, and new enlarging hepatic and pulmonary mets.  There was also an enlarging sclerotic lesion on T11.  Patient met with GI and made decision to focus on comfort and avoid aggressive medical interventions.  Patient has had difficulty with symptom management.  PMT consulted to discuss goals of care. ? ?Assessment: ?- Dilaudid infusion ?Residential hospice.  ?  ?Goals of Care and Additional Recommendations: ?Limitations on Scope of Treatment: Avoid Hospitalization, Initiate Comfort Feeding, No Chemotherapy, No Diagnostics, No Radiation, and No Surgical Procedures ? ?Code Status: ?DNR ? ?Prognosis: ?Poor prognosis related to metastatic rectal cancer, poor p.o. intake, likely few days in my opinion.  ? ?Discharge Planning: ?Residential hospice.    ? ?Care plan was discussed with family at bedside.  ? ?Thank you for allowing the Palliative Medicine Team to assist in the care of this patient. ? ?Yolanda Franco  , MD ?[place] Team ?[phone] ? ? ? ? ?

## 2021-10-16 NOTE — Progress Notes (Signed)
Patient ID: Yolanda Franco, female   DOB: [birth year], 88 y.o.   MRN: [id number] ?Report called to [place] at [facility]. Awaiting [facility] transport. Patient will leave with IV intact. ? ?Haydee Salter, RN ? ?

## 2021-10-16 NOTE — TOC Progression Note (Signed)
Transition of Care (TOC) - Progression Note  ? ? ?Patient Details  ?Name: REMINGTYN DEPAOLA ?MRN: [id number] ?Date of Birth: Apr 30, 1933 ? ?Transition of Care (TOC) CM/SW Contact  ?Brandun Pinn, Marjie Skiff, RN ?Phone Number: ?10/16/2021, 9:43 AM ? ?Clinical Narrative:    ? ?Spoke with attending MD, Palliative MD and [facility]. Everyone is in agreement that pt is ready for transport to [facility]. [facility] contacted for transport. RN to call report to [phone]. ? ?   ?              ?  ?  ?  ?Readmission Risk Interventions ? ?  08/22/2021  ? 10:58 AM  ?Readmission Risk Prevention Plan  ?Transportation Screening Complete  ?PCP or Specialist Appt within 5-7 Days Complete  ?Home Care Screening Complete  ? ? ?

## 2021-10-23 ENCOUNTER — Ambulatory Visit: Payer: Medicare Other | Admitting: Internal Medicine

## 2021-10-25 DIAGNOSIS — I10 Essential (primary) hypertension: Secondary | ICD-10-CM | POA: Diagnosis not present

## 2021-10-25 DIAGNOSIS — G4700 Insomnia, unspecified: Secondary | ICD-10-CM | POA: Diagnosis not present

## 2021-10-25 DIAGNOSIS — Z0001 Encounter for general adult medical examination with abnormal findings: Secondary | ICD-10-CM | POA: Diagnosis not present

## 2021-10-25 DIAGNOSIS — E785 Hyperlipidemia, unspecified: Secondary | ICD-10-CM | POA: Diagnosis not present

## 2021-11-07 DEATH — deceased

## 2022-01-07 ENCOUNTER — Encounter: Payer: Medicare Other | Admitting: Orthopedic Surgery

## 2023-09-03 IMAGING — CT CT ABD-PELV W/ CM
2 of 8 series · 15 of 46 positions shown, 17 images · IV contrast (OMNIPAQUE 300)
Comparison: 08/21/2021.

CLINICAL DATA: Increasing abdominal and rectal pain. Possible
rectal mass on previous CT abdomen pelvis. Mild leukocytosis. *
Tracking Code: BO *

EXAM:
CT ABDOMEN AND PELVIS WITH CONTRAST
TECHNIQUE: Multidetector CT imaging of the abdomen and pelvis was performed
using the standard protocol following bolus administration of
intravenous contrast.

[Series 2: axial st · axial · 0.70mm/px · z∈[+1183,+1568]mm · 12 of 89 slices shown, 14 images]
[im 6/89  soft-tissue]
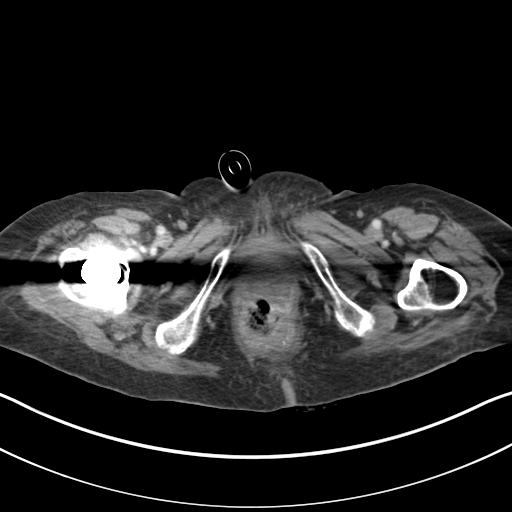
[im 6/89  bone]
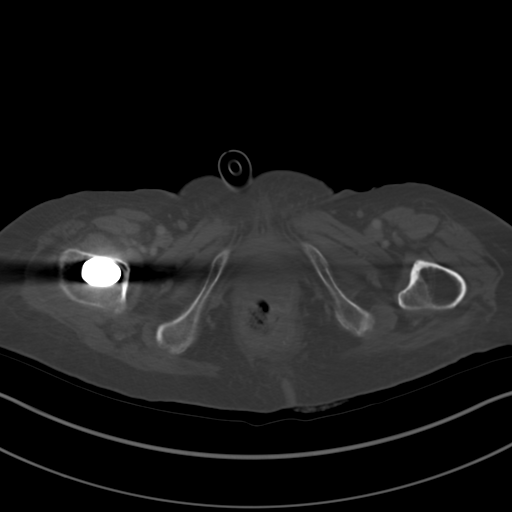
[im 12/89  soft-tissue]
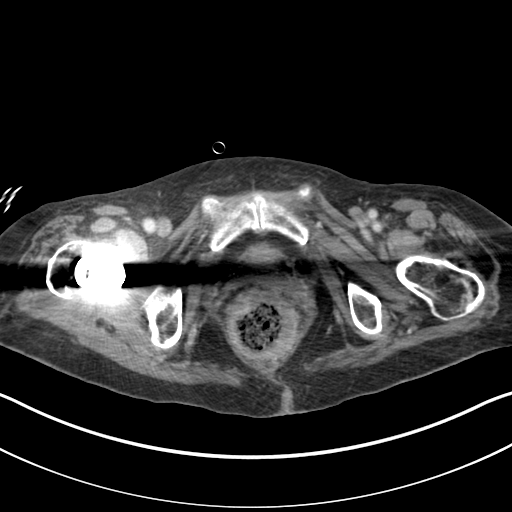
[im 18/89  soft-tissue]
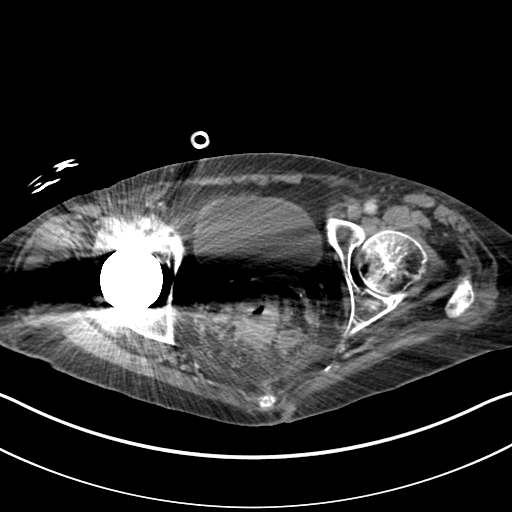
[im 30/89  soft-tissue]
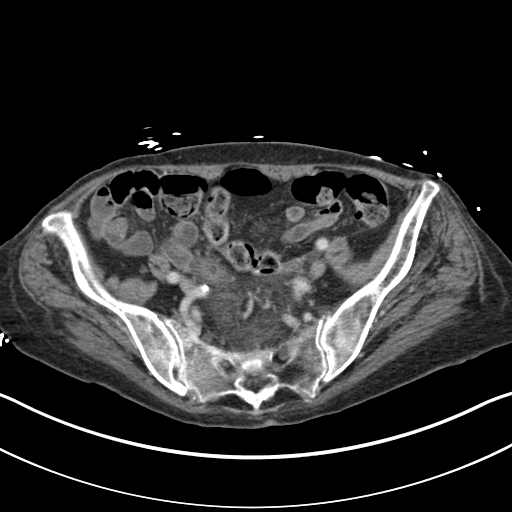
[im 36/89  soft-tissue]
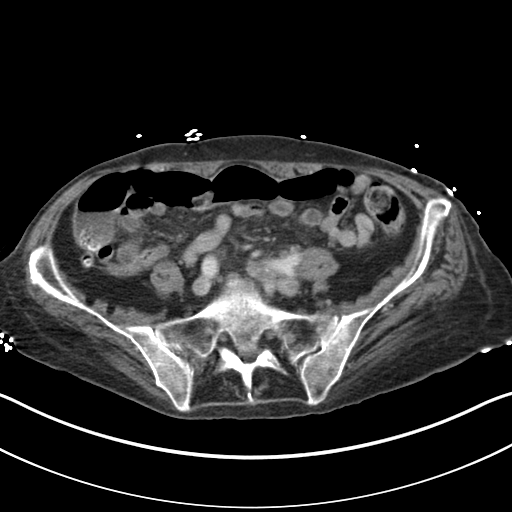
[im 42/89  soft-tissue]
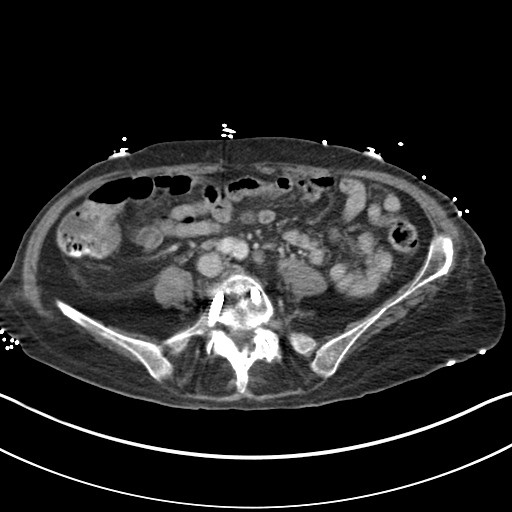
[im 47/89  soft-tissue]
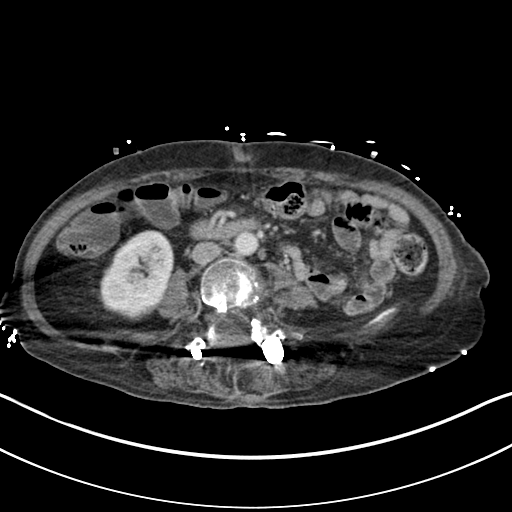
[im 53/89  soft-tissue]
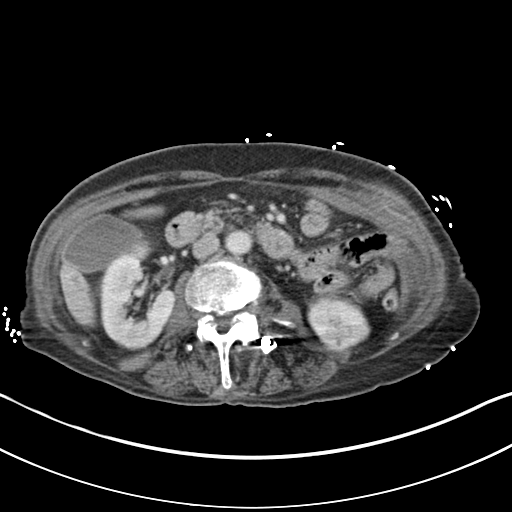
[im 59/89  soft-tissue]
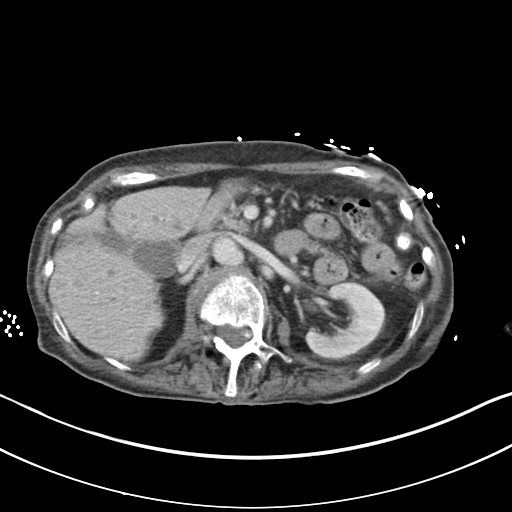
[im 59/89  bone]
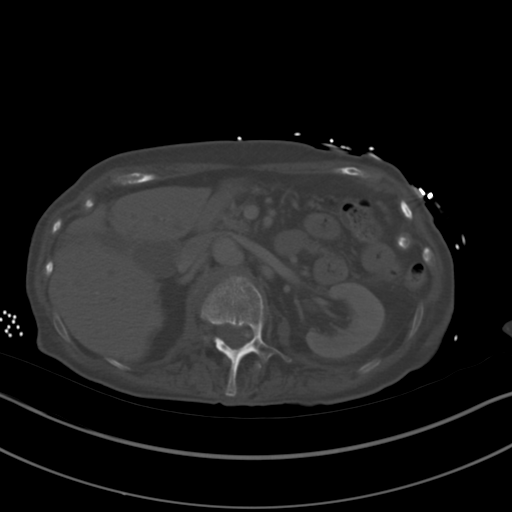
[im 71/89  soft-tissue]
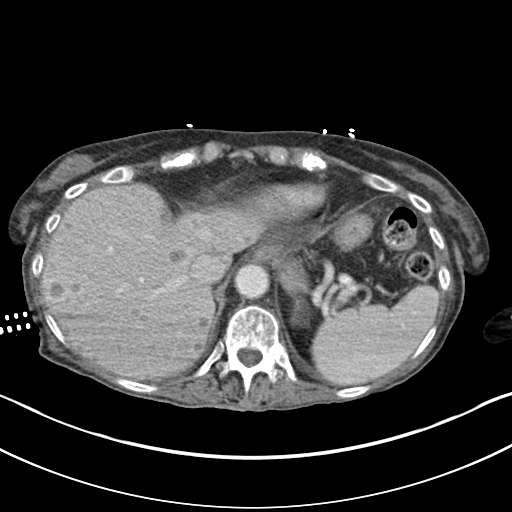
[im 77/89  soft-tissue]
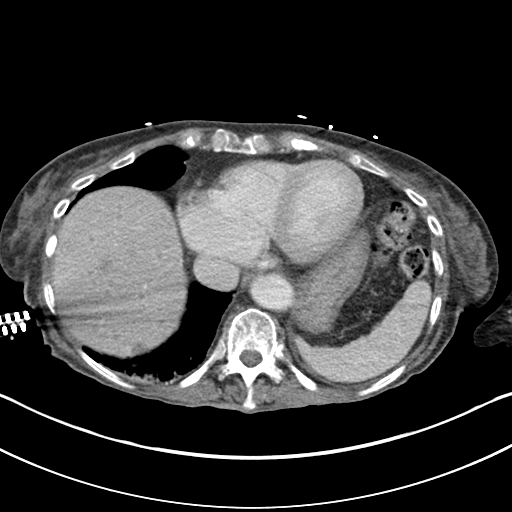
[im 83/89  soft-tissue]
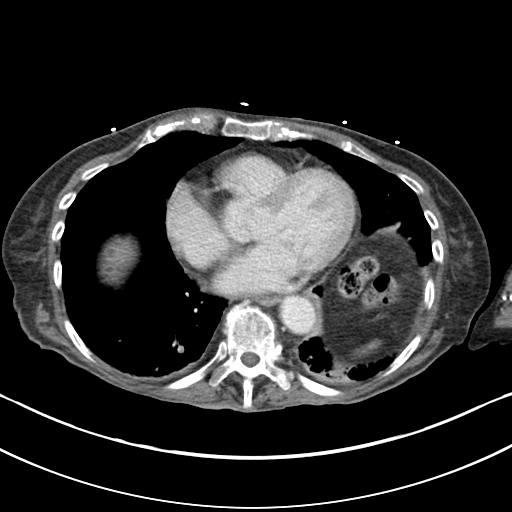

[Series 3: coronal st · coronal · 0.74mm/px · 3 of 111 slices shown]
[im 23/111  soft-tissue]
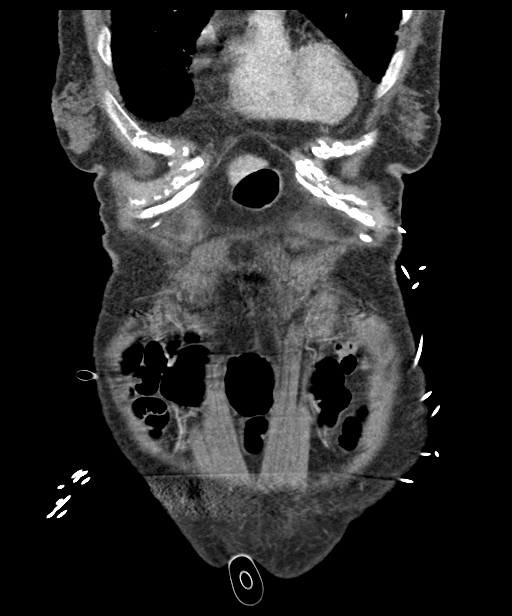
[im 45/111  soft-tissue]
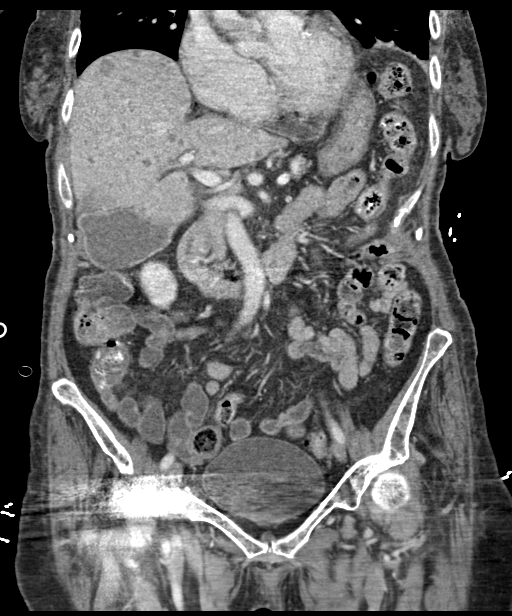
[im 67/111  soft-tissue]
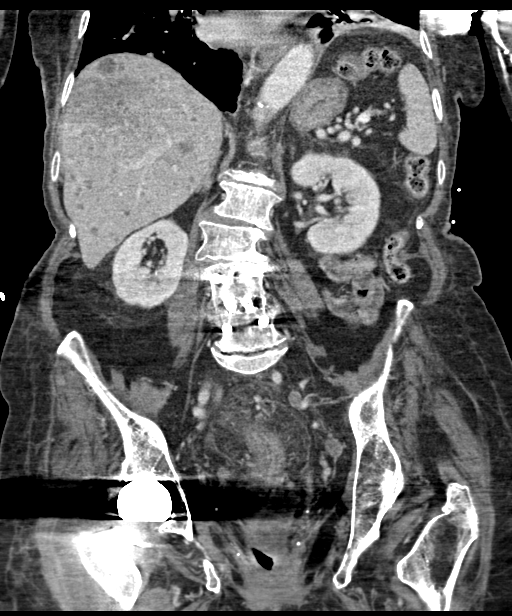

[15 of 46 positions shown; findings below may reference images not displayed]

RADIATION DOSE REDUCTION: This exam was performed according to the
departmental dose-optimization program which includes automated
exposure control, adjustment of the mA and/or kV according to
patient size and/or use of iterative reconstruction technique.

CONTRAST:  100mL OMNIPAQUE IOHEXOL 300 MG/ML  SOLN
FINDINGS: Lower chest: New or enlarging basilar pulmonary nodules measure up
to 5 mm in anterior lingula ([DATE]). Dependent atelectasis
bilaterally, left greater than right. No pleural fluid.
Atherosclerotic calcification of the aorta. Heart is enlarged. No
pericardial effusion.

Hepatobiliary: Marked increase in number of mildly hypodense lesions
throughout the liver with an index nodule in the dome measuring
cm ([DATE]). Gallbladder is unremarkable. No biliary ductal dilatation.

Pancreas: Negative.

Spleen: Negative.

Adrenals/Urinary Tract: Right adrenal gland is unremarkable.
Enlarging left adrenal nodule measures 2.0 x 2.2 cm ([DATE]),
previously 1.4 x 1.4 cm. Subcentimeter low-attenuation lesions in
the kidneys are too small to characterize. No follow-up necessary.
Kidneys are otherwise unremarkable. Ureters are decompressed.
Bladder is partially obscured by streak artifact from a right hip
arthroplasty.

Stomach/Bowel: Stomach, small bowel, appendix and majority of the
colon are unremarkable. Progressive irregular and eccentric rectal
wall thickening, difficult to accurately measure. New and enlarging
perirectal adenopathy, measuring up to 1.3 cm (2/72), previously 8
mm. Extensive perirectal edema and soft tissue thickening.

Vascular/Lymphatic: Atherosclerotic calcification of the aorta.
Similar small abdominal retroperitoneal lymph nodes. Enlarging
pelvic retroperitoneal lymph nodes. Index proximal left common iliac
lymph node measures 9 mm (2/48), previously 5 mm. Adenopathy extends
both iliac chains, left greater than right, measuring up to 1.4 cm
in the left internal iliac station (2/66), previously 9 mm.

Reproductive: Hysterectomy.  No adnexal mass.

Other: No free fluid. Mesenteries and peritoneum are otherwise
unremarkable. Left hemidiaphragm is elevated, as before.

Musculoskeletal: Right hip arthroplasty. Degenerative and
postoperative changes in the spine. Dextroconvex scoliosis of the
lumbar spine. Enlargement of a sclerotic lesion in the T11 vertebral
body.
IMPRESSION: 1. Metastatic rectal cancer as evidenced by an enlarging rectal
mass, extensive perirectal and pelvic retroperitoneal adenopathy,
enlarging left adrenal mass and new/enlarging hepatic and pulmonary
metastases. Enlarging sclerotic lesion in T11, also worrisome for
metastatic disease.
2.  Aortic atherosclerosis ([id number]).
